# Patient Record
Sex: Female | Born: 1940
Health system: Southern US, Community
[De-identification: ages and names within clinical notes are randomized; demographics above are authoritative.]

## PROBLEM LIST (undated history)

## (undated) DIAGNOSIS — R0600 Dyspnea, unspecified: Secondary | ICD-10-CM

## (undated) DIAGNOSIS — E7800 Pure hypercholesterolemia, unspecified: Secondary | ICD-10-CM

## (undated) DIAGNOSIS — I509 Heart failure, unspecified: Secondary | ICD-10-CM

## (undated) DIAGNOSIS — G4730 Sleep apnea, unspecified: Secondary | ICD-10-CM

## (undated) DIAGNOSIS — I2510 Atherosclerotic heart disease of native coronary artery without angina pectoris: Secondary | ICD-10-CM

## (undated) DIAGNOSIS — D649 Anemia, unspecified: Secondary | ICD-10-CM

## (undated) DIAGNOSIS — R569 Unspecified convulsions: Secondary | ICD-10-CM

## (undated) DIAGNOSIS — I10 Essential (primary) hypertension: Secondary | ICD-10-CM

## (undated) DIAGNOSIS — E039 Hypothyroidism, unspecified: Secondary | ICD-10-CM

## (undated) DIAGNOSIS — N289 Disorder of kidney and ureter, unspecified: Secondary | ICD-10-CM

## (undated) DIAGNOSIS — K219 Gastro-esophageal reflux disease without esophagitis: Secondary | ICD-10-CM

## (undated) DIAGNOSIS — M1990 Unspecified osteoarthritis, unspecified site: Secondary | ICD-10-CM

## (undated) HISTORY — PX: BRAIN SURGERY: SHX531

## (undated) HISTORY — PX: ABDOMINAL HYSTERECTOMY: SHX81

---
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
## 2019-09-21 ENCOUNTER — Other Ambulatory Visit: Payer: Self-pay

## 2019-09-21 ENCOUNTER — Inpatient Hospital Stay (HOSPITAL_COMMUNITY)
Admission: EM | Admit: 2019-09-21 | Discharge: 2019-10-07 | DRG: 673 | Disposition: A | Discharge status: Home Health | Payer: Medicare Other | Attending: Family Medicine | Admitting: Family Medicine

## 2019-09-21 ENCOUNTER — Encounter (HOSPITAL_COMMUNITY): Payer: Self-pay | Admitting: Emergency Medicine

## 2019-09-21 DIAGNOSIS — E875 Hyperkalemia: Secondary | ICD-10-CM | POA: Diagnosis present

## 2019-09-21 DIAGNOSIS — R601 Generalized edema: Secondary | ICD-10-CM | POA: Diagnosis not present

## 2019-09-21 DIAGNOSIS — C9000 Multiple myeloma not having achieved remission: Secondary | ICD-10-CM

## 2019-09-21 DIAGNOSIS — R945 Abnormal results of liver function studies: Secondary | ICD-10-CM

## 2019-09-21 DIAGNOSIS — E039 Hypothyroidism, unspecified: Secondary | ICD-10-CM | POA: Diagnosis present

## 2019-09-21 DIAGNOSIS — R768 Other specified abnormal immunological findings in serum: Secondary | ICD-10-CM

## 2019-09-21 DIAGNOSIS — D8989 Other specified disorders involving the immune mechanism, not elsewhere classified: Secondary | ICD-10-CM

## 2019-09-21 DIAGNOSIS — N179 Acute kidney failure, unspecified: Principal | ICD-10-CM | POA: Diagnosis present

## 2019-09-21 DIAGNOSIS — E7800 Pure hypercholesterolemia, unspecified: Secondary | ICD-10-CM | POA: Diagnosis present

## 2019-09-21 DIAGNOSIS — I1310 Hypertensive heart and chronic kidney disease without heart failure, with stage 1 through stage 4 chronic kidney disease, or unspecified chronic kidney disease: Secondary | ICD-10-CM

## 2019-09-21 DIAGNOSIS — Z95828 Presence of other vascular implants and grafts: Secondary | ICD-10-CM

## 2019-09-21 DIAGNOSIS — Z419 Encounter for procedure for purposes other than remedying health state, unspecified: Secondary | ICD-10-CM

## 2019-09-21 DIAGNOSIS — Z452 Encounter for adjustment and management of vascular access device: Secondary | ICD-10-CM

## 2019-09-21 DIAGNOSIS — Z6839 Body mass index (BMI) 39.0-39.9, adult: Secondary | ICD-10-CM

## 2019-09-21 DIAGNOSIS — D649 Anemia, unspecified: Secondary | ICD-10-CM | POA: Diagnosis present

## 2019-09-21 DIAGNOSIS — Z20828 Contact with and (suspected) exposure to other viral communicable diseases: Secondary | ICD-10-CM | POA: Diagnosis present

## 2019-09-21 DIAGNOSIS — D539 Nutritional anemia, unspecified: Secondary | ICD-10-CM | POA: Diagnosis present

## 2019-09-21 DIAGNOSIS — G40909 Epilepsy, unspecified, not intractable, without status epilepticus: Secondary | ICD-10-CM | POA: Diagnosis present

## 2019-09-21 DIAGNOSIS — D631 Anemia in chronic kidney disease: Secondary | ICD-10-CM | POA: Diagnosis present

## 2019-09-21 DIAGNOSIS — J90 Pleural effusion, not elsewhere classified: Secondary | ICD-10-CM

## 2019-09-21 DIAGNOSIS — K219 Gastro-esophageal reflux disease without esophagitis: Secondary | ICD-10-CM | POA: Diagnosis present

## 2019-09-21 DIAGNOSIS — Z9889 Other specified postprocedural states: Secondary | ICD-10-CM

## 2019-09-21 DIAGNOSIS — G4733 Obstructive sleep apnea (adult) (pediatric): Secondary | ICD-10-CM | POA: Diagnosis present

## 2019-09-21 DIAGNOSIS — N184 Chronic kidney disease, stage 4 (severe): Secondary | ICD-10-CM | POA: Diagnosis present

## 2019-09-21 DIAGNOSIS — Z79899 Other long term (current) drug therapy: Secondary | ICD-10-CM

## 2019-09-21 DIAGNOSIS — I959 Hypotension, unspecified: Secondary | ICD-10-CM | POA: Diagnosis present

## 2019-09-21 DIAGNOSIS — Z90710 Acquired absence of both cervix and uterus: Secondary | ICD-10-CM

## 2019-09-21 DIAGNOSIS — Y832 Surgical operation with anastomosis, bypass or graft as the cause of abnormal reaction of the patient, or of later complication, without mention of misadventure at the time of the procedure: Secondary | ICD-10-CM | POA: Diagnosis not present

## 2019-09-21 DIAGNOSIS — E785 Hyperlipidemia, unspecified: Secondary | ICD-10-CM | POA: Diagnosis present

## 2019-09-21 DIAGNOSIS — D72829 Elevated white blood cell count, unspecified: Secondary | ICD-10-CM

## 2019-09-21 DIAGNOSIS — K761 Chronic passive congestion of liver: Secondary | ICD-10-CM | POA: Diagnosis present

## 2019-09-21 DIAGNOSIS — I130 Hypertensive heart and chronic kidney disease with heart failure and stage 1 through stage 4 chronic kidney disease, or unspecified chronic kidney disease: Secondary | ICD-10-CM | POA: Diagnosis present

## 2019-09-21 DIAGNOSIS — I509 Heart failure, unspecified: Secondary | ICD-10-CM

## 2019-09-21 DIAGNOSIS — E43 Unspecified severe protein-calorie malnutrition: Secondary | ICD-10-CM | POA: Diagnosis present

## 2019-09-21 DIAGNOSIS — T82868A Thrombosis of vascular prosthetic devices, implants and grafts, initial encounter: Secondary | ICD-10-CM | POA: Diagnosis not present

## 2019-09-21 DIAGNOSIS — J9601 Acute respiratory failure with hypoxia: Secondary | ICD-10-CM | POA: Diagnosis not present

## 2019-09-21 DIAGNOSIS — L03113 Cellulitis of right upper limb: Secondary | ICD-10-CM | POA: Diagnosis not present

## 2019-09-21 DIAGNOSIS — I2510 Atherosclerotic heart disease of native coronary artery without angina pectoris: Secondary | ICD-10-CM | POA: Diagnosis present

## 2019-09-21 DIAGNOSIS — I5033 Acute on chronic diastolic (congestive) heart failure: Secondary | ICD-10-CM

## 2019-09-21 DIAGNOSIS — N189 Chronic kidney disease, unspecified: Secondary | ICD-10-CM

## 2019-09-21 HISTORY — DX: Sleep apnea, unspecified: G47.30

## 2019-09-21 HISTORY — DX: Unspecified convulsions: R56.9

## 2019-09-21 HISTORY — DX: Hypothyroidism, unspecified: E03.9

## 2019-09-21 HISTORY — DX: Heart failure, unspecified: I50.9

## 2019-09-21 HISTORY — DX: Atherosclerotic heart disease of native coronary artery without angina pectoris: I25.10

## 2019-09-21 HISTORY — DX: Essential (primary) hypertension: I10

## 2019-09-21 HISTORY — DX: Disorder of kidney and ureter, unspecified: N28.9

## 2019-09-21 HISTORY — DX: Pure hypercholesterolemia, unspecified: E78.00

## 2019-09-21 NOTE — ED Triage Notes (Signed)
Per daughter pt sent from PCP for 30lb weight gain and "kidney failure." PCP stopped torsemide and losartan due to pt's kidney failure per daughter.

## 2019-09-22 ENCOUNTER — Inpatient Hospital Stay (HOSPITAL_COMMUNITY): Payer: Medicare Other

## 2019-09-22 ENCOUNTER — Encounter (HOSPITAL_COMMUNITY): Payer: Self-pay | Admitting: Internal Medicine

## 2019-09-22 ENCOUNTER — Emergency Department (HOSPITAL_COMMUNITY): Payer: Medicare Other

## 2019-09-22 DIAGNOSIS — E875 Hyperkalemia: Secondary | ICD-10-CM | POA: Diagnosis present

## 2019-09-22 DIAGNOSIS — R945 Abnormal results of liver function studies: Secondary | ICD-10-CM | POA: Diagnosis present

## 2019-09-22 DIAGNOSIS — I2510 Atherosclerotic heart disease of native coronary artery without angina pectoris: Secondary | ICD-10-CM | POA: Diagnosis present

## 2019-09-22 DIAGNOSIS — K761 Chronic passive congestion of liver: Secondary | ICD-10-CM | POA: Diagnosis present

## 2019-09-22 DIAGNOSIS — I509 Heart failure, unspecified: Secondary | ICD-10-CM | POA: Diagnosis not present

## 2019-09-22 DIAGNOSIS — I952 Hypotension due to drugs: Secondary | ICD-10-CM | POA: Diagnosis not present

## 2019-09-22 DIAGNOSIS — Z6839 Body mass index (BMI) 39.0-39.9, adult: Secondary | ICD-10-CM | POA: Diagnosis not present

## 2019-09-22 DIAGNOSIS — D649 Anemia, unspecified: Secondary | ICD-10-CM | POA: Diagnosis present

## 2019-09-22 DIAGNOSIS — T82868A Thrombosis of vascular prosthetic devices, implants and grafts, initial encounter: Secondary | ICD-10-CM | POA: Diagnosis not present

## 2019-09-22 DIAGNOSIS — E785 Hyperlipidemia, unspecified: Secondary | ICD-10-CM | POA: Diagnosis present

## 2019-09-22 DIAGNOSIS — I1310 Hypertensive heart and chronic kidney disease without heart failure, with stage 1 through stage 4 chronic kidney disease, or unspecified chronic kidney disease: Secondary | ICD-10-CM | POA: Diagnosis not present

## 2019-09-22 DIAGNOSIS — Z79899 Other long term (current) drug therapy: Secondary | ICD-10-CM | POA: Diagnosis not present

## 2019-09-22 DIAGNOSIS — C9000 Multiple myeloma not having achieved remission: Secondary | ICD-10-CM | POA: Diagnosis present

## 2019-09-22 DIAGNOSIS — E7800 Pure hypercholesterolemia, unspecified: Secondary | ICD-10-CM | POA: Diagnosis present

## 2019-09-22 DIAGNOSIS — I5033 Acute on chronic diastolic (congestive) heart failure: Secondary | ICD-10-CM | POA: Diagnosis present

## 2019-09-22 DIAGNOSIS — J90 Pleural effusion, not elsewhere classified: Secondary | ICD-10-CM | POA: Diagnosis not present

## 2019-09-22 DIAGNOSIS — G40909 Epilepsy, unspecified, not intractable, without status epilepticus: Secondary | ICD-10-CM | POA: Diagnosis present

## 2019-09-22 DIAGNOSIS — I340 Nonrheumatic mitral (valve) insufficiency: Secondary | ICD-10-CM | POA: Diagnosis not present

## 2019-09-22 DIAGNOSIS — E43 Unspecified severe protein-calorie malnutrition: Secondary | ICD-10-CM | POA: Diagnosis present

## 2019-09-22 DIAGNOSIS — G4733 Obstructive sleep apnea (adult) (pediatric): Secondary | ICD-10-CM | POA: Diagnosis present

## 2019-09-22 DIAGNOSIS — N185 Chronic kidney disease, stage 5: Secondary | ICD-10-CM | POA: Diagnosis not present

## 2019-09-22 DIAGNOSIS — L03113 Cellulitis of right upper limb: Secondary | ICD-10-CM | POA: Diagnosis not present

## 2019-09-22 DIAGNOSIS — E039 Hypothyroidism, unspecified: Secondary | ICD-10-CM | POA: Diagnosis present

## 2019-09-22 DIAGNOSIS — J9601 Acute respiratory failure with hypoxia: Secondary | ICD-10-CM | POA: Diagnosis not present

## 2019-09-22 DIAGNOSIS — R601 Generalized edema: Secondary | ICD-10-CM | POA: Diagnosis present

## 2019-09-22 DIAGNOSIS — Z20828 Contact with and (suspected) exposure to other viral communicable diseases: Secondary | ICD-10-CM | POA: Diagnosis present

## 2019-09-22 DIAGNOSIS — R609 Edema, unspecified: Secondary | ICD-10-CM | POA: Diagnosis not present

## 2019-09-22 DIAGNOSIS — I130 Hypertensive heart and chronic kidney disease with heart failure and stage 1 through stage 4 chronic kidney disease, or unspecified chronic kidney disease: Secondary | ICD-10-CM | POA: Diagnosis present

## 2019-09-22 DIAGNOSIS — D631 Anemia in chronic kidney disease: Secondary | ICD-10-CM | POA: Diagnosis present

## 2019-09-22 DIAGNOSIS — I50811 Acute right heart failure: Secondary | ICD-10-CM | POA: Diagnosis not present

## 2019-09-22 DIAGNOSIS — N189 Chronic kidney disease, unspecified: Secondary | ICD-10-CM | POA: Diagnosis not present

## 2019-09-22 DIAGNOSIS — D539 Nutritional anemia, unspecified: Secondary | ICD-10-CM | POA: Diagnosis present

## 2019-09-22 DIAGNOSIS — Y832 Surgical operation with anastomosis, bypass or graft as the cause of abnormal reaction of the patient, or of later complication, without mention of misadventure at the time of the procedure: Secondary | ICD-10-CM | POA: Diagnosis not present

## 2019-09-22 DIAGNOSIS — N179 Acute kidney failure, unspecified: Secondary | ICD-10-CM

## 2019-09-22 DIAGNOSIS — N184 Chronic kidney disease, stage 4 (severe): Secondary | ICD-10-CM | POA: Diagnosis not present

## 2019-09-22 DIAGNOSIS — K219 Gastro-esophageal reflux disease without esophagitis: Secondary | ICD-10-CM | POA: Diagnosis present

## 2019-09-22 DIAGNOSIS — I959 Hypotension, unspecified: Secondary | ICD-10-CM | POA: Diagnosis present

## 2019-09-22 DIAGNOSIS — N186 End stage renal disease: Secondary | ICD-10-CM | POA: Diagnosis not present

## 2019-09-22 LAB — COMPREHENSIVE METABOLIC PANEL
ALT: 47 U/L — ABNORMAL HIGH (ref 0–44)
ALT: 45 U/L — ABNORMAL HIGH (ref 0–44)
AST: 76 U/L — ABNORMAL HIGH (ref 15–41)
AST: 73 U/L — ABNORMAL HIGH (ref 15–41)
Albumin: 2.4 g/dL — ABNORMAL LOW (ref 3.5–5.0)
Albumin: 2.2 g/dL — ABNORMAL LOW (ref 3.5–5.0)
Alkaline Phosphatase: 145 U/L — ABNORMAL HIGH (ref 38–126)
Alkaline Phosphatase: 140 U/L — ABNORMAL HIGH (ref 38–126)
Anion gap: 13 (ref 5–15)
Anion gap: 14 (ref 5–15)
BUN: 74 mg/dL — ABNORMAL HIGH (ref 8–23)
BUN: 78 mg/dL — ABNORMAL HIGH (ref 8–23)
CO2: 19 mmol/L — ABNORMAL LOW (ref 22–32)
CO2: 19 mmol/L — ABNORMAL LOW (ref 22–32)
Calcium: 7.6 mg/dL — ABNORMAL LOW (ref 8.9–10.3)
Calcium: 7.6 mg/dL — ABNORMAL LOW (ref 8.9–10.3)
Chloride: 104 mmol/L (ref 98–111)
Chloride: 103 mmol/L (ref 98–111)
Creatinine, Ser: 4.85 mg/dL — ABNORMAL HIGH (ref 0.44–1.00)
Creatinine, Ser: 4.88 mg/dL — ABNORMAL HIGH (ref 0.44–1.00)
GFR calc Af Amer: 9 mL/min — ABNORMAL LOW (ref >60)
GFR calc Af Amer: 9 mL/min — ABNORMAL LOW (ref >60)
GFR calc non Af Amer: 8 mL/min — ABNORMAL LOW (ref >60)
GFR calc non Af Amer: 8 mL/min — ABNORMAL LOW (ref >60)
Glucose, Bld: 110 mg/dL — ABNORMAL HIGH (ref 70–99)
Glucose, Bld: 106 mg/dL — ABNORMAL HIGH (ref 70–99)
Potassium: 4.8 mmol/L (ref 3.5–5.1)
Potassium: 4.9 mmol/L (ref 3.5–5.1)
Sodium: 136 mmol/L (ref 135–145)
Sodium: 136 mmol/L (ref 135–145)
Total Bilirubin: 0.4 mg/dL (ref 0.3–1.2)
Total Bilirubin: 0.3 mg/dL (ref 0.3–1.2)
Total Protein: 5.3 g/dL — ABNORMAL LOW (ref 6.5–8.1)
Total Protein: 5 g/dL — ABNORMAL LOW (ref 6.5–8.1)

## 2019-09-22 LAB — CBC
HCT: 29.6 % — ABNORMAL LOW (ref 36.0–46.0)
HCT: 30.8 % — ABNORMAL LOW (ref 36.0–46.0)
Hemoglobin: 9.8 g/dL — ABNORMAL LOW (ref 12.0–15.0)
Hemoglobin: 9.7 g/dL — ABNORMAL LOW (ref 12.0–15.0)
MCH: 33 pg (ref 26.0–34.0)
MCH: 31.9 pg (ref 26.0–34.0)
MCHC: 33.1 g/dL (ref 30.0–36.0)
MCHC: 31.5 g/dL (ref 30.0–36.0)
MCV: 99.7 fL (ref 80.0–100.0)
MCV: 101.3 fL — ABNORMAL HIGH (ref 80.0–100.0)
Platelets: 193 10*3/uL (ref 150–400)
Platelets: 178 10*3/uL (ref 150–400)
RBC: 2.97 MIL/uL — ABNORMAL LOW (ref 3.87–5.11)
RBC: 3.04 MIL/uL — ABNORMAL LOW (ref 3.87–5.11)
RDW: 15.4 % (ref 11.5–15.5)
RDW: 15.3 % (ref 11.5–15.5)
WBC: 9.9 10*3/uL (ref 4.0–10.5)
WBC: 8.8 10*3/uL (ref 4.0–10.5)
nRBC: 0 % (ref 0.0–0.2)
nRBC: 0 % (ref 0.0–0.2)

## 2019-09-22 LAB — URINALYSIS, COMPLETE (UACMP) WITH MICROSCOPIC
Bilirubin Urine: NEGATIVE
Glucose, UA: NEGATIVE mg/dL
Ketones, ur: NEGATIVE mg/dL
Leukocytes,Ua: NEGATIVE
Nitrite: NEGATIVE
Protein, ur: >=300 mg/dL — AB
Specific Gravity, Urine: 1.023 (ref 1.005–1.030)
pH: 5 (ref 5.0–8.0)

## 2019-09-22 LAB — IRON AND TIBC
Iron: 57 ug/dL (ref 28–170)
Saturation Ratios: 27 % (ref 10.4–31.8)
TIBC: 208 ug/dL — ABNORMAL LOW (ref 250–450)
UIBC: 151 ug/dL

## 2019-09-22 LAB — PROTEIN / CREATININE RATIO, URINE
Creatinine, Urine: 205.93 mg/dL
Protein Creatinine Ratio: 10.1 mg/mg{Cre} — ABNORMAL HIGH (ref 0.00–0.15)
Total Protein, Urine: 2079 mg/dL

## 2019-09-22 LAB — CREATININE, URINE, RANDOM: Creatinine, Urine: 203.98 mg/dL

## 2019-09-22 LAB — CORTISOL: Cortisol, Plasma: 18.9 ug/dL

## 2019-09-22 LAB — ECHOCARDIOGRAM COMPLETE
Height: 68 in
Weight: 4096 oz

## 2019-09-22 LAB — SODIUM, URINE, RANDOM: Sodium, Ur: 14 mmol/L

## 2019-09-22 LAB — TSH
TSH: 6.776 u[IU]/mL — ABNORMAL HIGH (ref 0.350–4.500)
TSH: 6.835 u[IU]/mL — ABNORMAL HIGH (ref 0.350–4.500)

## 2019-09-22 LAB — SARS CORONAVIRUS 2 (TAT 6-24 HRS): SARS Coronavirus 2: NEGATIVE

## 2019-09-22 LAB — BRAIN NATRIURETIC PEPTIDE: B Natriuretic Peptide: 1148 pg/mL — ABNORMAL HIGH (ref 0.0–100.0)

## 2019-09-22 LAB — VITAMIN B12: Vitamin B-12: 696 pg/mL (ref 180–914)

## 2019-09-22 LAB — FERRITIN: Ferritin: 262 ng/mL (ref 11–307)

## 2019-09-22 MED ORDER — SODIUM CHLORIDE 0.9% FLUSH
3.0000 mL | Freq: Two times a day (BID) | INTRAVENOUS | Status: DC
  Administered 2019-09-22 – 2019-09-23 (×3): 3 mL via INTRAVENOUS
  Administered 2019-09-23: 10 mL via INTRAVENOUS
  Administered 2019-09-24 – 2019-10-06 (×25): 3 mL via INTRAVENOUS

## 2019-09-22 MED ORDER — SODIUM CHLORIDE 0.9% FLUSH: 3.0000 mL | INTRAVENOUS | Status: DC | PRN

## 2019-09-22 MED ORDER — HEPARIN SODIUM (PORCINE) 5000 UNIT/ML IJ SOLN
5000.0000 [IU] | Freq: Three times a day (TID) | INTRAMUSCULAR | Status: DC
  Administered 2019-09-22 – 2019-10-07 (×43): 5000 [IU] via SUBCUTANEOUS
  Filled 2019-09-22 (×44): qty 1

## 2019-09-22 MED ORDER — FUROSEMIDE 10 MG/ML IJ SOLN
100.0000 mg | Freq: Once | INTRAMUSCULAR | Status: AC
  Administered 2019-09-22: 100 mg via INTRAVENOUS
  Filled 2019-09-22: qty 12

## 2019-09-22 MED ORDER — NITROGLYCERIN 2 % TD OINT
0.5000 [in_us] | TOPICAL_OINTMENT | Freq: Once | TRANSDERMAL | Status: AC
  Administered 2019-09-22: 0.5 [in_us] via TOPICAL
  Filled 2019-09-22: qty 1

## 2019-09-22 MED ORDER — PHENYTOIN SODIUM EXTENDED 100 MG PO CAPS
300.0000 mg | ORAL_CAPSULE | Freq: Every day | ORAL | Status: DC
  Administered 2019-09-22 – 2019-10-06 (×15): 300 mg via ORAL
  Filled 2019-09-22 (×16): qty 3

## 2019-09-22 MED ORDER — PRO-STAT SUGAR FREE PO LIQD
30.0000 mL | Freq: Two times a day (BID) | ORAL | Status: DC
  Administered 2019-09-22 – 2019-10-06 (×24): 30 mL via ORAL
  Filled 2019-09-22 (×25): qty 30

## 2019-09-22 MED ORDER — SODIUM CHLORIDE 0.9 % IV SOLN
250.0000 mL | INTRAVENOUS | Status: DC | PRN
  Administered 2019-09-30 – 2019-10-06 (×8): 250 mL via INTRAVENOUS
  Administered 2019-10-07: 11:00:00 via INTRAVENOUS

## 2019-09-22 NOTE — H&P (Signed)
TRH H&P    Patient Demographics:    Sophia Mcmahon, is a 78 y.o. female  MRN: [id number]  DOB - 1941-11-26  Admit Date - 09/21/2019  Referring MD/NP/PA:  Rolland Porter  Outpatient Primary MD for the patient is Frances Maywood, FNP  Patient coming from:  home  Chief complaint-  ARF   HPI:    Sophia Mcmahon  is a 78 y.o. female, w hypertension, hyperlipidemia, CKD stage4, Hypothyroidism, Seizure do, presents with c/o weight gain and swelling for the past 4 weeks. Pt notes dietary indiscretion. Pt has been exercising less.  Pt was told to STOP Torsemide and Losartan by PCP and to go to ER due to acute renal failure.    In ED T 97.5, P 59 R 22, Bp 111/47  Pox 94% on RA Wt 116.1kg  CXR IMPRESSION: CHF. Cardiomegaly with moderate pleural effusions and pulmonary edema.  Wbc 9.9, Hgb 9.8, Plt 193 Na 136, K 4.8, Bun 74, Creatinie 4.85 Alb 2.4 Ast 76, Alt 47, Alk phos 145, T. Bili 0.4 BNP 1,148  Pt will be admitted for ARF, Anasarca, and severe protein calorie malnutrition and ? CHF     Review of systems:    In addition to the HPI above,  No Fever-chills, No Headache, No changes with Vision or hearing, No problems swallowing food or Liquids, No Chest pain, Cough or Shortness of Breath, No Abdominal pain, No Nausea or Vomiting, bowel movements are regular, No Blood in stool or Urine, No dysuria, No new skin rashes or bruises, No new joints pains-aches,  No new weakness, tingling, numbness in any extremity, No recent weight gain or loss, No polyuria, polydypsia or polyphagia, No significant Mental Stressors.  All other systems reviewed and are negative.    Past History of the following :    Past Medical History:  Diagnosis Date   High cholesterol    Hypertension    Renal disorder    Seizures ([place])       History reviewed. No pertinent surgical history.    Social History:      Social History   Tobacco Use   Smoking status: Never Smoker   Smokeless tobacco: Never Used  Substance Use Topics   Alcohol use: Never    Frequency: Never       Family History :    No family history on file. Pt is unable to provide   Home Medications:   Prior to Admission medications   Not on File  Unknown other than Losartan and Torsemide   Allergies:     Allergies  Allergen Reactions   Sulfa Antibiotics Hives     Physical Exam:   Vitals  Blood pressure (!) 97/44, pulse (!) 56, temperature (!) 97.5 F (36.4 C), temperature source Oral, resp. rate (!) 22, weight 116.1 kg, SpO2 90 %.  1.  General: Axox2 (person, place)  2. Psychiatric: euthymic  3. Neurologic: Nonfocal, cn2-12 intact, reflexes 2+ symmetric, diffuse with no clonus, motor 5/5 in all 4 ext  4. HEENMT:  Anicteric, pupils  1.58m symmetric, direct, consensual, near intact Neck: no jvd  5. Respiratory : CTAB  6. Cardiovascular : rrr s1, s2,   7. Gastrointestinal:  Abd: soft, nt, nd, +bs  8. Skin:  Ext: no c/c/  1+ edema  9.Musculoskeletal:  Good ROM    Data Review:    CBC Recent Labs  Lab 09/22/19 0121 09/22/19 0422  WBC 9.9 8.8  HGB 9.8* 9.7*  HCT 29.6* 30.8*  PLT 193 178  MCV 99.7 101.3*  MCH 33.0 31.9  MCHC 33.1 31.5  RDW 15.4 15.3   ------------------------------------------------------------------------------------------------------------------  Results for orders placed or performed during the hospital encounter of 09/21/19 (from the past 48 hour(s))  CBC     Status: Abnormal   Collection Time: 09/22/19  1:21 AM  Result Value Ref Range   WBC 9.9 4.0 - 10.5 K/uL   RBC 2.97 (L) 3.87 - 5.11 MIL/uL   Hemoglobin 9.8 (L) 12.0 - 15.0 g/dL   HCT 29.6 (L) 36.0 - 46.0 %   MCV 99.7 80.0 - 100.0 fL   MCH 33.0 26.0 - 34.0 pg   MCHC 33.1 30.0 - 36.0 g/dL   RDW 15.4 11.5 - 15.5 %   Platelets 193 150 - 400 K/uL   nRBC 0.0 0.0 - 0.2 %    Comment: [facility] [address], [place] [address] Comprehensive metabolic panel     Status: Abnormal   Collection Time: 09/22/19  1:21 AM  Result Value Ref Range   Sodium 136 135 - 145 mmol/L   Potassium 4.8 3.5 - 5.1 mmol/L   Chloride 104 98 - 111 mmol/L   CO2 19 (L) 22 - 32 mmol/L   Glucose, Bld 110 (H) 70 - 99 mg/dL   BUN 74 (H) 8 - 23 mg/dL   Creatinine, Ser 4.85 (H) 0.44 - 1.00 mg/dL   Calcium 7.6 (L) 8.9 - 10.3 mg/dL   Total Protein 5.3 (L) 6.5 - 8.1 g/dL   Albumin 2.4 (L) 3.5 - 5.0 g/dL   AST 76 (H) 15 - 41 U/L   ALT 47 (H) 0 - 44 U/L   Alkaline Phosphatase 145 (H) 38 - 126 U/L   Total Bilirubin 0.4 0.3 - 1.2 mg/dL   GFR calc non Af Amer 8 (L) >60 mL/min   GFR calc Af Amer 9 (L) >60 mL/min   Anion gap 13 5 - 15    Comment: [facility] [address], [place] [address] Brain natriuretic peptide     Status: Abnormal   Collection Time: 09/22/19  1:21 AM  Result Value Ref Range   B Natriuretic Peptide 1,148.0 (H) 0.0 - 100.0 pg/mL    Comment: [facility] at [facility] [address] [place] [address] CBC     Status: Abnormal   Collection Time: 09/22/19  4:22 AM  Result Value Ref Range   WBC 8.8 4.0 - 10.5 K/uL   RBC 3.04 (L) 3.87 - 5.11 MIL/uL   Hemoglobin 9.7 (L) 12.0 - 15.0 g/dL   HCT 30.8 (L) 36.0 - 46.0 %   MCV 101.3 (H) 80.0 - 100.0 fL   MCH 31.9 26.0 - 34.0 pg   MCHC 31.5 30.0 - 36.0 g/dL   RDW 15.3 11.5 - 15.5 %   Platelets 178 150 - 400 K/uL   nRBC 0.0 0.0 - 0.2 %    Comment: [facility] at [facility] [address], [place] [address]  Chemistries  Recent Labs  Lab 09/22/19 0121  NA 136  K 4.8  CL 104  CO2 19*  GLUCOSE 110*  BUN 74*  CREATININE 4.85*  CALCIUM 7.6*  AST 76*  ALT 47*  ALKPHOS 145*  BILITOT 0.4    ------------------------------------------------------------------------------------------------------------------  ------------------------------------------------------------------------------------------------------------------ GFR: CrCl cannot be calculated (Unknown ideal weight.). Liver Function Tests: Recent Labs  Lab 09/22/19 0121  AST 76*  ALT 47*  ALKPHOS 145*  BILITOT 0.4  PROT 5.3*  ALBUMIN 2.4*   No results for input(s): LIPASE, AMYLASE in the last 168 hours. No results for input(s): AMMONIA in the last 168 hours. Coagulation Profile: No results for input(s): INR, PROTIME in the last 168 hours. Cardiac Enzymes: No results for input(s): CKTOTAL, CKMB, CKMBINDEX, TROPONINI in the last 168 hours. BNP (last 3 results) No results for input(s): PROBNP in the last 8760 hours. HbA1C: No results for input(s): HGBA1C in the last 72 hours. CBG: No results for input(s): GLUCAP in the last 168 hours. Lipid Profile: No results for input(s): CHOL, HDL, LDLCALC, TRIG, CHOLHDL, LDLDIRECT in the last 72 hours. Thyroid Function Tests: No results for input(s): TSH, T4TOTAL, FREET4, T3FREE, THYROIDAB in the last 72 hours. Anemia Panel: No results for input(s): VITAMINB12, FOLATE, FERRITIN, TIBC, IRON, RETICCTPCT in the last 72 hours.  --------------------------------------------------------------------------------------------------------------- Urine analysis: No results found for: COLORURINE, APPEARANCEUR, LABSPEC, PHURINE, GLUCOSEU, HGBUR, BILIRUBINUR, KETONESUR, PROTEINUR, UROBILINOGEN, NITRITE, LEUKOCYTESUR    Imaging Results:    Dg Chest Port 1 View  Result Date: 09/22/2019 CLINICAL DATA:  Shortness of breath. History of CHF, stopped diuretics. 30 pound weight gain. EXAM: PORTABLE CHEST 1 VIEW COMPARISON:  None. FINDINGS: Cardiomegaly. There is moderate pulmonary edema. Hazy lung base opacities likely combination of pleural effusions and compressive atelectasis. No  pneumothorax or confluent airspace disease in the aerated lungs. Advanced degenerative change of both shoulders. IMPRESSION: CHF. Cardiomegaly with moderate pleural effusions and pulmonary edema. Electronically Signed   By: Keith Rake M.D.   On: 09/22/2019 00:40   nsr at 24, LAD, Q in v1-3, biphasic t in v5,6    Assessment & Plan:    Principal Problem:   ARF (acute renal failure) (HCC) Active Problems:   Protein-calorie malnutrition, severe (HCC)   Abnormal liver function   Anemia  ARF Check urine sodium, urine prot/creatinine , urine eosinophils Check spep, immunofixation Hydrate very gently  Check cmp in am  Abnormal liver function Check acute hepatitis panel Check abdominal ultrasound Check cmp in am  Hypertension, CKD stage4 Please find out her medications in AM Hydralazine 16m iv q6h prn sbp >160 STOP Torsemide STOP Losartan  Severe protein calorie malnutrition prostat 344mpo bid  Hypothyroidism Start levothyroxine 25 micrograms po qday  Anemia Check ferritin, iron, tibc, b12, folate Check cbc in am    DVT Prophylaxis-   heparin- SCDs   AM Labs Ordered, also please review Full Orders  Family Communication: Admission, patients condition and plan of care including tests being ordered have been discussed with the patient  who indicate understanding and agree with the plan and Code Status.  Code Status:  FULL CODE per patient and daughter  Admission status:   Inpatient: Based on patients clinical presentation and evaluation of above clinical data, I have made determination that patient meets Inpatient criteria at this time.  Pt will require evaluation of ARF, creatinine 4.88, as well as abnormal liver function.  Pt will require > 2 nites stay.    Time spent in minutes : 70  Jani Gravel M.D on 09/22/2019 at 4:42 AM

## 2019-09-22 NOTE — Progress Notes (Signed)
*  PRELIMINARY RESULTS* Echocardiogram 2D Echocardiogram has been performed.  Sophia Mcmahon 09/22/2019, 2:31 PM

## 2019-09-22 NOTE — Progress Notes (Signed)
Has not voided all day.  Foley inserted at 1500 and returned 100 mls  To start 24 hour urine.

## 2019-09-22 NOTE — Progress Notes (Addendum)
Questioned patient's level of care d/t concerns regarding her Creatinine of 5.88, BNP 1100, BPs that have been slightly hypotensive with MAPs below 65, the administration of 100mg  of IV Lasixs with no urine output reported per patient or per her chart, and tachypnea.  This was addressed with the Baldo Ash, ED RN, who felt that the patient was stable enough for transfer to tele bed.  Dr. Dyann Kief texted regarded concerns.

## 2019-09-22 NOTE — ED Provider Notes (Signed)
[facility] EMERGENCY DEPARTMENT Provider Note   CSN: [id number] Arrival date & time: 09/21/19  2214   Time seen 12:38 AM  History   Chief Complaint Chief Complaint  Patient presents with   Shortness of Breath    HPI Sophia Mcmahon is a 78 y.o. female.   Level 5 caveat for confusion per daughter  HPI daughter states patient started gaining fluid over the past month and she estimates she has gained about 30 pounds.  She noticed her starting to get shortness of breath on September 25 and she was seen by her primary care doctor on the 26.  He sent blood work on her and she found out today that her BUN and creatinine were elevated and her BMP was around 1200.  Patient states she just started feeling bad today but her daughter states that she is confused.  She has not felt well in a while.  She states about a year ago her nephrologist in [place] started her on torsemide 20 mg and she dropped weight from 260 pounds down to 226 pounds and had done well until recently.  She states her mother has sort of decompensated due to the COVID and social distancing.  Patient complains of some low back pain and states she did have some diffuse stomach pains today.  She denies any nausea or vomiting although she did have diarrhea last night and only one episode today.  She has dyspnea with minimal exertion but patient denies PND.  She does wear CPAP at night.  PCP Frances Maywood, FNP Nephrology Dr Marlowe Sax in [place], [place]  Past Medical History:  Diagnosis Date   High cholesterol    Hypertension    Renal disorder    Seizures [facility])     Patient Active Problem List   Diagnosis Date Noted   ARF (acute renal failure) ([place]) 09/22/2019   Protein-calorie malnutrition, severe ([place]) 09/22/2019   Abnormal liver function 09/22/2019   Anemia 09/22/2019    History reviewed. No pertinent surgical history.   OB History   No obstetric history on file.      Home Medications    Prior to  Admission medications   Not on File    Family History No family history on file.  Social History Social History   Tobacco Use   Smoking status: Never Smoker   Smokeless tobacco: Never Used  Substance Use Topics   Alcohol use: Never    Frequency: Never   Drug use: Never  lives at home GD moved in with her 4 months ago   Allergies   Sulfa antibiotics   Review of Systems Review of Systems  All other systems reviewed and are negative.    Physical Exam Updated Vital Signs BP (!) 97/44    Pulse (!) 56    Temp (!) 97.5 F (36.4 C) (Oral)    Resp (!) 22    Wt 116.1 kg    SpO2 90%   Physical Exam Vitals signs and nursing note reviewed.  Constitutional:      General: She is not in acute distress.    Appearance: Normal appearance. She is well-developed. She is not ill-appearing or toxic-appearing.  HENT:     Head: Normocephalic and atraumatic.     Right Ear: External ear normal.     Left Ear: External ear normal.     Nose: Nose normal. No mucosal edema or rhinorrhea.     Mouth/Throat:     Mouth: Mucous membranes are dry.  Dentition: No dental abscesses.     Pharynx: No uvula swelling.  Eyes:     Extraocular Movements: Extraocular movements intact.     Conjunctiva/sclera: Conjunctivae normal.     Pupils: Pupils are equal, round, and reactive to light.  Neck:     Musculoskeletal: Full passive range of motion without pain, normal range of motion and neck supple.  Cardiovascular:     Rate and Rhythm: Normal rate and regular rhythm.     Heart sounds: Normal heart sounds. No murmur. No friction rub. No gallop.   Pulmonary:     Effort: Pulmonary effort is normal. No respiratory distress.     Breath sounds: Decreased breath sounds present. No wheezing, rhonchi or rales.  Chest:     Chest wall: No tenderness or crepitus.  Abdominal:     General: Bowel sounds are normal. There is distension.     Palpations: Abdomen is soft.     Tenderness: There is no abdominal  tenderness. There is no guarding or rebound.  Musculoskeletal: Normal range of motion.        General: No tenderness.     Right lower leg: Edema present.     Left lower leg: Edema present.     Comments: Moves all extremities well.  Patient is noted to have pitting edema of her hands, upper extremities, lower extremities at least to the knees.  She does not have any pitting of her abdominal wall.  Skin:    General: Skin is warm and dry.     Coloration: Skin is not pale.     Findings: No erythema or rash.  Neurological:     General: No focal deficit present.     Mental Status: She is alert and oriented to person, place, and time.     Cranial Nerves: No cranial nerve deficit.  Psychiatric:        Mood and Affect: Mood normal. Mood is not anxious.        Speech: Speech normal.        Behavior: Behavior normal.        Thought Content: Thought content normal.      ED Treatments / Results  Labs (all labs ordered are listed, but only abnormal results are displayed) Results for orders placed or performed during the hospital encounter of 09/21/19  CBC  Result Value Ref Range   WBC 9.9 4.0 - 10.5 K/uL   RBC 2.97 (L) 3.87 - 5.11 MIL/uL   Hemoglobin 9.8 (L) 12.0 - 15.0 g/dL   HCT 29.6 (L) 36.0 - 46.0 %   MCV 99.7 80.0 - 100.0 fL   MCH 33.0 26.0 - 34.0 pg   MCHC 33.1 30.0 - 36.0 g/dL   RDW 15.4 11.5 - 15.5 %   Platelets 193 150 - 400 K/uL   nRBC 0.0 0.0 - 0.2 %  Comprehensive metabolic panel  Result Value Ref Range   Sodium 136 135 - 145 mmol/L   Potassium 4.8 3.5 - 5.1 mmol/L   Chloride 104 98 - 111 mmol/L   CO2 19 (L) 22 - 32 mmol/L   Glucose, Bld 110 (H) 70 - 99 mg/dL   BUN 74 (H) 8 - 23 mg/dL   Creatinine, Ser 4.85 (H) 0.44 - 1.00 mg/dL   Calcium 7.6 (L) 8.9 - 10.3 mg/dL   Total Protein 5.3 (L) 6.5 - 8.1 g/dL   Albumin 2.4 (L) 3.5 - 5.0 g/dL   AST 76 (H) 15 - 41 U/L   ALT  47 (H) 0 - 44 U/L   Alkaline Phosphatase 145 (H) 38 - 126 U/L   Total Bilirubin 0.4 0.3 - 1.2 mg/dL    GFR calc non Af Amer 8 (L) >60 mL/min   GFR calc Af Amer 9 (L) >60 mL/min   Anion gap 13 5 - 15  Brain natriuretic peptide  Result Value Ref Range   B Natriuretic Peptide 1,148.0 (H) 0.0 - 100.0 pg/mL    Laboratory interpretation all normal except anemia, marked renal insufficiency, elevation of LFTs consistent with passive congestion, elevation of BMP   EKG EKG Interpretation  Date/Time:  Thursday September 22 2019 00:10:41 EDT Ventricular Rate:  56 PR Interval:    QRS Duration: 158 QT Interval:  558 QTC Calculation: 539 R Axis:   55 Text Interpretation:  Sinus rhythm Prolonged PR interval IVCD, consider atypical LBBB No old tracing to compare Confirmed by Rolland Porter [id number]) on 09/22/2019 12:15:04 AM   Radiology Dg Chest Port 1 View  Result Date: 09/22/2019 CLINICAL DATA:  Shortness of breath. History of CHF, stopped diuretics. 30 pound weight gain. EXAM: PORTABLE CHEST 1 VIEW COMPARISON:  None. FINDINGS: Cardiomegaly. There is moderate pulmonary edema. Hazy lung base opacities likely combination of pleural effusions and compressive atelectasis. No pneumothorax or confluent airspace disease in the aerated lungs. Advanced degenerative change of both shoulders. IMPRESSION: CHF. Cardiomegaly with moderate pleural effusions and pulmonary edema. Electronically Signed   By: Keith Rake M.D.   On: 09/22/2019 00:40    Procedures .Critical Care Performed by: Rolland Porter, MD Authorized by: Rolland Porter, MD   Critical care provider statement:    Critical care time (minutes):  31   Critical care was necessary to treat or prevent imminent or life-threatening deterioration of the following conditions:  Cardiac failure and renal failure   Critical care was time spent personally by me on the following activities:  Discussions with consultants, examination of patient, obtaining history from patient or surrogate, ordering and review of laboratory studies, ordering and review of radiographic  studies, re-evaluation of patient's condition and review of old charts   (including critical care time)          Medications Ordered in ED Medications  sodium chloride flush (NS) 0.9 % injection 3 mL (has no administration in time range)  0.9 %  sodium chloride infusion (has no administration in time range)  furosemide (LASIX) injection 100 mg (100 mg Intravenous Given 09/22/19 0304)  nitroGLYCERIN (NITROGLYN) 2 % ointment 0.5 inch (0.5 inches Topical Given 09/22/19 0304)     Initial Impression / Assessment and Plan / ED Course  I have reviewed the triage vital signs and the nursing notes.  Pertinent labs & imaging results that were available during my care of the patient were reviewed by me and considered in my medical decision making (see chart for details).       Patient presents with anasarca, after reviewing her labs she was given Lasix 100 mg IV, IV Bumex is on back order.  Her blood pressure was not extremely elevated so she was only given 1/2 inch of nitroglycerin paste.  Daughter does not know what patient's baseline creatinine was several months ago but it is worse tonight compared to this weekend of course it is a different lab that did the blood work.  Patient needs admission for diuresis and they are agreeable.  03:24 AM Dr Maudie Mercury, hospitalist, will admit for Anasarca  Final Clinical Impressions(s) / ED Diagnoses   Final diagnoses:  Anasarca  Acute renal failure superimposed on chronic kidney disease, unspecified CKD stage, unspecified acute renal failure type ([place])  Acute on chronic congestive heart failure, unspecified heart failure type [facility])    Plan admission  Rolland Porter, MD, Barbette Or, MD 09/22/19 [phone]

## 2019-09-22 NOTE — ED Notes (Signed)
ekg given to dr Tomi Bamberger

## 2019-09-22 NOTE — Plan of Care (Signed)
  Problem: Clinical Measurements: Goal: Diagnostic test results will improve Outcome: Not Progressing   Problem: Clinical Measurements: Goal: Cardiovascular complication will be avoided Outcome: Not Progressing   Problem: Activity: Goal: Risk for activity intolerance will decrease Outcome: Not Progressing   Problem: Nutrition: Goal: Adequate nutrition will be maintained Outcome: Not Progressing

## 2019-09-22 NOTE — Progress Notes (Signed)
BP  92/42 pulse 59 and right arm 89/47 pulse 58..  Talked to Dr. Edmonia James and Dr. Dyann Kief

## 2019-09-22 NOTE — Progress Notes (Signed)
Patient seen and examined.  Admitted after midnight secondary to increased weight gain and worsening renal function.  Patient blood pressure has remained soft after acute intervention of IV Lasix and nitroglycerin paste.  She denies any chest pain and is afebrile.  No nausea, no vomiting.  Has been reported since admission.  Bladder scan with 230 mL.  Please refer to H&P written by Dr. Jani Gravel further info/details on admission.  Plan: -Continue to follow renal function trend Cardiology service and nephrology service has been consulted for further assistance in patient's management; high concern for cardiorenal syndrome in the setting of acute on chronic heart failure. -If patient blood pressure remains low will require to be transferred to stepdown for initiation of milrinone and/or dopamine drip. -follow renal US -if > 260 Ml documented on repeat bladder scan will place foley.  Barton Dubois MD [phone]

## 2019-09-22 NOTE — Consult Note (Signed)
Reason for Consult: AKI Referring Physician: Dyann Kief, MD  Sophia Mcmahon is an 78 y.o. female.  HPI: [name] has a PMH significant for CAD, CHF, HLD, HTN, OSA, obesity, and CKD (unknown stage as she receives care in [place], [place]) who was noted to have a 30lb weight gain over the past 6 weeks and was seen by her PCP on 09/17/19.  Labs drawn at that time were significant for Cr of 3.8.  Her torsemide was increased and recheck on 09/21/19 revealed a Cr of 4.63.  She was told to stop her losartan and torsemide and go to the ED due to ARF but [facility] was on diversion and she was brought to [facility] ED last night.  She denies any N/V/D, or use of NSAIDs.  She does not know what her baseline Cr is but has been followed by Nephrology for the past year.  She reports her breathing is improved overnight.  She did receive IV lasix 100 mg and NTG paste in the ED which did drop her BP.  Trend in Creatinine: Creatinine, Ser  Date/Time Value Ref Range Status  09/22/2019 04:22 AM 4.88 (H) 0.44 - 1.00 mg/dL Final  09/22/2019 01:21 AM 4.85 (H) 0.44 - 1.00 mg/dL Final    PMH:   Past Medical History:  Diagnosis Date  . CHF (congestive heart failure) ([place])   . Coronary artery disease   . Dyspnea   . High cholesterol   . Hypertension   . Hypothyroidism   . Renal disorder   . Seizures ([place])   . Sleep apnea     PSH:  History reviewed. No pertinent surgical history.  Allergies:  Allergies  Allergen Reactions  . Sulfa Antibiotics Hives    Medications:   Prior to Admission medications   Not on File    Inpatient medications: . feeding supplement (PRO-STAT SUGAR FREE 64)  30 mL Oral BID  . heparin  5,000 Units Subcutaneous Q8H  . sodium chloride flush  3 mL Intravenous Q12H    Discontinued Meds:  There are no discontinued medications.  Social History:  reports that she has never smoked. She has never used smokeless tobacco. She reports that she does not drink alcohol or use drugs.  Family  History:  History reviewed. No pertinent family history.  Pertinent items are noted in HPI. Weight change:  No intake or output data in the 24 hours ending 09/22/19 1000 BP (!) 107/41 (BP Location: Left Arm)   Pulse (!) 56   Temp (!) 97.4 F (36.3 C) (Oral)   Resp (!) 24   Ht 5\' 8"  (1.727 m)   Wt 116.1 kg   SpO2 95%   BMI 38.92 kg/m  Vitals:   09/22/19 0555 09/22/19 0603 09/22/19 0613 09/22/19 0715  BP:  (!) 107/41    Pulse: 64 (!) 56    Resp: (!) 24     Temp: (!) 97.4 F (36.3 C)     TempSrc: Oral     SpO2: 95%   95%  Weight:      Height:   5\' 8"  (1.727 m)      General appearance: alert, cooperative and no distress Head: Normocephalic, without obvious abnormality, atraumatic Resp: diminished breath sounds bibasilar Cardio: regular rate and rhythm, S1, S2 normal, no murmur, click, rub or gallop GI: soft, non-tender; bowel sounds normal; no masses,  no organomegaly Extremities: edema 1+  Labs: Basic Metabolic Panel: Recent Labs  Lab 09/22/19 0121 09/22/19 0422  NA 136 136  K 4.8 4.9  CL 104 103  CO2 19* 19*  GLUCOSE 110* 106*  BUN 74* 78*  CREATININE 4.85* 4.88*  ALBUMIN 2.4* 2.2*  CALCIUM 7.6* 7.6*   Liver Function Tests: Recent Labs  Lab 09/22/19 0121 09/22/19 0422  AST 76* 73*  ALT 47* 45*  ALKPHOS 145* 140*  BILITOT 0.4 0.3  PROT 5.3* 5.0*  ALBUMIN 2.4* 2.2*   No results for input(s): LIPASE, AMYLASE in the last 168 hours. No results for input(s): AMMONIA in the last 168 hours. CBC: Recent Labs  Lab 09/22/19 0121 09/22/19 0422  WBC 9.9 8.8  HGB 9.8* 9.7*  HCT 29.6* 30.8*  MCV 99.7 101.3*  PLT 193 178   PT/INR: @LABRCNTIP (inr:5) Cardiac Enzymes: )No results for input(s): CKTOTAL, CKMB, CKMBINDEX, TROPONINI in the last 168 hours. CBG: No results for input(s): GLUCAP in the last 168 hours.  Iron Studies:  Recent Labs  Lab 09/22/19 0121  IRON 57  TIBC 208*  FERRITIN 262    Xrays/Other Studies: Dg Chest Port 1 View  Result  Date: 09/22/2019 CLINICAL DATA:  Shortness of breath. History of CHF, stopped diuretics. 30 pound weight gain. EXAM: PORTABLE CHEST 1 VIEW COMPARISON:  None. FINDINGS: Cardiomegaly. There is moderate pulmonary edema. Hazy lung base opacities likely combination of pleural effusions and compressive atelectasis. No pneumothorax or confluent airspace disease in the aerated lungs. Advanced degenerative change of both shoulders. IMPRESSION: CHF. Cardiomegaly with moderate pleural effusions and pulmonary edema. Electronically Signed   By: Keith Rake M.D.   On: 09/22/2019 00:40     Assessment/Plan: 1.  AKI/CKD- unclear baseline Scr but now with AKI in setting of decompensated CHF with escalation of diuresis with concomitant ARB therapy.  ARB has been held and likely due to cardiorenal vs ischemic ATN.   Will try to get more records from family and physicians, although her PCP is out of the office until Monday.  No indication for dialysis at this time and will continue to follow. 1. Check renal US to r/o obstruction 2. Obtain records from her primary nephrologist 3. Start acute GN workup and r/o nephrotic syndrome. 2. Acute on chronic CHF- currently breathing more comfortably.  Awaiting ECHO results and recommend cardiology consult to r/o cardiogenic shock. 3. Hypotension- hold meds for now and follow.  Check cortisol and would panculture 4. Severe protein malnutrition-  Low albumin.  Will order 24 hour urine for protein to r/o nephrotic syndrome. 5. Anemia - possibly due to CKD.  Will check iron stores and transfuse prn. 6. OSA on CPAP- cont with each night 7. Abnormal LFT's- possibly due to hypotension 8. Seizure disorder- h/o "brain surgery" and takes dilantin.   Broadus John A Vern Guerette 09/22/2019, 10:00 AM

## 2019-09-23 DIAGNOSIS — I130 Hypertensive heart and chronic kidney disease with heart failure and stage 1 through stage 4 chronic kidney disease, or unspecified chronic kidney disease: Secondary | ICD-10-CM

## 2019-09-23 DIAGNOSIS — I1310 Hypertensive heart and chronic kidney disease without heart failure, with stage 1 through stage 4 chronic kidney disease, or unspecified chronic kidney disease: Secondary | ICD-10-CM

## 2019-09-23 DIAGNOSIS — I50811 Acute right heart failure: Secondary | ICD-10-CM

## 2019-09-23 DIAGNOSIS — R601 Generalized edema: Secondary | ICD-10-CM

## 2019-09-23 DIAGNOSIS — I5033 Acute on chronic diastolic (congestive) heart failure: Secondary | ICD-10-CM

## 2019-09-23 LAB — COMPREHENSIVE METABOLIC PANEL
ALT: 45 U/L — ABNORMAL HIGH (ref 0–44)
AST: 73 U/L — ABNORMAL HIGH (ref 15–41)
Albumin: 2.1 g/dL — ABNORMAL LOW (ref 3.5–5.0)
Alkaline Phosphatase: 134 U/L — ABNORMAL HIGH (ref 38–126)
Anion gap: 10 (ref 5–15)
BUN: 81 mg/dL — ABNORMAL HIGH (ref 8–23)
CO2: 22 mmol/L (ref 22–32)
Calcium: 7.3 mg/dL — ABNORMAL LOW (ref 8.9–10.3)
Chloride: 103 mmol/L (ref 98–111)
Creatinine, Ser: 5.49 mg/dL — ABNORMAL HIGH (ref 0.44–1.00)
GFR calc Af Amer: 8 mL/min — ABNORMAL LOW (ref >60)
GFR calc non Af Amer: 7 mL/min — ABNORMAL LOW (ref >60)
Glucose, Bld: 100 mg/dL — ABNORMAL HIGH (ref 70–99)
Potassium: 5.3 mmol/L — ABNORMAL HIGH (ref 3.5–5.1)
Sodium: 135 mmol/L (ref 135–145)
Total Bilirubin: 0.3 mg/dL (ref 0.3–1.2)
Total Protein: 4.6 g/dL — ABNORMAL LOW (ref 6.5–8.1)

## 2019-09-23 LAB — GLUCOSE, CAPILLARY: Glucose-Capillary: 109 mg/dL — ABNORMAL HIGH (ref 70–99)

## 2019-09-23 LAB — CBC
HCT: 28.6 % — ABNORMAL LOW (ref 36.0–46.0)
Hemoglobin: 9.1 g/dL — ABNORMAL LOW (ref 12.0–15.0)
MCH: 32.7 pg (ref 26.0–34.0)
MCHC: 31.8 g/dL (ref 30.0–36.0)
MCV: 102.9 fL — ABNORMAL HIGH (ref 80.0–100.0)
Platelets: 175 10*3/uL (ref 150–400)
RBC: 2.78 MIL/uL — ABNORMAL LOW (ref 3.87–5.11)
RDW: 15.5 % (ref 11.5–15.5)
WBC: 9.6 10*3/uL (ref 4.0–10.5)
nRBC: 0 % (ref 0.0–0.2)

## 2019-09-23 LAB — KAPPA/LAMBDA LIGHT CHAINS
Kappa free light chain: 74.6 mg/L — ABNORMAL HIGH (ref 3.3–19.4)
Kappa, lambda light chain ratio: 0.07 — ABNORMAL LOW (ref 0.26–1.65)
Lambda free light chains: 1062 mg/L — ABNORMAL HIGH (ref 5.7–26.3)

## 2019-09-23 LAB — GLOMERULAR BASEMENT MEMBRANE ANTIBODIES: GBM Ab: 3 units (ref 0–20)

## 2019-09-23 LAB — HEPATITIS PANEL, ACUTE
HCV Ab: NONREACTIVE
Hep A IgM: NONREACTIVE
Hep B C IgM: NONREACTIVE
Hepatitis B Surface Ag: NONREACTIVE

## 2019-09-23 LAB — ANTI-DNA ANTIBODY, DOUBLE-STRANDED: ds DNA Ab: <1 IU/mL (ref 0–9)

## 2019-09-23 LAB — PROTEIN, URINE, 24 HOUR
Collection Interval-UPROT: 24 hours
Protein, 24H Urine: 5602 mg/d — ABNORMAL HIGH (ref 50–100)
Protein, Urine: 2037 mg/dL
Urine Total Volume-UPROT: 275 mL

## 2019-09-23 LAB — C4 COMPLEMENT: Complement C4, Body Fluid: 19 mg/dL (ref 14–44)

## 2019-09-23 LAB — MRSA PCR SCREENING: MRSA by PCR: NEGATIVE

## 2019-09-23 LAB — ANTISTREPTOLYSIN O TITER: ASO: <20 IU/mL (ref 0.0–200.0)

## 2019-09-23 LAB — FOLATE RBC
Folate, Hemolysate: 230 ng/mL
Folate, RBC: 774 ng/mL (ref >498)
Hematocrit: 29.7 % — ABNORMAL LOW (ref 34.0–46.6)

## 2019-09-23 LAB — C3 COMPLEMENT: C3 Complement: 104 mg/dL (ref 82–167)

## 2019-09-23 MED ORDER — ONDANSETRON HCL 4 MG/2ML IJ SOLN: 4.0000 mg | Freq: Four times a day (QID) | INTRAMUSCULAR | Status: DC | PRN

## 2019-09-23 MED ORDER — CHLORHEXIDINE GLUCONATE CLOTH 2 % EX PADS
6.0000 | MEDICATED_PAD | Freq: Every day | CUTANEOUS | Status: DC
  Administered 2019-09-23 – 2019-10-02 (×9): 6 via TOPICAL

## 2019-09-23 MED ORDER — LEVOTHYROXINE SODIUM 25 MCG PO TABS
25.0000 ug | ORAL_TABLET | Freq: Every day | ORAL | Status: DC
  Administered 2019-09-23 – 2019-10-07 (×15): 25 ug via ORAL
  Filled 2019-09-23 (×15): qty 1

## 2019-09-23 MED ORDER — PANTOPRAZOLE SODIUM 40 MG PO TBEC
40.0000 mg | DELAYED_RELEASE_TABLET | Freq: Every day | ORAL | Status: DC
  Administered 2019-09-24 – 2019-10-07 (×14): 40 mg via ORAL
  Filled 2019-09-23: qty 1
  Filled 2019-09-23: qty 2
  Filled 2019-09-23 (×12): qty 1

## 2019-09-23 MED ORDER — DOPAMINE-DEXTROSE 3.2-5 MG/ML-% IV SOLN
2.5000 ug/kg/min | INTRAVENOUS | Status: DC
  Administered 2019-09-23: 2.5 ug/kg/min via INTRAVENOUS
  Filled 2019-09-23 (×2): qty 250

## 2019-09-23 NOTE — Progress Notes (Signed)
Dr. Maudie Mercury notified of pt remaining hypotensive and my concerns of hypoperfusion of her kidneys d/t consistently low MAPs.  Orders given to start dopamine.  No stepdown/ICU bed available at this time.  Patient on tele.  Tele notified to watch rate closely and call with any changes.  Patient AOx4 and was educated on the purpose of the drip and any potential side effects she needs to tell me about (shortness of breath, palpitations, anxiety, chest pain/pressure,  Headaches, nausea). Daughter called and updated on plans to move patient downstairs when bed becomes available.  2 new PIVs initiated with good blood return.  #20 L H and #20 to L FA.

## 2019-09-23 NOTE — Progress Notes (Signed)
Patient ID: Sophia Mcmahon, female   DOB: 01/16/1941, 78 y.o.   MRN: [id number] S: Events of last 24 hours noted, now on pressors and transferring to ICU.  No significant UOP and reported N/V this am O:BP (!) 104/49 (BP Location: Right Arm)   Pulse 63   Temp 98.2 F (36.8 C)   Resp 16   Ht 5\' 8"  (1.727 m)   Wt 117.8 kg   SpO2 99%   BMI 39.49 kg/m   Intake/Output Summary (Last 24 hours) at 09/23/2019 0913 Last data filed at 09/23/2019 8119 Gross per 24 hour  Intake 240 ml  Output -  Net 240 ml   Intake/Output: I/O last 3 completed shifts: In: 240 [P.O.:240] Out: -   Intake/Output this shift:  No intake/output data recorded. Weight change: 1.679 kg Gen: fatigued appearing lying in bed in NAD CVS: no rub Resp: decreased BS at bases Abd: +BS, soft, NT/ND Ext: 2+ edema/anasarca  Recent Labs  Lab 09/22/19 0121 09/22/19 0422 09/23/19 0425  NA 136 136 135  K 4.8 4.9 5.3*  CL 104 103 103  CO2 19* 19* 22  GLUCOSE 110* 106* 100*  BUN 74* 78* 81*  CREATININE 4.85* 4.88* 5.49*  ALBUMIN 2.4* 2.2* 2.1*  CALCIUM 7.6* 7.6* 7.3*  AST 76* 73* 73*  ALT 47* 45* 45*   Liver Function Tests: Recent Labs  Lab 09/22/19 0121 09/22/19 0422 09/23/19 0425  AST 76* 73* 73*  ALT 47* 45* 45*  ALKPHOS 145* 140* 134*  BILITOT 0.4 0.3 0.3  PROT 5.3* 5.0* 4.6*  ALBUMIN 2.4* 2.2* 2.1*   No results for input(s): LIPASE, AMYLASE in the last 168 hours. No results for input(s): AMMONIA in the last 168 hours. CBC: Recent Labs  Lab 09/22/19 0121 09/22/19 0422 09/23/19 0425  WBC 9.9 8.8 9.6  HGB 9.8* 9.7* 9.1*  HCT 29.6* 30.8* 28.6*  MCV 99.7 101.3* 102.9*  PLT 193 178 175   Cardiac Enzymes: No results for input(s): CKTOTAL, CKMB, CKMBINDEX, TROPONINI in the last 168 hours. CBG: No results for input(s): GLUCAP in the last 168 hours.  Iron Studies:  Recent Labs    09/22/19 0121  IRON 57  TIBC 208*  FERRITIN 262   Studies/Results: US Abdomen Complete  Result Date:  09/22/2019 CLINICAL DATA:  Acute renal failure EXAM: ABDOMEN ULTRASOUND COMPLETE COMPARISON:  None. FINDINGS: Gallbladder: No gallstones or wall thickening visualized. No sonographic Murphy sign noted by sonographer. Common bile duct: Diameter: Not visualized Liver: No focal lesion identified. Within normal limits in parenchymal echogenicity. Portal vein is patent on color Doppler imaging with normal direction of blood flow towards the liver. IVC: No abnormality visualized. Pancreas: Visualized portion unremarkable. Spleen: Not visualized Right Kidney: Length: 10.5 cm, difficult to visualize. Increased echotexture diffusely. No hydronephrosis seen. Left Kidney: Length: Not visualized. Abdominal aorta: No aneurysm visualized. Other findings: Moderate right pleural effusion noted. Small amount of ascites seen. IMPRESSION: Limited study due to bowel gas and body habitus. Right kidney visualized and appears echogenic suggesting chronic medical renal disease. Left kidney not visualized. Small amount of ascites.  Right pleural effusion. Electronically Signed   By: Rolm Baptise M.D.   On: 09/22/2019 11:17   Dg Chest Port 1 View  Result Date: 09/22/2019 CLINICAL DATA:  Shortness of breath. History of CHF, stopped diuretics. 30 pound weight gain. EXAM: PORTABLE CHEST 1 VIEW COMPARISON:  None. FINDINGS: Cardiomegaly. There is moderate pulmonary edema. Hazy lung base opacities likely combination of pleural effusions and compressive atelectasis.  No pneumothorax or confluent airspace disease in the aerated lungs. Advanced degenerative change of both shoulders. IMPRESSION: CHF. Cardiomegaly with moderate pleural effusions and pulmonary edema. Electronically Signed   By: Keith Rake M.D.   On: 09/22/2019 00:40   . Chlorhexidine Gluconate Cloth  6 each Topical Daily  . feeding supplement (PRO-STAT SUGAR FREE 64)  30 mL Oral BID  . heparin  5,000 Units Subcutaneous Q8H  . levothyroxine  25 mcg Oral Q0600  . phenytoin   300 mg Oral QHS  . sodium chloride flush  3 mL Intravenous Q12H    BMET    Component Value Date/Time   NA 135 09/23/2019 0425   K 5.3 (H) 09/23/2019 0425   CL 103 09/23/2019 0425   CO2 22 09/23/2019 0425   GLUCOSE 100 (H) 09/23/2019 0425   BUN 81 (H) 09/23/2019 0425   CREATININE 5.49 (H) 09/23/2019 0425   CALCIUM 7.3 (L) 09/23/2019 0425   GFRNONAA 7 (L) 09/23/2019 0425   GFRAA 8 (L) 09/23/2019 0425   CBC    Component Value Date/Time   WBC 9.6 09/23/2019 0425   RBC 2.78 (L) 09/23/2019 0425   HGB 9.1 (L) 09/23/2019 0425   HCT 28.6 (L) 09/23/2019 0425   PLT 175 09/23/2019 0425   MCV 102.9 (H) 09/23/2019 0425   MCH 32.7 09/23/2019 0425   MCHC 31.8 09/23/2019 0425   RDW 15.5 09/23/2019 0425   Assessment/Plan: 1.  AKI/CKD- unclear baseline Scr but now with AKI in setting of decompensated CHF with escalation of diuresis with concomitant ARB therapy.  ARB has been held and likely due to cardiorenal vs ischemic ATN.   Will try to get more records from family and physicians, although her PCP is out of the office until Monday.  No indication for dialysis at this time and will continue to follow. 1. Renal US without obstruction but unable to visualize left kidney, recommend CT scan of abd/pelvis.  No history of nephrectomy 2. Obtain records from her primary nephrologist 3. Started acute GN workup and r/o nephrotic syndrome. 4. Low FeNa consistent with cardiorenal syndrome or acute GN 5. Pt with hypotension, oliguria/anuria, and worsening renal function as well as hyperkalemia.  Will need to transfer to [facility] ICU to initiate CRRT.  Discussed with Dr. Dyann Kief and Sophia Mcmahon, both of whom are in agreement. 2. Acute on chronic CHF- currently breathing more comfortably.  ECHO results noted without etiology for hypotension.  Recommend cardiology consult for further evaluation.  3. Hypotension- hold bp meds for now and currently on dopamine. 1. Cortisol level was normal and recommend  panculture 4. Severe protein malnutrition-  Low albumin.  Will order 24 hour urine for protein to r/o nephrotic syndrome. 5. Anemia - possibly due to CKD.  Will check iron stores and transfuse prn. 6. OSA on CPAP- cont with each night 7. Abnormal LFT's- possibly due to hypotension.  Will check hepatitis panel 8. Seizure disorder- h/o "brain surgery" and takes dilantin per primary svc  Donetta Potts, MD [facility] [phone]

## 2019-09-23 NOTE — H&P (Signed)
NAME:  Sophia Mcmahon, MRN:  [id number], DOB:  [birth year], LOS: 1 ADMISSION DATE:  09/21/2019, CONSULTATION DATE:  NA REFERRING MD:  Dyann Kief, CHIEF COMPLAINT:   Acute diastolic heart failure , And acute on chronic renal failure  Brief History   78 year old female admitted multiple medical comorbidities as listed below.  Being transferred from [facility] where she was admitted on 9/30 to [facility] on 10/2 in the setting of progressive cardiorenal syndrome, hypotension, and worsening acute on chronic renal failure.  History of present illness   78 year old female who was admitted initially to [facility] on 9/30 after being sent To the emergency room by her primary care provider for rising creatinine and 30 pound weight gain over a 6-week period.  She had been apparently followed in the outside setting initially seen by primary care provider on 9/26 with a serum creatinine of 3.8 at which time her torsemide was increased a recheck serum creatinine on 9/30 demonstrated a creatinine of 4.63.  She was admitted to the hospital, initial therapeutic interventions included: Gentle hydration, holding losartan and torsemide, and obtaining nephrology consultation.  She is seen by nephrology on 10/1 who felt her acute on chronic renal failure was the result of decompensated congestive heart failure, escalated diuresis and ARB therapy.  Renal ultrasound was ordered, laboratory work-up for glomerular nephrosis and nephrotic syndrome initiated and IV therapy continued.  The following day on 10/2 weight was up 1.67 kg serum creatinine had risen from 4.88-5.49 with a potassium now registered at 5.3.  Renal ultrasound suggested chronic medical renal disease, however they were unable to evaluate the left kidney, urine studies to date with low FENa consistent with cardiorenal syndrome or acute GN.  She was placed on dopamine infusion, antihypertensives held, transferring to [facility] for CRRT.  Upon arrival  to [facility], patient hemodynamics have improved. She is now off dopamine and off of supplemental O2.   Past Medical History  Coronary artery disease, obesity, obstructive sleep apnea, CKD (stage unknown), hypertension, hyperlipidemia.  Significant Hospital Events    9/30 demonstrated a creatinine of 4.63.  Admitted to the hospital, initial therapeutic interventions included: Gentle hydration, holding losartan and torsemide, and obtaining nephrology consultation.  10/1 who felt her acute on chronic renal failure was the result of decompensated congestive heart failure, escalated diuresis and ARB therapy.  IV therapy continued.   10/2 weight was up 1.67 kg serum creatinine had risen from 4.88-5.49; K 5. 3,  low FENa consistent with cardiorenal syndrome or acute GN.  She was placed on dopamine infusion, antihypertensives held, transfer process to [facility] initiated for CRRT need 10/3 arrives to [facility]. Weaned off dopamine, supplemental O2. iHd cath to be placed   Consults:  Renal 10/1  Cardiology 10/2  Procedures:   10/3> Trialysis catheter  Significant Diagnostic Tests:   Renal ultrasound 10/1: suggested chronic medical renal disease, however they were unable to evaluate the left kidney,  Echo 62/1:  LV systolic function is normal and actually hyperdynamic, RV systolic function is normal. She has grade II diastolic dysfunction with elevated LA pressure and severe biatrial enlargement, no significant valvuler abnormalities. Her low bp's are not cardiac related. With normal RV and LV function   GN eval 10/1:  Urine immunofixation>>> Anti-DNA antibody>>>  antistreptolysin O titer>>> Glomerular basement membrane antibodies>>> Total complement>>> C4> 19 C3>104 MPO/PR-3>> ANA,IFA (w/ reflex) UPEP/UIFE/Light chains/TP 24hr urine >> 24 hr protein>>> Protein electrophoresis >>> Kappa/lambda light chain ratio> 0.07 Kappa free  light chain> 74.6 (H) Lambda free light chain > 1062 (H)     Micro Data:  COVID 10/1: neg   Antimicrobials:    Interim history/subjective:  Weaned off dopamine and now on room air. States she is feeling a little tired as it has been a long day.   Objective   Blood pressure (Abnormal) 112/53, pulse 71, temperature 98.3 F (36.8 C), temperature source Oral, resp. rate 15, height 5\' 8"  (1.727 m), weight 117.8 kg, SpO2 96 %.        Intake/Output Summary (Last 24 hours) at 09/23/2019 1230 Last data filed at 09/23/2019 1100 Gross per 24 hour  Intake 282.65 ml  Output no documentation  Net 282.65 ml   Filed Weights   09/21/19 2350 09/23/19 0430  Weight: 116.1 kg 117.8 kg    Examination: General: Obese adult F, reclined in bed NAD.  HENT: NCAT. Pink mmm. Anicteric sclera. Trachea midline  Lungs: CTA bilaterally however lung sounds are distant. Symmetrical chest expansion. No accessory muscle use on RA Cardiovascular: RRR, distant heart sounds however no rgm are audible. Cap refill < 3 sec BUE BLE Abdomen: Obese, soft, round, ndnt. Normoactive x4 Extremities: Symmetrical bulk and tone. BLE with 2+ pitting edema. SCDs in place. No obvious joint deformity. No cyanosis or clubbing  Neuro: AAOx4 following commands.  GU: Defer Skin: clean, dry, warm, without rash   Resolved Hospital Problem list     Assessment & Plan:   Acute on chronic renal failure.  Acute GN vs cardiorenal syndrome -renal US showing medical renal on right kidney; left not visible  -now w/ worsening cr and climbing K, heavy proteinuria (24 protein 5,602) -FeNa Pre-renal (0.2%) Plan Admit to ICU Place iHD cath, CXR to follow  Will need CRRT-- nephrology made aware of arrival to [facility] cone and will place orders Strict I&O Serial chemistries Once hemodynamically and metabolically stabilized need to get CT abd/pelvis to assess left kidney  F/u initiated GN workup  ARB stopped  Acute on chronic diastolic HF w/ bilateral pleural effusions and pulmonary edema Plan  Volume removal w/ CRRT Map goal as below Holding further antihypertensives Daily weights  Hypotension.  -EF and LV fxn suggesting not cardiogenic in nature. Suspect somewhat chronic in setting of progressive renal failure -serum cort borderline. 18.9 Plan Dopamine weaned off If MAPs < 60 or < 65 with AMS during CRRT, would favor initiation of NE for BP support Cosyntropin stim test in am  Add midodrine  Volume removal as able (see above) Holding atenolol, plendil, hydralazine and losartan (as above)  Acute respiratory failure 2/2 Pulmonary edema and bilateral pleural effusions  Hx OSA on home CPAP Plan Volume removal as above  O2 titration PRN for sats > 92% Nocturnal BiPAP IS and Flutter valve  Prn CXR   Hyperkalemia in setting of worsening renal failure Plan Initiate CRRT per nephrology Serial chemistries  Mild Macrocytic anemia  -no evidence of bleeding  Plan Trend cbc Transfuse for hgb < 7   Elevated LFTs. ? Passive congestion?  Plan Trend LFTs  F/u pending hepatitis panel  Hypothyroidism Plan Continue Synthroid  H/o seizure  Plan Cont home dilantin Check free dilantin  H/o GERD Plan Pepcid   Mild Transaminitis  Likely in setting of acute hypotension vs heart failure vs hepatic involvement in acute renal failure Acute hepatitis panel non-reactive Plan Trend PRN  Best practice:  Diet: NPO overnight due to qHS BiPAP. Renal diet in AM Pain/Anxiety/Delirium protocol (if indicated): NA VAP protocol (  if indicated): NA DVT prophylaxis: SCD GI prophylaxis: pepcid  Glucose control: NA Mobility: bedrest on admit  Code Status: full code  Family Communication: pending Disposition: critically ill needing ICU admission for titration of vasoactive gtts and CRRT  Labs   CBC: Recent Labs  Lab 09/22/19 0121 09/22/19 0422 09/23/19 0425  WBC 9.9 8.8 9.6  HGB 9.8* 9.7* 9.1*  HCT 29.6* 30.8* 28.6*  MCV 99.7 101.3* 102.9*  PLT 193 178 175    Basic  Metabolic Panel: Recent Labs  Lab 09/22/19 0121 09/22/19 0422 09/23/19 0425  NA 136 136 135  K 4.8 4.9 5.3*  CL 104 103 103  CO2 19* 19* 22  GLUCOSE 110* 106* 100*  BUN 74* 78* 81*  CREATININE 4.85* 4.88* 5.49*  CALCIUM 7.6* 7.6* 7.3*   GFR: Estimated Creatinine Clearance: 11.8 mL/min (A) (by C-G formula based on SCr of 5.49 mg/dL (H)). Recent Labs  Lab 09/22/19 0121 09/22/19 0422 09/23/19 0425  WBC 9.9 8.8 9.6    Liver Function Tests: Recent Labs  Lab 09/22/19 0121 09/22/19 0422 09/23/19 0425  AST 76* 73* 73*  ALT 47* 45* 45*  ALKPHOS 145* 140* 134*  BILITOT 0.4 0.3 0.3  PROT 5.3* 5.0* 4.6*  ALBUMIN 2.4* 2.2* 2.1*   No results for input(s): LIPASE, AMYLASE in the last 168 hours. No results for input(s): AMMONIA in the last 168 hours.  ABG No results found for: PHART, PCO2ART, PO2ART, HCO3, TCO2, ACIDBASEDEF, O2SAT   Coagulation Profile: No results for input(s): INR, PROTIME in the last 168 hours.  Cardiac Enzymes: No results for input(s): CKTOTAL, CKMB, CKMBINDEX, TROPONINI in the last 168 hours.  HbA1C: No results found for: HGBA1C  CBG: No results for input(s): GLUCAP in the last 168 hours.  Review of Systems:   Pertinent positives include SOB, BLE swelling, constipation, fatigue  Past Medical History  She,  has a past medical history of CHF (congestive heart failure) ([place]), Coronary artery disease, Dyspnea, High cholesterol, Hypertension, Hypothyroidism, Renal disorder, Seizures ([place]), and Sleep apnea.   Surgical History   History reviewed. No pertinent surgical history.   Social History   reports that she has never smoked. She has never used smokeless tobacco. She reports that she does not drink alcohol or use drugs.   Family History   Her family history is not on file.   Allergies Allergies  Allergen Reactions  . Sulfa Antibiotics Hives     Home Medications  Prior to Admission medications   Medication Sig Start Date End Date Taking?  Authorizing Provider  atenolol (TENORMIN) 50 MG tablet Take 50 mg by mouth daily. 07/26/19  Yes [provider]  atorvastatin (LIPITOR) 80 MG tablet Take 80 mg by mouth daily. 09/11/19  Yes [provider]  felodipine (PLENDIL) 10 MG 24 hr tablet Take 10 mg by mouth daily. 08/15/19  Yes [provider]  gemfibrozil (LOPID) 600 MG tablet Take 1 tablet by mouth 2 (two) times daily. 06/14/19  Yes [provider]  hydrALAZINE (APRESOLINE) 25 MG tablet Take 25 mg by mouth 2 (two) times daily.   Yes [provider]  mometasone (NASONEX) 50 MCG/ACT nasal spray Place 1 spray into the nose 2 (two) times daily. 1 spray into each nostril twice daily 08/11/19  Yes [provider]  omeprazole (PRILOSEC) 40 MG capsule Take 40 mg by mouth daily.   Yes [provider]  phenytoin (DILANTIN) 100 MG ER capsule Take 3 capsules by mouth at bedtime. 07/24/19  Yes [provider]  potassium chloride (KLOR-CON) 10 MEQ tablet Take 10 mEq by mouth daily. with food 08/16/19  Yes [provider]  losartan (COZAAR) 25 MG tablet Take 2 tablets by mouth daily. 09/05/19   [provider]     Critical care time: 40 minutes    CRITICAL CARE Performed by: Cristal Generous  Critical care time was exclusive of separately billable procedures and treating other patients.  Critical care was necessary to treat or prevent imminent or life-threatening deterioration.  Critical care was time spent personally by me on the following activities: development of treatment plan with patient and/or surrogate as well as nursing, discussions with consultants, evaluation of patient's response to treatment, examination of patient, obtaining history from patient or surrogate, ordering and performing treatments and interventions, ordering and review of laboratory studies, ordering and review of radiographic studies, pulse oximetry and re-evaluation of patient's condition.   Eliseo Gum MSN, AGACNP-BC [place] [phone] If no answer, [phone] 09/24/2019, 12:55 AM

## 2019-09-23 NOTE — Care Management Important Message (Signed)
Important Message  Patient Details  Name: Sophia Mcmahon MRN: [id number] Date of Birth: 1941/10/16   Medicare Important Message Given:  Yes     Tommy Medal 09/23/2019, 12:55 PM

## 2019-09-23 NOTE — Progress Notes (Signed)
Consult resceived from primary team and chart reviewed. Patient with worsening renal failure, fluid overload, and diastolic Hf. Issues with low bp's. Echo yesterday which I read but also went over again today. LV systolic function is normal and actually hyperdynamic, RV systolic function is normal. She has grade II diastolic dysfunction with elevated LA pressure and severe biatrial enlargement, no significant valvuler abnormalities. Her low bp's are not cardiac related. With normal RV and LV function there is no role for inotropic therapy. Management is really just fluid management which renal is handling, I don't see a significant role for cardiology at this time and will discuss with primary team. In the absence of a cardiac etiology of hypotension would defer any vasopressor therapy to primary team.     Carlyle Dolly MD

## 2019-09-23 NOTE — Progress Notes (Signed)
Pt off the floor transfer to [facility] via carelink. Called report to [employer]. Daughter, Danton Clap is aware of the transfer. Expresses gratitude for all the care their mother has been given.

## 2019-09-23 NOTE — Progress Notes (Signed)
eLink Physician-Brief Progress Note Patient Name: Artis Buechele DOB: [birth year] MRN: [id number]   Date of Service  09/23/2019  HPI/Events of Note  63 F morbidly obese (BMI 61), OSA, hypertension, dyslipidemia, HFpEF, CKD. Initially presented at [facility] on 9/30 with rising creatinine and 30 lb weight gain in 6 weeks. Clinical and radiographic evidence of volume overload. Transferred to [facility] today for need for dialysis.  eICU Interventions  Dialysis catheter to be placed with plans of doing CRRT     Intervention Category Major Interventions: Acute renal failure - evaluation and management Intermediate Interventions: Hypervolemia - evaluation and management Evaluation Type: New Patient Evaluation  Judd Lien 09/23/2019, 10:50 PM

## 2019-09-23 NOTE — Progress Notes (Signed)
PROGRESS NOTE    Sophia Mcmahon  KHT:[id number] DOB: Nov 09, 1941 DOA: 09/21/2019 PCP: Frances Maywood, FNP     Brief Narrative:  78 y.o. female, w hypertension, hyperlipidemia, CKD stage4, Hypothyroidism, Seizure do, presents with c/o weight gain and swelling for the past 4 weeks. Pt notes dietary indiscretion. Pt has been exercising less.  Pt was told to STOP Torsemide and Losartan by PCP and to go to ER due to acute renal failure.  In ED T 97.5, P 59 R 22, Bp 111/47  Pox 94% on RA Wt 116.1kg  CXR IMPRESSION: CHF. Cardiomegaly with moderate pleural effusions and pulmonary edema.  Wbc 9.9, Hgb 9.8, Plt 193 Na 136, K 4.8, Bun 74, Creatinie 4.85 Alb 2.4 Ast 76, Alt 47, Alk phos 145, T. Bili 0.4 BNP 1,148  Pt will be admitted for ARF, Anasarca, and severe protein calorie malnutrition and ? CHF   Assessment & Plan: 1-anasarca: Patient with acute renal failure superimposed on chronic kidney disease ([place]): Stage III-IV (unknown baseline creatinine). -Patient will be transferred to [facility] for initiation of CVVH -Continue dopamine drip -Foley catheter in place demonstrating oliguria -Creatinine up to 5 and mild hypokalemia. -Nephrology service on board and will follow further recommendations. -Currently with concern for cardiorenal syndrome.  2-acute on chronic diastolic heart failure -Continue daily weights, low-sodium diet and strict I's and O's -Ejection fraction is preserved -Blood pressure too low for any medications currently -Follow volume management by nephrology service.  3-morbid obesity -Body mass index is 39.49 kg/m. -Low calorie diet, portion control and increase physical activity discussed with patient.  4-GERD -Continue PPI.  5-abnormal liver function -Most likely secondary to hepatic congestion -Will follow LFTs trend -Volume management with initiation of CVVH -Follow final results for hepatitis panel  6-anemia of chronic kidney disease  -Follow nephrology recommendations for IV iron and ESA.  7-obstructive sleep apnea -Continue CPAP nightly  8-seizure disorder -Status post brain surgery and requiring Dilantin -No seizure activity appreciated.   DVT prophylaxis: Heparin Code Status: Full code Family Communication: No family at bedside currently; daughter updated on plan on 09/22/2019 Disposition Plan: Patient will be transferred to ICU at [facility] for initiation of CVVH.  Patient renal function has worsened and she is having mild hyperkalemia.  Nephrology service will continue actively following patient after transfer is completed.  Consultants:   Nephrology service  Cardiology service  Critical care  Procedures:   2D echo: Demonstrating preserved ejection fraction, grade 2 diastolic dysfunction, hyperdynamic left ventricular systolic function with normal right ventricle systolic function; no significant valvular disorders.  Antimicrobials:  Anti-infectives (From admission, onward)   None       Subjective: Afebrile, denies chest pain, reports feeling nauseated and sick to her stomach; no headaches, no focal weakness, no dysuria.  Patient reports no much of urine output and is demonstrating significant swelling on physical examination.  Objective: Vitals:   09/23/19 1015 09/23/19 1030 09/23/19 1045 09/23/19 1100  BP: (!) 105/56 (!) 118/55 (!) 116/55 (!) 112/53  Pulse: 70 66 74 71  Resp: 11 18 19 15   Temp:      TempSrc:      SpO2: 97% 97% 96% 96%  Weight:      Height:        Intake/Output Summary (Last 24 hours) at 09/23/2019 1127 Last data filed at 09/23/2019 1100 Gross per 24 hour  Intake 282.65 ml  Output -  Net 282.65 ml   Filed Weights   09/21/19 2350  09/23/19 0430  Weight: 116.1 kg 117.8 kg    Examination: General exam: Alert, awake, oriented x 3; reports abdominal discomfort and feeling nauseated.  No chest pain and a stable breathing at this time.  Patient requiring 2 L  nasal cannula supplementation. Respiratory system: Decreased breath sounds at the bases, no using accessory muscles, no wheezing. Cardiovascular system:RRR. Positive systolic murmur, no rubs or gallops. Gastrointestinal system: Abdomen is nondistended, soft and nontender. No organomegaly or masses felt. Normal bowel sounds heard. Central nervous system: Alert and oriented. No focal neurological deficits. Extremities: No cyanosis; 2-3+ lower extremity edema bilaterally 1+ edema on her upper limbs.  No open wounds. Skin: No rashes, no petechiae Psychiatry: Judgement and insight appear normal. Mood & affect appropriate.    Data Reviewed: I have personally reviewed following labs and imaging studies  CBC: Recent Labs  Lab 09/22/19 0121 09/22/19 0422 09/23/19 0425  WBC 9.9 8.8 9.6  HGB 9.8* 9.7* 9.1*  HCT 29.6* 30.8* 28.6*  MCV 99.7 101.3* 102.9*  PLT 193 178 778   Basic Metabolic Panel: Recent Labs  Lab 09/22/19 0121 09/22/19 0422 09/23/19 0425  NA 136 136 135  K 4.8 4.9 5.3*  CL 104 103 103  CO2 19* 19* 22  GLUCOSE 110* 106* 100*  BUN 74* 78* 81*  CREATININE 4.85* 4.88* 5.49*  CALCIUM 7.6* 7.6* 7.3*   GFR: Estimated Creatinine Clearance: 11.8 mL/min (A) (by C-G formula based on SCr of 5.49 mg/dL (H)).   Liver Function Tests: Recent Labs  Lab 09/22/19 0121 09/22/19 0422 09/23/19 0425  AST 76* 73* 73*  ALT 47* 45* 45*  ALKPHOS 145* 140* 134*  BILITOT 0.4 0.3 0.3  PROT 5.3* 5.0* 4.6*  ALBUMIN 2.4* 2.2* 2.1*   Thyroid Function Tests: Recent Labs    09/22/19 1121  TSH 6.835*   Anemia Panel: Recent Labs    09/22/19 0121  VITAMINB12 696  FERRITIN 262  TIBC 208*  IRON 57   Urine analysis:    Component Value Date/Time   COLORURINE YELLOW 09/22/2019 1029   APPEARANCEUR CLOUDY (A) 09/22/2019 1029   LABSPEC 1.023 09/22/2019 1029   PHURINE 5.0 09/22/2019 1029   GLUCOSEU NEGATIVE 09/22/2019 1029   HGBUR SMALL (A) 09/22/2019 1029   BILIRUBINUR NEGATIVE  09/22/2019 1029   KETONESUR NEGATIVE 09/22/2019 1029   PROTEINUR >=300 (A) 09/22/2019 1029   NITRITE NEGATIVE 09/22/2019 1029   LEUKOCYTESUR NEGATIVE 09/22/2019 1029    Recent Results (from the past 240 hour(s))  SARS CORONAVIRUS 2 (TAT 6-24 HRS) Nasopharyngeal Nasopharyngeal Swab     Status: None   Collection Time: 09/22/19  3:31 AM   Specimen: Nasopharyngeal Swab  Result Value Ref Range Status   SARS Coronavirus 2 NEGATIVE NEGATIVE Final    Comment: (NOTE) SARS-CoV-2 target nucleic acids are NOT DETECTED. The SARS-CoV-2 RNA is generally detectable in upper and lower respiratory specimens during the acute phase of infection. Negative results do not preclude SARS-CoV-2 infection, do not rule out co-infections with other pathogens, and should not be used as the sole basis for treatment or other patient management decisions. Negative results must be combined with clinical observations, patient history, and epidemiological information. The expected result is Negative. Fact Sheet for Patients: SugarRoll.be Fact Sheet for Healthcare Providers: https://www.woods-mathews.com/ This test is not yet approved or cleared by the [place] FDA and  has been authorized for detection and/or diagnosis of SARS-CoV-2 by FDA under an Emergency Use Authorization (EUA). This EUA will remain  in effect (meaning this  test can be used) for the duration of the COVID-19 declaration under Section 56 4(b)(1) of the Act, 21 U.S.C. section 360bbb-3(b)(1), unless the authorization is terminated or revoked sooner. Performed at [facility], [address], [place], [place] [address]     Radiology Studies: US Abdomen Complete  Result Date: 09/22/2019 CLINICAL DATA:  Acute renal failure EXAM: ABDOMEN ULTRASOUND COMPLETE COMPARISON:  None. FINDINGS: Gallbladder: No gallstones or wall thickening visualized. No sonographic Murphy sign noted by sonographer. Common  bile duct: Diameter: Not visualized Liver: No focal lesion identified. Within normal limits in parenchymal echogenicity. Portal vein is patent on color Doppler imaging with normal direction of blood flow towards the liver. IVC: No abnormality visualized. Pancreas: Visualized portion unremarkable. Spleen: Not visualized Right Kidney: Length: 10.5 cm, difficult to visualize. Increased echotexture diffusely. No hydronephrosis seen. Left Kidney: Length: Not visualized. Abdominal aorta: No aneurysm visualized. Other findings: Moderate right pleural effusion noted. Small amount of ascites seen. IMPRESSION: Limited study due to bowel gas and body habitus. Right kidney visualized and appears echogenic suggesting chronic medical renal disease. Left kidney not visualized. Small amount of ascites.  Right pleural effusion. Electronically Signed   By: Rolm Baptise M.D.   On: 09/22/2019 11:17   Dg Chest Port 1 View  Result Date: 09/22/2019 CLINICAL DATA:  Shortness of breath. History of CHF, stopped diuretics. 30 pound weight gain. EXAM: PORTABLE CHEST 1 VIEW COMPARISON:  None. FINDINGS: Cardiomegaly. There is moderate pulmonary edema. Hazy lung base opacities likely combination of pleural effusions and compressive atelectasis. No pneumothorax or confluent airspace disease in the aerated lungs. Advanced degenerative change of both shoulders. IMPRESSION: CHF. Cardiomegaly with moderate pleural effusions and pulmonary edema. Electronically Signed   By: Keith Rake M.D.   On: 09/22/2019 00:40    Scheduled Meds: . Chlorhexidine Gluconate Cloth  6 each Topical Daily  . feeding supplement (PRO-STAT SUGAR FREE 64)  30 mL Oral BID  . heparin  5,000 Units Subcutaneous Q8H  . levothyroxine  25 mcg Oral Q0600  . phenytoin  300 mg Oral QHS  . sodium chloride flush  3 mL Intravenous Q12H   Continuous Infusions: . sodium chloride    . DOPamine 5 mcg/kg/min (09/23/19 1100)     LOS: 1 day    Time spent: 35 minutes.  Greater than 50% of this time was spent in direct contact with the patient, coordinating care and discussing relevant ongoing clinical issues, including worsening renal function and decreased urine output; we have also discussed findings on 2D echo demonstrating diastolic heart failure with preserved ejection fraction and the results of her renal ultrasound demonstrating no obstructive uropathy.  Patient has been informed of the need for CVVH at [facility] following recommendations by nephrology service to further assist in the treatment of her acute on chronic renal disease with a presentation of cardiorenal syndrome and fluid overload.  Patient currently on dopamine drip.     Barton Dubois, MD [facility] Pager [phone]  If 7PM-7AM, please contact night-coverage [email or web address] Password [email or web address] 09/23/2019, 11:27 AM

## 2019-09-24 ENCOUNTER — Inpatient Hospital Stay (HOSPITAL_COMMUNITY): Payer: Medicare Other

## 2019-09-24 DIAGNOSIS — I952 Hypotension due to drugs: Secondary | ICD-10-CM

## 2019-09-24 DIAGNOSIS — N179 Acute kidney failure, unspecified: Principal | ICD-10-CM

## 2019-09-24 DIAGNOSIS — R945 Abnormal results of liver function studies: Secondary | ICD-10-CM

## 2019-09-24 DIAGNOSIS — R601 Generalized edema: Secondary | ICD-10-CM

## 2019-09-24 DIAGNOSIS — I5033 Acute on chronic diastolic (congestive) heart failure: Secondary | ICD-10-CM

## 2019-09-24 DIAGNOSIS — N189 Chronic kidney disease, unspecified: Secondary | ICD-10-CM

## 2019-09-24 LAB — CBC
HCT: 27.4 % — ABNORMAL LOW (ref 36.0–46.0)
Hemoglobin: 9.2 g/dL — ABNORMAL LOW (ref 12.0–15.0)
MCH: 33.7 pg (ref 26.0–34.0)
MCHC: 33.6 g/dL (ref 30.0–36.0)
MCV: 100.4 fL — ABNORMAL HIGH (ref 80.0–100.0)
Platelets: 155 10*3/uL (ref 150–400)
RBC: 2.73 MIL/uL — ABNORMAL LOW (ref 3.87–5.11)
RDW: 15.4 % (ref 11.5–15.5)
WBC: 9.6 10*3/uL (ref 4.0–10.5)
nRBC: 0 % (ref 0.0–0.2)

## 2019-09-24 LAB — POCT ACTIVATED CLOTTING TIME
Activated Clotting Time: 98 seconds
Activated Clotting Time: 109 seconds
Activated Clotting Time: 114 seconds
Activated Clotting Time: 125 seconds
Activated Clotting Time: 136 seconds
Activated Clotting Time: 142 seconds
Activated Clotting Time: 153 seconds

## 2019-09-24 LAB — RENAL FUNCTION PANEL
Albumin: 1.8 g/dL — ABNORMAL LOW (ref 3.5–5.0)
Albumin: 1.8 g/dL — ABNORMAL LOW (ref 3.5–5.0)
Anion gap: 10 (ref 5–15)
Anion gap: 11 (ref 5–15)
BUN: 74 mg/dL — ABNORMAL HIGH (ref 8–23)
BUN: 60 mg/dL — ABNORMAL HIGH (ref 8–23)
CO2: 22 mmol/L (ref 22–32)
CO2: 21 mmol/L — ABNORMAL LOW (ref 22–32)
Calcium: 7.1 mg/dL — ABNORMAL LOW (ref 8.9–10.3)
Calcium: 7 mg/dL — ABNORMAL LOW (ref 8.9–10.3)
Chloride: 103 mmol/L (ref 98–111)
Chloride: 102 mmol/L (ref 98–111)
Creatinine, Ser: 5.1 mg/dL — ABNORMAL HIGH (ref 0.44–1.00)
Creatinine, Ser: 4.19 mg/dL — ABNORMAL HIGH (ref 0.44–1.00)
GFR calc Af Amer: 9 mL/min — ABNORMAL LOW (ref >60)
GFR calc Af Amer: 11 mL/min — ABNORMAL LOW (ref >60)
GFR calc non Af Amer: 8 mL/min — ABNORMAL LOW (ref >60)
GFR calc non Af Amer: 10 mL/min — ABNORMAL LOW (ref >60)
Glucose, Bld: 112 mg/dL — ABNORMAL HIGH (ref 70–99)
Glucose, Bld: 127 mg/dL — ABNORMAL HIGH (ref 70–99)
Phosphorus: 6.2 mg/dL — ABNORMAL HIGH (ref 2.5–4.6)
Phosphorus: 5.2 mg/dL — ABNORMAL HIGH (ref 2.5–4.6)
Potassium: 4.8 mmol/L (ref 3.5–5.1)
Potassium: 4.6 mmol/L (ref 3.5–5.1)
Sodium: 135 mmol/L (ref 135–145)
Sodium: 134 mmol/L — ABNORMAL LOW (ref 135–145)

## 2019-09-24 LAB — COMPLEMENT, TOTAL: Compl, Total (CH50): 45 U/mL (ref >41)

## 2019-09-24 LAB — PHENYTOIN LEVEL, TOTAL: Phenytoin Lvl: 6.8 ug/mL — ABNORMAL LOW (ref 10.0–20.0)

## 2019-09-24 LAB — MAGNESIUM: Magnesium: 2.1 mg/dL (ref 1.7–2.4)

## 2019-09-24 LAB — APTT: aPTT: 88 seconds — ABNORMAL HIGH (ref 24–36)

## 2019-09-24 LAB — T4, FREE: Free T4: 0.65 ng/dL (ref 0.61–1.12)

## 2019-09-24 LAB — GLUCOSE, CAPILLARY: Glucose-Capillary: 107 mg/dL — ABNORMAL HIGH (ref 70–99)

## 2019-09-24 MED ORDER — HEPARIN SODIUM (PORCINE) 1000 UNIT/ML DIALYSIS
1000.0000 [IU] | INTRAMUSCULAR | Status: DC | PRN
  Administered 2019-09-24: 2400 [IU] via INTRAVENOUS_CENTRAL
  Filled 2019-09-24 (×3): qty 6

## 2019-09-24 MED ORDER — PRISMASOL BGK 4/2.5 32-4-2.5 MEQ/L REPLACEMENT SOLN
Status: DC
  Administered 2019-09-24 – 2019-09-26 (×5): via INTRAVENOUS_CENTRAL
  Filled 2019-09-24 (×8): qty 5000

## 2019-09-24 MED ORDER — PRISMASOL BGK 4/2.5 32-4-2.5 MEQ/L IV SOLN
INTRAVENOUS | Status: DC
  Administered 2019-09-24 – 2019-09-27 (×22): via INTRAVENOUS_CENTRAL
  Filled 2019-09-24 (×30): qty 5000

## 2019-09-24 MED ORDER — MIDODRINE HCL 5 MG PO TABS
5.0000 mg | ORAL_TABLET | Freq: Three times a day (TID) | ORAL | Status: DC
  Administered 2019-09-24 – 2019-09-27 (×12): 5 mg via ORAL
  Filled 2019-09-24 (×12): qty 1

## 2019-09-24 MED ORDER — HEPARIN (PORCINE) 2000 UNITS/L FOR CRRT
INTRAVENOUS_CENTRAL | Status: DC | PRN
  Administered 2019-09-24: 02:00:00 via INTRAVENOUS_CENTRAL
  Filled 2019-09-24 (×2): qty 1000

## 2019-09-24 MED ORDER — ONDANSETRON HCL 4 MG/2ML IJ SOLN
4.0000 mg | Freq: Four times a day (QID) | INTRAMUSCULAR | Status: DC | PRN
  Administered 2019-10-05 – 2019-10-06 (×2): 4 mg via INTRAVENOUS
  Filled 2019-09-24 (×2): qty 2

## 2019-09-24 MED ORDER — ACETAMINOPHEN 325 MG PO TABS
650.0000 mg | ORAL_TABLET | ORAL | Status: DC | PRN
  Administered 2019-09-24 – 2019-09-27 (×3): 650 mg via ORAL
  Filled 2019-09-24 (×3): qty 2

## 2019-09-24 MED ORDER — SODIUM CHLORIDE 0.9 % IV SOLN
250.0000 [IU]/h | INTRAVENOUS | Status: DC
  Administered 2019-09-24 (×2): 1350 [IU]/h via INTRAVENOUS_CENTRAL
  Administered 2019-09-24: 250 [IU]/h via INTRAVENOUS_CENTRAL
  Administered 2019-09-24: 1350 [IU]/h via INTRAVENOUS_CENTRAL
  Administered 2019-09-25: 1500 [IU]/h via INTRAVENOUS_CENTRAL
  Administered 2019-09-25 (×2): 1450 [IU]/h via INTRAVENOUS_CENTRAL
  Administered 2019-09-26: 01:00:00 1500 [IU]/h via INTRAVENOUS_CENTRAL
  Filled 2019-09-24 (×9): qty 2

## 2019-09-24 MED ORDER — FAMOTIDINE IN NACL 20-0.9 MG/50ML-% IV SOLN: 20.0000 mg | INTRAVENOUS | Status: DC

## 2019-09-24 MED ORDER — HEPARIN BOLUS VIA INFUSION (CRRT)
1000.0000 [IU] | INTRAVENOUS | Status: DC | PRN
  Administered 2019-09-24 (×5): 1000 [IU] via INTRAVENOUS_CENTRAL
  Filled 2019-09-24: qty 1000

## 2019-09-24 MED ORDER — PRISMASOL BGK 4/2.5 32-4-2.5 MEQ/L REPLACEMENT SOLN
Status: DC
  Administered 2019-09-24 – 2019-09-26 (×6): via INTRAVENOUS_CENTRAL
  Filled 2019-09-24 (×8): qty 5000

## 2019-09-24 NOTE — Progress Notes (Signed)
Patient ID: Sophia Mcmahon, female   DOB: 11-15-1941, 78 y.o.   MRN: [id number]  [facility] Progress Note   Assessment/ Plan:   1. Acute kidney Injury on chronic kidney disease (baseline creatinine/renal function unclear): Acute kidney injury likely from acute cardiorenal syndrome versus ischemic ATN.  Transferred to [facility] after developing anuria/oliguria with worsening azotemia and hyperkalemia with low-dose pressor requirement.  She has been weaned off of pressors and is now on CRRT for volume unloading and is tolerating UF of 100-150 cc/h (net -0.5 L overnight).  She was oliguric yesterday with 350 cc urine output.  It is anticipated that this will be short-term dialysis requirement for volume unloading and I will continue to monitor closely for potential renal recovery.  I appreciate the CCM services.  Will need evaluation for nephrotic syndrome when renal function recovers. 2.  Acute exacerbation of congestive heart failure: Started on CRRT yesterday for volume unloading after admitted with volume overload/refractoriness to outpatient diuretic therapy.  Not on pressors at this time. 3.  Hyperkalemia: Mild and corrected with initiation of CRRT. 4.  Anemia: Likely from chronic illness including chronic kidney disease and dilution and volume excess.  Iron saturation 27%-we will give intravenous iron and start ESA.  Subjective:   Transferred to [facility] yesterday and started on CRRT after placement of dialysis catheter by Dr. Randell Patient of critical care.  She reports to be feeling a little better this morning.   Objective:   BP (!) 106/57   Pulse (!) 53   Temp (!) 96.3 F (35.7 C) (Axillary)   Resp 17   Ht 5\' 8"  (1.727 m)   Wt 117.2 kg   SpO2 95%   BMI 39.29 kg/m   Intake/Output Summary (Last 24 hours) at 09/24/2019 6295 Last data filed at 09/24/2019 2841 Gross per 24 hour  Intake 611.63 ml  Output 1164 ml  Net -552.37 ml   Weight change: -1.6  kg  Physical Exam: Gen: Resting comfortably in bed, on CRRT via right IJ dialysis catheter CVS: Pulse regular bradycardia, S1 and S2 normal Resp: Anteriorly clear to auscultation, no distinct rales or rhonchi Abd: Soft, obese, nontender Ext: 3+ anasarca  Imaging: US Abdomen Complete  Result Date: 09/22/2019 CLINICAL DATA:  Acute renal failure EXAM: ABDOMEN ULTRASOUND COMPLETE COMPARISON:  None. FINDINGS: Gallbladder: No gallstones or wall thickening visualized. No sonographic Murphy sign noted by sonographer. Common bile duct: Diameter: Not visualized Liver: No focal lesion identified. Within normal limits in parenchymal echogenicity. Portal vein is patent on color Doppler imaging with normal direction of blood flow towards the liver. IVC: No abnormality visualized. Pancreas: Visualized portion unremarkable. Spleen: Not visualized Right Kidney: Length: 10.5 cm, difficult to visualize. Increased echotexture diffusely. No hydronephrosis seen. Left Kidney: Length: Not visualized. Abdominal aorta: No aneurysm visualized. Other findings: Moderate right pleural effusion noted. Small amount of ascites seen. IMPRESSION: Limited study due to bowel gas and body habitus. Right kidney visualized and appears echogenic suggesting chronic medical renal disease. Left kidney not visualized. Small amount of ascites.  Right pleural effusion. Electronically Signed   By: Rolm Baptise M.D.   On: 09/22/2019 11:17   Dg Chest Port 1 View  Result Date: 09/24/2019 CLINICAL DATA:  Central line placement EXAM: PORTABLE CHEST 1 VIEW COMPARISON:  09/22/2019 FINDINGS: Interval placement of right-sided central venous catheter, with tip overlying the SVC. No right pneumothorax. Enlarged cardiomediastinal silhouette with vascular congestion. Asymmetric opacity in the right thorax is likely layering effusion.  Left pleural effusion remains. Continued dense consolidation at both bases. Possible calcified loose body at the right shoulder.  IMPRESSION: 1. Right-sided central venous catheter tip over the SVC. No pneumothorax 2. Cardiomegaly with vascular congestion 3. Bilateral pleural effusions, likely layering on the right side. Persistent dense airspace disease at both lung bases. Electronically Signed   By: Donavan Foil M.D.   On: 09/24/2019 02:00    Labs: BMET Recent Labs  Lab 09/22/19 0121 09/22/19 0422 09/23/19 0425 09/24/19 0619  NA 136 136 135 135  K 4.8 4.9 5.3* 4.8  CL 104 103 103 103  CO2 19* 19* 22 22  GLUCOSE 110* 106* 100* 112*  BUN 74* 78* 81* 74*  CREATININE 4.85* 4.88* 5.49* 5.10*  CALCIUM 7.6* 7.6* 7.3* 7.1*  PHOS  --   --   --  6.2*   CBC Recent Labs  Lab 09/22/19 0121 09/22/19 0422 09/23/19 0425 09/24/19 0619  WBC 9.9 8.8 9.6 9.6  HGB 9.8* 9.7* 9.1* 9.2*  HCT 29.6* 30.8*  29.7* 28.6* 27.4*  MCV 99.7 101.3* 102.9* 100.4*  PLT 193 178 175 155    Medications:    . Chlorhexidine Gluconate Cloth  6 each Topical Daily  . feeding supplement (PRO-STAT SUGAR FREE 64)  30 mL Oral BID  . heparin  5,000 Units Subcutaneous Q8H  . levothyroxine  25 mcg Oral Q0600  . midodrine  5 mg Oral TID WC  . pantoprazole  40 mg Oral Daily  . phenytoin  300 mg Oral QHS  . sodium chloride flush  3 mL Intravenous Q12H   Elmarie Shiley, MD 09/24/2019, 8:39 AM

## 2019-09-24 NOTE — Progress Notes (Signed)
Pt wore CPAP <78min and states she can not tolerate mask. Pt has Nasal pillows at home. I explained we do not have those here but she is free to have someone bring home mask if that is possible. RT will continue to monitor

## 2019-09-24 NOTE — Procedures (Signed)
Hemodialysis Catheter Insertion Procedure Note Sophia Mcmahon [id number] April 13, 1941  Procedure: Insertion of Hemodialysis Catheter Indications: Dialysis Access   Procedure Details Consent: Risks of procedure as well as the alternatives and risks of each were explained to the (patient/caregiver).  Consent for procedure obtained. Time Out: Verified patient identification, verified procedure, site/side was marked, verified correct patient position, special equipment/implants available, medications/allergies/relevent history reviewed, required imaging and test results available.  Performed  Maximum sterile technique was used including antiseptics, cap, gloves, gown, hand hygiene, mask and sheet. Skin prep: Chlorhexidine; local anesthetic administered Triple lumen hemodialysis catheter was inserted into right internal jugular vein using the Seldinger technique under ultrasound guidance  Evaluation Blood flow good Complications: No apparent complications Patient did tolerate procedure well. Chest X-ray ordered to verify placement.  CXR: normal.   Jacques Earthly, MD 09/24/2019

## 2019-09-24 NOTE — Progress Notes (Signed)
Asked patient what time she wanted to go on Bipap tonight; dreamstation was at bedside.  She stated that she does not wish to use our machine, her daughter is bringing home machine tomorrow.  No distress noted, patient sats at 96% at this time, will continue to monitor.

## 2019-09-24 NOTE — Progress Notes (Signed)
Pt linens changed and pt turned around 0430 this morning. ST elevation noticed in V lead on EKG monitor after position change. Pt c/o no chest pain/SOB/dizziness, HR-56(SB with 1 degrees HB), BP-111/51, skin warm and dry. EKG done-SB with 1 degree HB with premature supraventricular complexes, L axis deviation, nonspecific intraventricular block, cannot rule out septal infarct, age undetermined, cannot rulE out inferior infarct, age undetermined. Dr. Genevive Bi notified. Will monitor pt.

## 2019-09-24 NOTE — Progress Notes (Signed)
NAME:  Sophia Mcmahon, MRN:  [id number], DOB:  [birth year], LOS: 2 ADMISSION DATE:  09/21/2019, CONSULTATION DATE:  NA REFERRING MD:  Dyann Kief, CHIEF COMPLAINT:   Acute diastolic heart failure , And acute on chronic renal failure  Brief History   78 year old female admitted multiple medical comorbidities as listed below.  Being transferred from [facility] where she was admitted on 9/30 to [facility] on 10/2 in the setting of progressive cardiorenal syndrome, hypotension, and worsening acute on chronic renal failure.  History of present illness   78 year old female who was admitted initially to [facility] on 9/30 after being sent To the emergency room by her primary care provider for rising creatinine and 30 pound weight gain over a 6-week period.  She had been apparently followed in the outside setting initially seen by primary care provider on 9/26 with a serum creatinine of 3.8 at which time her torsemide was increased a recheck serum creatinine on 9/30 demonstrated a creatinine of 4.63.  She was admitted to the hospital, initial therapeutic interventions included: Gentle hydration, holding losartan and torsemide, and obtaining nephrology consultation.  She is seen by nephrology on 10/1 who felt her acute on chronic renal failure was the result of decompensated congestive heart failure, escalated diuresis and ARB therapy.  Renal ultrasound was ordered, laboratory work-up for glomerular nephrosis and nephrotic syndrome initiated and IV therapy continued.  The following day on 10/2 weight was up 1.67 kg serum creatinine had risen from 4.88-5.49 with a potassium now registered at 5.3.  Renal ultrasound suggested chronic medical renal disease, however they were unable to evaluate the left kidney, urine studies to date with low FENa consistent with cardiorenal syndrome or acute GN.  She was placed on dopamine infusion, antihypertensives held, transferring to [facility] for CRRT.  Upon arrival  to [facility], patient hemodynamics have improved. She is now off dopamine and off of supplemental O2.   Past Medical History  Coronary artery disease, obesity, obstructive sleep apnea, CKD (stage unknown), hypertension, hyperlipidemia.  Significant Hospital Events    9/30 demonstrated a creatinine of 4.63.  Admitted to the hospital, initial therapeutic interventions included: Gentle hydration, holding losartan and torsemide, and obtaining nephrology consultation.  10/1 who felt her acute on chronic renal failure was the result of decompensated congestive heart failure, escalated diuresis and ARB therapy.  IV therapy continued.   10/2 weight was up 1.67 kg serum creatinine had risen from 4.88-5.49; K 5. 3,  low FENa consistent with cardiorenal syndrome or acute GN.  She was placed on dopamine infusion, antihypertensives held, transfer process to [facility] initiated for CRRT need 10/3 arrives to [facility]. Weaned off dopamine, supplemental O2. iHd cath to be placed   Consults:  Renal 10/1  Cardiology 10/2  Procedures:   10/3> Trialysis catheter  Significant Diagnostic Tests:   Renal ultrasound 10/1: suggested chronic medical renal disease, however they were unable to evaluate the left kidney,  Echo 10/2:  LV systolic function is normal and actually hyperdynamic, RV systolic function is normal. She has grade II diastolic dysfunction with elevated LA pressure and severe biatrial enlargement, no significant valvuler abnormalities. Her low bp's are not cardiac related. With normal RV and LV function   GN eval 10/1:  Urine immunofixation>>> Anti-DNA antibody>>>  antistreptolysin O titer>>> Glomerular basement membrane antibodies>>> Total complement>>> C4> 19 C3>104 MPO/PR-3>> ANA,IFA (w/ reflex) UPEP/UIFE/Light chains/TP 24hr urine >> 24 hr protein>>> Protein electrophoresis >>> Kappa/lambda light chain ratio> 0.07 Kappa free  light chain> 74.6 (H) Lambda free light chain > 1062 (H)     Micro Data:  COVID 10/1: neg   Antimicrobials:  N/a   Interim history/subjective:  Weaned off dopamine overnight, doing well on room air.  Did not like using hospital BiPAP due to mask.  Objective   Blood pressure (!) 103/49, pulse (!) 54, temperature (!) 96.3 F (35.7 C), temperature source Axillary, resp. rate (!) 21, height 5\' 8"  (1.727 m), weight 117.2 kg, SpO2 94 %.        Intake/Output Summary (Last 24 hours) at 09/24/2019 1031 Last data filed at 09/24/2019 1000 Gross per 24 hour  Intake 641.63 ml  Output 1734 ml  Net -1092.37 ml   Filed Weights   09/23/19 0430 09/23/19 2230 09/24/19 0430  Weight: 117.8 kg 116.2 kg 117.2 kg    Examination: General: Lying in bed comfortably in no acute distress  HENT: [place]/AT, anicteric sclera Lungs: Breathing comfortably on room air, no tachypnea or conversational dyspnea, clear to auscultation bilaterally  cardiovascular: Regular rate and rhythm, no murmurs Abdomen: BS, soft, nontender Extremities: Pitting edema bilaterally, no deformities, no cyanosis or clubbing Neuro: Awake and alert, answering questions appropriately, moving all extremity spontaneously, normal speech Skin: Dry, no rashes  Resolved Hospital Problem list     Assessment & Plan:   Acute on chronic renal failure, rapidly progressive and of unknown etiology Acute GN vs cardiorenal syndrome -renal US showing medical renal on right kidney; left not visible  -now w/ worsening cr and climbing K, heavy proteinuria (24 protein 5,602) -FeNa Pre-renal (0.2%) Plan -Continue ICU admission for CRRT - His renal CT ordered to better evaluate her left kidney -Appreciate nephrology's assistance -Renal diet -Continue to hold PTA ARB  Acute on chronic diastolic HF w/ bilateral pleural effusions and pulmonary edema Plan Volume removal w/ CRRT Map goal as below Holding further antihypertensives Daily weights  Hypotension, but history of hypertension on multiple  antihypertensives at home.  Suspect this is due to toxicity from accumulation due to renal failure. -EF and LV fxn suggesting not cardiogenic in nature Plan -Continue to monitor, no longer requiring inotrope support -Holding PTA antihypertensives -Cosyntropin stim test ordered, but feel this can likely be held unless she has refractory hypotension. -If requiring inotrope or vasopressor support, would prefer to use norepinephrine. -Volume management per nephrology  Acute respiratory failure 2/2 Pulmonary edema and bilateral pleural effusions  Hx OSA on home CPAP Plan - Volume removal dialysis -Supplemental oxygen as required to maintain SPO2 greater than 90%, wean down as tolerated -Nocturnal BiPAP, and the patient is planning to have hers brought from home  Hyperkalemia in setting of worsening renal failure Plan -CRRT -continue to monitor  Mild Macrocytic anemia, likely chronic due to progressive CKD -no evidence of bleeding  Plan -Serial CBCs -No plans for transfusion unless hemoglobin less than 7 or signs of active bleeding with hemodynamic instability  Elderly elevated LFTs. Passive congestion is the most likely cause Plan -Trend liver function studies  Hypothyroidism, TSH minimally elevated at 6.8 Plan -Continue PTA Synthroid -Can consider increasing to 37.5 mcg daily if she remains hypotensive. H/o seizure  Plan -Continue PTA phenytoin  H/o GERD Plan -Continue PPI, which is a home medication    Best practice:  Diet: Renal diet  Pain/Anxiety/Delirium protocol (if indicated): NA VAP protocol (if indicated): NA DVT prophylaxis: SCD GI prophylaxis: pepcid  Glucose control: NA Mobility: bedrest on admit  Code Status: full code  Family Communication: pending Disposition: critically  ill needing ICU admission for titration of vasoactive gtts and CRRT  Labs   CBC: Recent Labs  Lab 09/22/19 0121 09/22/19 0422 09/23/19 0425 09/24/19 0619  WBC 9.9 8.8 9.6 9.6   HGB 9.8* 9.7* 9.1* 9.2*  HCT 29.6* 30.8*  29.7* 28.6* 27.4*  MCV 99.7 101.3* 102.9* 100.4*  PLT 193 178 175 979    Basic Metabolic Panel: Recent Labs  Lab 09/22/19 0121 09/22/19 0422 09/23/19 0425 09/24/19 0619  NA 136 136 135 135  K 4.8 4.9 5.3* 4.8  CL 104 103 103 103  CO2 19* 19* 22 22  GLUCOSE 110* 106* 100* 112*  BUN 74* 78* 81* 74*  CREATININE 4.85* 4.88* 5.49* 5.10*  CALCIUM 7.6* 7.6* 7.3* 7.1*  MG  --   --   --  2.1  PHOS  --   --   --  6.2*   GFR: Estimated Creatinine Clearance: 12.6 mL/min (A) (by C-G formula based on SCr of 5.1 mg/dL (H)). Recent Labs  Lab 09/22/19 0121 09/22/19 0422 09/23/19 0425 09/24/19 0619  WBC 9.9 8.8 9.6 9.6    Liver Function Tests: Recent Labs  Lab 09/22/19 0121 09/22/19 0422 09/23/19 0425 09/24/19 0619  AST 76* 73* 73*  --   ALT 47* 45* 45*  --   ALKPHOS 145* 140* 134*  --   BILITOT 0.4 0.3 0.3  --   PROT 5.3* 5.0* 4.6*  --   ALBUMIN 2.4* 2.2* 2.1* 1.8*   No results for input(s): LIPASE, AMYLASE in the last 168 hours. No results for input(s): AMMONIA in the last 168 hours.  ABG No results found for: PHART, PCO2ART, PO2ART, HCO3, TCO2, ACIDBASEDEF, O2SAT   Coagulation Profile: No results for input(s): INR, PROTIME in the last 168 hours.  Cardiac Enzymes: No results for input(s): CKTOTAL, CKMB, CKMBINDEX, TROPONINI in the last 168 hours.  HbA1C: No results found for: HGBA1C  CBG: Recent Labs  Lab 09/23/19 2230  GLUCAP 109*   This patient is critically ill with multiple organ system failure which requires frequent high complexity decision making, assessment, support, evaluation, and titration of therapies. This was completed through the application of advanced monitoring technologies and extensive interpretation of multiple databases. During this encounter critical care time was devoted to patient care services described in this note for 35 minutes.  Julian Hy, DO 09/24/19 10:41 AM [facility]

## 2019-09-25 DIAGNOSIS — D649 Anemia, unspecified: Secondary | ICD-10-CM

## 2019-09-25 LAB — POCT I-STAT EG7
Bicarbonate: 25.5 mmol/L (ref 20.0–28.0)
Calcium, Ion: 1.03 mmol/L — ABNORMAL LOW (ref 1.15–1.40)
HCT: 28 % — ABNORMAL LOW (ref 36.0–46.0)
Hemoglobin: 9.5 g/dL — ABNORMAL LOW (ref 12.0–15.0)
O2 Saturation: 59 %
Patient temperature: 97.2
Potassium: 4.5 mmol/L (ref 3.5–5.1)
Sodium: 135 mmol/L (ref 135–145)
TCO2: 27 mmol/L (ref 22–32)
pCO2, Ven: 44.4 mmHg (ref 44.0–60.0)
pH, Ven: 7.364 (ref 7.250–7.430)
pO2, Ven: 31 mmHg — CL (ref 32.0–45.0)

## 2019-09-25 LAB — RENAL FUNCTION PANEL
Albumin: 1.7 g/dL — ABNORMAL LOW (ref 3.5–5.0)
Albumin: 1.6 g/dL — ABNORMAL LOW (ref 3.5–5.0)
Anion gap: 8 (ref 5–15)
Anion gap: 7 (ref 5–15)
BUN: 46 mg/dL — ABNORMAL HIGH (ref 8–23)
BUN: 35 mg/dL — ABNORMAL HIGH (ref 8–23)
CO2: 24 mmol/L (ref 22–32)
CO2: 25 mmol/L (ref 22–32)
Calcium: 6.8 mg/dL — ABNORMAL LOW (ref 8.9–10.3)
Calcium: 6.9 mg/dL — ABNORMAL LOW (ref 8.9–10.3)
Chloride: 103 mmol/L (ref 98–111)
Chloride: 103 mmol/L (ref 98–111)
Creatinine, Ser: 3.31 mg/dL — ABNORMAL HIGH (ref 0.44–1.00)
Creatinine, Ser: 2.6 mg/dL — ABNORMAL HIGH (ref 0.44–1.00)
GFR calc Af Amer: 15 mL/min — ABNORMAL LOW (ref >60)
GFR calc Af Amer: 20 mL/min — ABNORMAL LOW (ref >60)
GFR calc non Af Amer: 13 mL/min — ABNORMAL LOW (ref >60)
GFR calc non Af Amer: 17 mL/min — ABNORMAL LOW (ref >60)
Glucose, Bld: 100 mg/dL — ABNORMAL HIGH (ref 70–99)
Glucose, Bld: 108 mg/dL — ABNORMAL HIGH (ref 70–99)
Phosphorus: 4 mg/dL (ref 2.5–4.6)
Phosphorus: 3.1 mg/dL (ref 2.5–4.6)
Potassium: 4.4 mmol/L (ref 3.5–5.1)
Potassium: 4.2 mmol/L (ref 3.5–5.1)
Sodium: 135 mmol/L (ref 135–145)
Sodium: 135 mmol/L (ref 135–145)

## 2019-09-25 LAB — CBC
HCT: 30.9 % — ABNORMAL LOW (ref 36.0–46.0)
Hemoglobin: 10.4 g/dL — ABNORMAL LOW (ref 12.0–15.0)
MCH: 33.7 pg (ref 26.0–34.0)
MCHC: 33.7 g/dL (ref 30.0–36.0)
MCV: 100 fL (ref 80.0–100.0)
Platelets: 133 10*3/uL — ABNORMAL LOW (ref 150–400)
RBC: 3.09 MIL/uL — ABNORMAL LOW (ref 3.87–5.11)
RDW: 15.4 % (ref 11.5–15.5)
WBC: 7.6 10*3/uL (ref 4.0–10.5)
nRBC: 0.3 % — ABNORMAL HIGH (ref 0.0–0.2)

## 2019-09-25 LAB — PROTEIN ELECTROPHORESIS, SERUM
A/G Ratio: 1 (ref 0.7–1.7)
A/G Ratio: 1 (ref 0.7–1.7)
Albumin ELP: 2.3 g/dL — ABNORMAL LOW (ref 2.9–4.4)
Albumin ELP: 2.2 g/dL — ABNORMAL LOW (ref 2.9–4.4)
Alpha-1-Globulin: 0.2 g/dL (ref 0.0–0.4)
Alpha-1-Globulin: 0.2 g/dL (ref 0.0–0.4)
Alpha-2-Globulin: 1 g/dL (ref 0.4–1.0)
Alpha-2-Globulin: 1 g/dL (ref 0.4–1.0)
Beta Globulin: 0.7 g/dL (ref 0.7–1.3)
Beta Globulin: 0.7 g/dL (ref 0.7–1.3)
Gamma Globulin: 0.5 g/dL (ref 0.4–1.8)
Gamma Globulin: 0.4 g/dL (ref 0.4–1.8)
Globulin, Total: 2.4 g/dL (ref 2.2–3.9)
Globulin, Total: 2.3 g/dL (ref 2.2–3.9)
Total Protein ELP: 4.7 g/dL — ABNORMAL LOW (ref 6.0–8.5)
Total Protein ELP: 4.5 g/dL — ABNORMAL LOW (ref 6.0–8.5)

## 2019-09-25 LAB — MPO/PR-3 (ANCA) ANTIBODIES
ANCA Proteinase 3: <3.5 U/mL (ref 0.0–3.5)
Myeloperoxidase Abs: <9 U/mL (ref 0.0–9.0)

## 2019-09-25 LAB — APTT: aPTT: 151 seconds — ABNORMAL HIGH (ref 24–36)

## 2019-09-25 LAB — POCT ACTIVATED CLOTTING TIME
Activated Clotting Time: 175 seconds
Activated Clotting Time: 180 seconds

## 2019-09-25 LAB — MAGNESIUM: Magnesium: 2.1 mg/dL (ref 1.7–2.4)

## 2019-09-25 MED ORDER — DARBEPOETIN ALFA 60 MCG/0.3ML IJ SOSY
60.0000 ug | PREFILLED_SYRINGE | INTRAMUSCULAR | Status: DC
  Administered 2019-09-25 – 2019-10-02 (×2): 60 ug via SUBCUTANEOUS
  Filled 2019-09-25 (×2): qty 0.3

## 2019-09-25 MED ORDER — SODIUM CHLORIDE 0.9 % IV SOLN
125.0000 mg | Freq: Every day | INTRAVENOUS | Status: AC
  Administered 2019-09-25 – 2019-09-28 (×4): 125 mg via INTRAVENOUS
  Filled 2019-09-25 (×4): qty 10

## 2019-09-25 NOTE — Progress Notes (Signed)
Venous blood gas accidentally obtained through I-Stat using the wrong cartridge when an ACT cartridge  should have been used. POC edit sheet filled out and sent to the lab.

## 2019-09-25 NOTE — Progress Notes (Addendum)
Patient ID: Sophia Mcmahon, female   DOB: 1941-06-18, 78 y.o.   MRN: [id number]  [facility] Progress Note   Assessment/ Plan:   1. Acute kidney Injury on chronic kidney disease (baseline creatinine/renal function unclear): Acute kidney injury likely from acute cardiorenal syndrome versus ischemic ATN.  Transferred to [facility] after developing anuria/oliguria with worsening azotemia and hyperkalemia with low-dose pressor requirement; now off pressors with oral midodrine supplementation.  Continue CRRT with current efforts at volume unloading-net -3 L overnight.  Oliguric with 380 cc urine output overnight.  Anticipate that this will likely be short-term dialysis requirements. 2.  Acute exacerbation of congestive heart failure: Started on CRRT yesterday for volume unloading after admitted with volume overload/refractoriness to outpatient diuretic therapy.  Not on pressors at this time. 3.  Hyperkalemia: Corrected with CRRT. 4.  Anemia: Likely from chronic illness including chronic kidney disease and dilution and volume excess.  Status post IV iron and continue ESA.  Subjective:   CRRT machine changed yesterday after error message regarding effluent bag.  Started on [facility] with consequent mild hypotension.   Objective:   BP (!) 114/58 (BP Location: Right Arm)   Pulse (!) 55   Temp (!) 97.2 F (36.2 C) (Axillary)   Resp 18   Ht 5\' 8"  (1.727 m)   Wt 116.1 kg   SpO2 96%   BMI 38.92 kg/m   Intake/Output Summary (Last 24 hours) at 09/25/2019 8101 Last data filed at 09/25/2019 0800 Gross per 24 hour  Intake 540 ml  Output 3460 ml  Net -2920 ml   Weight change: -0.1 kg  Physical Exam: Gen: Resting comfortably in bed, on CRRT via right IJ dialysis catheter CVS: Pulse regular bradycardia, S1 and S2 normal Resp: Anteriorly clear to auscultation, no distinct rales or rhonchi Abd: Soft, obese, nontender Ext: 2-3+ anasarca  Imaging: Ct Abdomen Pelvis Wo Contrast  Result Date:  09/24/2019 CLINICAL DATA:  Acute renal failure. Unable to visualize left kidney on ultrasound. EXAM: CT ABDOMEN AND PELVIS WITHOUT CONTRAST TECHNIQUE: Multidetector CT imaging of the abdomen and pelvis was performed following the standard protocol without IV contrast. COMPARISON:  None. FINDINGS: Artifact over the posterior field of view over the lower thorax and upper abdomen possibly due to patient's arms. Lower chest: Moderate size right effusion and small left effusion with associated bibasilar atelectasis. Calcified plaque over the left anterior descending and right coronary arteries. Calcified plaque over the distal descending thoracic aorta. Generalized subcutaneous edema over the thorax. Hepatobiliary: Mild nodular contour to the liver. No focal liver mass. Gallbladder and biliary tree are normal. Pancreas: Normal. Spleen: Normal. Adrenals/Urinary Tract: Adrenal glands are normal. Kidneys are normal in size without hydronephrosis. No definite nephrolithiasis. Foley catheter present within a decompressed bladder. Stomach/Bowel: Stomach and small bowel are normal. Colon is within normal. Appendix is not well visualized. Vascular/Lymphatic: Mild-to-moderate calcified plaque over the abdominal aorta. No definite adenopathy. Reproductive: Suggestion previous hysterectomy. Other: No significant free fluid or focal inflammatory change. Diffuse subcutaneous edema. Musculoskeletal: Degenerative change of the spine and hips. IMPRESSION: No acute findings in the abdomen/pelvis. Moderate size right effusion and small left effusion with associated bibasilar atelectasis. Mild nodular contour to the liver which may be due to cirrhosis. Aortic Atherosclerosis (ICD10-I70.0). Atherosclerotic coronary artery disease. Electronically Signed   By: Marin Olp M.D.   On: 09/24/2019 12:27   Dg Chest Port 1 View  Result Date: 09/24/2019 CLINICAL DATA:  Central line placement EXAM: PORTABLE CHEST 1 VIEW COMPARISON:  09/22/2019  FINDINGS: Interval placement of right-sided central venous catheter, with tip overlying the SVC. No right pneumothorax. Enlarged cardiomediastinal silhouette with vascular congestion. Asymmetric opacity in the right thorax is likely layering effusion. Left pleural effusion remains. Continued dense consolidation at both bases. Possible calcified loose body at the right shoulder. IMPRESSION: 1. Right-sided central venous catheter tip over the SVC. No pneumothorax 2. Cardiomegaly with vascular congestion 3. Bilateral pleural effusions, likely layering on the right side. Persistent dense airspace disease at both lung bases. Electronically Signed   By: Donavan Foil M.D.   On: 09/24/2019 02:00    Labs: BMET Recent Labs  Lab 09/22/19 0121 09/22/19 0422 09/23/19 0425 09/24/19 0619 09/24/19 1612 09/25/19 0412 09/25/19 0436  NA 136 136 135 135 134* 135 135  K 4.8 4.9 5.3* 4.8 4.6 4.4 4.5  CL 104 103 103 103 102 103  --   CO2 19* 19* 22 22 21* 24  --   GLUCOSE 110* 106* 100* 112* 127* 100*  --   BUN 74* 78* 81* 74* 60* 46*  --   CREATININE 4.85* 4.88* 5.49* 5.10* 4.19* 3.31*  --   CALCIUM 7.6* 7.6* 7.3* 7.1* 7.0* 6.8*  --   PHOS  --   --   --  6.2* 5.2* 4.0  --    CBC Recent Labs  Lab 09/22/19 0422 09/23/19 0425 09/24/19 0619 09/25/19 0425 09/25/19 0436  WBC 8.8 9.6 9.6 7.6  --   HGB 9.7* 9.1* 9.2* 10.4* 9.5*  HCT 30.8*  29.7* 28.6* 27.4* 30.9* 28.0*  MCV 101.3* 102.9* 100.4* 100.0  --   PLT 178 175 155 133*  --     Medications:    . Chlorhexidine Gluconate Cloth  6 each Topical Daily  . feeding supplement (PRO-STAT SUGAR FREE 64)  30 mL Oral BID  . heparin  5,000 Units Subcutaneous Q8H  . levothyroxine  25 mcg Oral Q0600  . midodrine  5 mg Oral TID WC  . pantoprazole  40 mg Oral Daily  . phenytoin  300 mg Oral QHS  . sodium chloride flush  3 mL Intravenous Q12H   Elmarie Shiley, MD 09/25/2019, 8:12 AM

## 2019-09-25 NOTE — Progress Notes (Signed)
NAME:  Sophia Mcmahon, MRN:  [id number], DOB:  16-Oct-1941, LOS: 3 ADMISSION DATE:  09/21/2019, CONSULTATION DATE:  NA REFERRING MD:  Dyann Kief, CHIEF COMPLAINT:   Acute diastolic heart failure , And acute on chronic renal failure  Brief History   78 year old female admitted multiple medical comorbidities as listed below.  Being transferred from [facility] where she was admitted on 9/30 to [facility] on 10/2 in the setting of progressive cardiorenal syndrome, hypotension, and worsening acute on chronic renal failure.  History of present illness   78 year old female who was admitted initially to [facility] on 9/30 after being sent To the emergency room by her primary care provider for rising creatinine and 30 pound weight gain over a 6-week period.  She had been apparently followed in the outside setting initially seen by primary care provider on 9/26 with a serum creatinine of 3.8 at which time her torsemide was increased a recheck serum creatinine on 9/30 demonstrated a creatinine of 4.63.  She was admitted to the hospital, initial therapeutic interventions included: Gentle hydration, holding losartan and torsemide, and obtaining nephrology consultation.  She is seen by nephrology on 10/1 who felt her acute on chronic renal failure was the result of decompensated congestive heart failure, escalated diuresis and ARB therapy.  Renal ultrasound was ordered, laboratory work-up for glomerular nephrosis and nephrotic syndrome initiated and IV therapy continued.  The following day on 10/2 weight was up 1.67 kg serum creatinine had risen from 4.88-5.49 with a potassium now registered at 5.3.  Renal ultrasound suggested chronic medical renal disease, however they were unable to evaluate the left kidney, urine studies to date with low FENa consistent with cardiorenal syndrome or acute GN.  She was placed on dopamine infusion, antihypertensives held, transferring to [facility] for CRRT.  Upon arrival  to [facility], patient hemodynamics have improved. She is now off dopamine and off of supplemental O2.   Past Medical History  Coronary artery disease, obesity, obstructive sleep apnea, CKD (stage unknown), hypertension, hyperlipidemia.  Significant Hospital Events    9/30 demonstrated a creatinine of 4.63.  Admitted to the hospital, initial therapeutic interventions included: Gentle hydration, holding losartan and torsemide, and obtaining nephrology consultation.  10/1 who felt her acute on chronic renal failure was the result of decompensated congestive heart failure, escalated diuresis and ARB therapy.  IV therapy continued.   10/2 weight was up 1.67 kg serum creatinine had risen from 4.88-5.49; K 5. 3,  low FENa consistent with cardiorenal syndrome or acute GN.  She was placed on dopamine infusion, antihypertensives held, transfer process to [facility] initiated for CRRT need 10/3 arrives to [facility]. Weaned off dopamine, supplemental O2. iHd cath to be placed   Consults:  Renal 10/1  Cardiology 10/2  Procedures:   10/3> Trialysis catheter  Significant Diagnostic Tests:   Renal ultrasound 10/1: suggested chronic medical renal disease, however they were unable to evaluate the left kidney,  Echo 33/0:  LV systolic function is normal and actually hyperdynamic, RV systolic function is normal. She has grade II diastolic dysfunction with elevated LA pressure and severe biatrial enlargement, no significant valvuler abnormalities. Her low bp's are not cardiac related. With normal RV and LV function   GN eval 10/1:  Urine immunofixation>>> Anti-DNA antibody>>>  antistreptolysin O titer>>> Glomerular basement membrane antibodies>>> Total complement>>> C4> 19 C3>104 MPO/PR-3>> ANA,IFA (w/ reflex) UPEP/UIFE/Light chains/TP 24hr urine >> 24 hr protein>>> Protein electrophoresis >>> Kappa/lambda light chain ratio> 0.07 Kappa free  light chain> 74.6 (H) Lambda free light chain > 1062 (H)     Micro Data:  COVID 10/1: neg   Antimicrobials:  N/a   Interim history/subjective:  Overnight no acute events.  She denies complaints this morning.  Tolerating CRRT well without inotropic support.  Objective   Blood pressure (!) 110/48, pulse (!) 56, temperature (!) 97.2 F (36.2 C), temperature source Axillary, resp. rate 18, height 5\' 8"  (1.727 m), weight 116.1 kg, SpO2 95 %.        Intake/Output Summary (Last 24 hours) at 09/25/2019 0718 Last data filed at 09/25/2019 0700 Gross per 24 hour  Intake 560 ml  Output 3467 ml  Net -2907 ml   Filed Weights   09/23/19 2230 09/24/19 0430 09/25/19 0500  Weight: 116.2 kg 117.2 kg 116.1 kg    Examination: General: Sleeping in bed comfortably, awakens easily. HENT: Normocephalic, atraumatic, eyes anicteric Lungs: Breathing comfortably on room air, no tachypnea or conversational dyspnea.  Decreased basilar breath sounds, but clear to auscultation anteriorly.   cardiovascular: Bradycardic, regular rhythm, no murmurs Abdomen: Soft, nontender Extremities: Significant pitting edema throughout both lower extremities, no deformities Neuro: Awake and alert, moving all extremities spontaneously, answering questions appropriately Skin: No rashes or wounds, no erythema or drainage around right IJ trialysis  Resolved Hospital Problem list     Assessment & Plan:   Acute on chronic renal failure, rapidly progressive and of unknown etiology.  Requiring emergent CRRT for hypervolemia. Acute GN vs cardiorenal syndrome -renal US showing medical renal on right kidney; left not visible  -now w/ worsening cr and climbing K, heavy proteinuria (24 protein 5,602) -FeNa Pre-renal (0.2%) Plan -Continue ICU admission for CRRT nephrology's recommendations -Renal diet -Continue to hold PTA ARB, avoid nephrotoxic medications - Measure I/O; continue Foley to measure urine output  Acute on chronic diastolic HF w/ bilateral pleural effusions and  pulmonary edema Plan - Following removal via CRRT -Holding PTA antihypertensives -Goal map 65 or greater; would use Levophed if inotropic or vasopressor support is required.  Hypotension, but history of hypertension on multiple antihypertensives at home.  Suspect this is due to toxicity from accumulation due to renal failure. -EF and LV fxn suggesting not cardiogenic in nature Plan -Continue to monitor -Due to hold PTA antihypertensives -We will defer cosyntropin stim test if hypotension does not improve -If requiring inotrope or vasopressor support, would prefer to use norepinephrine. - Wean Midodrin as able  Acute respiratory failure 2/2 Pulmonary edema and bilateral pleural effusions  Hx OSA on home CPAP Plan -Ongoing volume removal with CRRT -Supplemental oxygen as needed to maintain saturations greater than 90% -Nocturnal BiPAP-when her family brings her machine from home.  Hyperkalemia in setting of worsening renal failure-improved Plan -Continue to monitor  Mild Macrocytic anemia, likely chronic due to progressive CKD -no evidence of bleeding  Plan -Continue to monitor -Transfuse for hemoglobin less than 7  Elderly elevated LFTs. Passive congestion is the most likely cause Plan -Trend liver function studies-repeat tomorrow  Hypothyroidism, TSH minimally elevated at 6.8 Plan -Continue PTA Synthroid -Consider increasing to 37.5 mcg daily if she remains hypotensive after several days of CRRT  H/o seizure  Plan -Continue PTA phenytoin  H/o GERD Plan -Continue home PPI    Best practice:  Diet: Renal diet  Pain/Anxiety/Delirium protocol (if indicated): NA VAP protocol (if indicated): NA DVT prophylaxis: SCD GI prophylaxis: pepcid  Glucose control: NA Mobility: bedrest  Code Status: full code  Family Communication: Updated patient at bedside, discussed  with daughter at bedside yesterday Disposition: critically ill needing ICU admission for titration of  vasoactive gtts and CRRT  Labs   CBC: Recent Labs  Lab 09/22/19 0121 09/22/19 0422 09/23/19 0425 09/24/19 0619 09/25/19 0425 09/25/19 0436  WBC 9.9 8.8 9.6 9.6 7.6  --   HGB 9.8* 9.7* 9.1* 9.2* 10.4* 9.5*  HCT 29.6* 30.8*  29.7* 28.6* 27.4* 30.9* 28.0*  MCV 99.7 101.3* 102.9* 100.4* 100.0  --   PLT 193 178 175 155 133*  --     Basic Metabolic Panel: Recent Labs  Lab 09/22/19 0422 09/23/19 0425 09/24/19 0619 09/24/19 1612 09/25/19 0412 09/25/19 0436  NA 136 135 135 134* 135 135  K 4.9 5.3* 4.8 4.6 4.4 4.5  CL 103 103 103 102 103  --   CO2 19* 22 22 21* 24  --   GLUCOSE 106* 100* 112* 127* 100*  --   BUN 78* 81* 74* 60* 46*  --   CREATININE 4.88* 5.49* 5.10* 4.19* 3.31*  --   CALCIUM 7.6* 7.3* 7.1* 7.0* 6.8*  --   MG  --   --  2.1  --  2.1  --   PHOS  --   --  6.2* 5.2* 4.0  --    GFR: Estimated Creatinine Clearance: 19.4 mL/min (A) (by C-G formula based on SCr of 3.31 mg/dL (H)). Recent Labs  Lab 09/22/19 0422 09/23/19 0425 09/24/19 0619 09/25/19 0425  WBC 8.8 9.6 9.6 7.6    Liver Function Tests: Recent Labs  Lab 09/22/19 0121 09/22/19 0422 09/23/19 0425 09/24/19 0619 09/24/19 1612 09/25/19 0412  AST 76* 73* 73*  --   --   --   ALT 47* 45* 45*  --   --   --   ALKPHOS 145* 140* 134*  --   --   --   BILITOT 0.4 0.3 0.3  --   --   --   PROT 5.3* 5.0* 4.6*  --   --   --   ALBUMIN 2.4* 2.2* 2.1* 1.8* 1.8* 1.7*   No results for input(s): LIPASE, AMYLASE in the last 168 hours. No results for input(s): AMMONIA in the last 168 hours.  ABG    Component Value Date/Time   HCO3 25.5 09/25/2019 0436   TCO2 27 09/25/2019 0436   O2SAT 59.0 09/25/2019 0436     Coagulation Profile: No results for input(s): INR, PROTIME in the last 168 hours.  Cardiac Enzymes: No results for input(s): CKTOTAL, CKMB, CKMBINDEX, TROPONINI in the last 168 hours.  HbA1C: No results found for: HGBA1C  CBG: Recent Labs  Lab 09/23/19 2230 09/24/19 2008  GLUCAP 109*  107*   This patient is critically ill with multiple organ system failure which requires frequent high complexity decision making, assessment, support, evaluation, and titration of therapies. This was completed through the application of advanced monitoring technologies and extensive interpretation of multiple databases. During this encounter critical care time was devoted to patient care services described in this note for 35 minutes.  Julian Hy, DO 09/25/19 8:28 AM [facility]

## 2019-09-26 LAB — UPEP/UIFE/LIGHT CHAINS/TP, 24-HR UR
% BETA, Urine: 6.6 %
ALPHA 1 URINE: 0.4 %
Albumin, U: 81.6 %
Alpha 2, Urine: 6.3 %
Free Kappa Lt Chains,Ur: 310.72 mg/L — ABNORMAL HIGH (ref 0.63–113.79)
Free Kappa/Lambda Ratio: 0.17 — ABNORMAL LOW (ref 1.03–31.76)
Free Lambda Lt Chains,Ur: 1817.02 mg/L — ABNORMAL HIGH (ref 0.47–11.77)
GAMMA GLOBULIN URINE: 5.1 %
M-SPIKE %, Urine: 2.1 % — ABNORMAL HIGH
M-Spike, Mg/24 Hr: 116 mg/24 hr — ABNORMAL HIGH
Total Protein, Urine-Ur/day: 5502 mg/24 hr — ABNORMAL HIGH (ref 30–150)
Total Protein, Urine: 2000.8 mg/dL
Total Volume: 275

## 2019-09-26 LAB — HEPATIC FUNCTION PANEL
ALT: 33 U/L (ref 0–44)
AST: 52 U/L — ABNORMAL HIGH (ref 15–41)
Albumin: 1.7 g/dL — ABNORMAL LOW (ref 3.5–5.0)
Alkaline Phosphatase: 145 U/L — ABNORMAL HIGH (ref 38–126)
Bilirubin, Direct: <0.1 mg/dL (ref 0.0–0.2)
Total Bilirubin: 0.2 mg/dL — ABNORMAL LOW (ref 0.3–1.2)
Total Protein: 4.3 g/dL — ABNORMAL LOW (ref 6.5–8.1)

## 2019-09-26 LAB — CBC
HCT: 28.2 % — ABNORMAL LOW (ref 36.0–46.0)
Hemoglobin: 9.2 g/dL — ABNORMAL LOW (ref 12.0–15.0)
MCH: 32.7 pg (ref 26.0–34.0)
MCHC: 32.6 g/dL (ref 30.0–36.0)
MCV: 100.4 fL — ABNORMAL HIGH (ref 80.0–100.0)
Platelets: 174 10*3/uL (ref 150–400)
RBC: 2.81 MIL/uL — ABNORMAL LOW (ref 3.87–5.11)
RDW: 15.5 % (ref 11.5–15.5)
WBC: 11.1 10*3/uL — ABNORMAL HIGH (ref 4.0–10.5)
nRBC: 0.2 % (ref 0.0–0.2)

## 2019-09-26 LAB — RENAL FUNCTION PANEL
Albumin: 1.6 g/dL — ABNORMAL LOW (ref 3.5–5.0)
Albumin: 1.5 g/dL — ABNORMAL LOW (ref 3.5–5.0)
Anion gap: 12 (ref 5–15)
Anion gap: 7 (ref 5–15)
BUN: 26 mg/dL — ABNORMAL HIGH (ref 8–23)
BUN: 21 mg/dL (ref 8–23)
CO2: 25 mmol/L (ref 22–32)
CO2: 25 mmol/L (ref 22–32)
Calcium: 6.8 mg/dL — ABNORMAL LOW (ref 8.9–10.3)
Calcium: 6.9 mg/dL — ABNORMAL LOW (ref 8.9–10.3)
Chloride: 102 mmol/L (ref 98–111)
Chloride: 104 mmol/L (ref 98–111)
Creatinine, Ser: 2.24 mg/dL — ABNORMAL HIGH (ref 0.44–1.00)
Creatinine, Ser: 1.98 mg/dL — ABNORMAL HIGH (ref 0.44–1.00)
GFR calc Af Amer: 24 mL/min — ABNORMAL LOW (ref >60)
GFR calc Af Amer: 28 mL/min — ABNORMAL LOW (ref >60)
GFR calc non Af Amer: 21 mL/min — ABNORMAL LOW (ref >60)
GFR calc non Af Amer: 24 mL/min — ABNORMAL LOW (ref >60)
Glucose, Bld: 84 mg/dL (ref 70–99)
Glucose, Bld: 110 mg/dL — ABNORMAL HIGH (ref 70–99)
Phosphorus: 2.6 mg/dL (ref 2.5–4.6)
Phosphorus: 2.2 mg/dL — ABNORMAL LOW (ref 2.5–4.6)
Potassium: 4.4 mmol/L (ref 3.5–5.1)
Potassium: 4.3 mmol/L (ref 3.5–5.1)
Sodium: 139 mmol/L (ref 135–145)
Sodium: 136 mmol/L (ref 135–145)

## 2019-09-26 LAB — APTT: aPTT: >200 seconds (ref 24–36)

## 2019-09-26 LAB — MAGNESIUM: Magnesium: 2.3 mg/dL (ref 1.7–2.4)

## 2019-09-26 LAB — HEMOGLOBIN A1C
Hgb A1c MFr Bld: 4.5 % — ABNORMAL LOW (ref 4.8–5.6)
Mean Plasma Glucose: 82.45 mg/dL

## 2019-09-26 LAB — PHENYTOIN LEVEL, TOTAL: Phenytoin Lvl: 4.2 ug/mL — ABNORMAL LOW (ref 10.0–20.0)

## 2019-09-26 LAB — CYTOLOGY - NON PAP

## 2019-09-26 LAB — ANTINUCLEAR ANTIBODIES, IFA: ANA Ab, IFA: NEGATIVE

## 2019-09-26 LAB — POCT ACTIVATED CLOTTING TIME: Activated Clotting Time: 208 seconds

## 2019-09-26 MED ORDER — ENSURE ENLIVE PO LIQD
237.0000 mL | Freq: Two times a day (BID) | ORAL | Status: DC
  Administered 2019-09-26 – 2019-10-06 (×15): 237 mL via ORAL

## 2019-09-26 MED ORDER — DOCUSATE SODIUM 100 MG PO CAPS
100.0000 mg | ORAL_CAPSULE | Freq: Every day | ORAL | Status: DC
  Administered 2019-09-26 – 2019-10-07 (×9): 100 mg via ORAL
  Filled 2019-09-26 (×11): qty 1

## 2019-09-26 MED ORDER — SODIUM PHOSPHATES 45 MMOLE/15ML IV SOLN
20.0000 mmol | Freq: Once | INTRAVENOUS | Status: AC
  Administered 2019-09-26: 20 mmol via INTRAVENOUS
  Filled 2019-09-26: qty 6.67

## 2019-09-26 NOTE — Progress Notes (Addendum)
NAME:  Sophia Mcmahon, MRN:  [id number], DOB:  January 19, 1941, LOS: 4 ADMISSION DATE:  09/21/2019, CONSULTATION DATE:  NA REFERRING MD:  Dyann Kief, CHIEF COMPLAINT:   Acute diastolic heart failure , And acute on chronic renal failure  Brief History   78 year old female admitted 9/30 to [facility] with 30lb weight gain in 6 weeks & progressive renal failure, tx to [facility] on 10/2 in the setting of progressive cardiorenal syndrome, hypotension, and worsening acute on chronic renal failure.  Past Medical History  Coronary artery disease, obesity, obstructive sleep apnea, CKD (stage unknown), hypertension, hyperlipidemia.  Significant Hospital Events    9/30 Admit to [facility] with sr cr 4.63.  Initial therapeutic interventions included: Gentle hydration, holding losartan and torsemide, and obtaining nephrology consultation.  10/1 Acute on chronic renal failure was the result of decompensated congestive heart failure, escalated diuresis and ARB therapy.  IV therapy continued.   10/2 Weight was up 1.67 kg serum creatinine had risen from 4.88-5.49; K 5. 3,  low FENa consistent with cardiorenal syndrome or acute GN.  She was placed on dopamine infusion, antihypertensives held, transfer process to [facility] initiated for CRRT need 10/3 Tx to [facility]. Weaned off dopamine, supplemental O2. iHd cath to be placed  Consults:  Renal 10/1  Cardiology 10/2  Procedures:  R HD Cath 10/3 >>   Significant Diagnostic Tests:  Renal ultrasound 10/1 >> suggested chronic medical renal disease, however they were unable to evaluate the left kidney Echo 48/5 >>  LV systolic function is normal and actually hyperdynamic, RV systolic function is normal. She has grade II diastolic dysfunction with elevated LA pressure and severe biatrial enlargement, no significant valvuler abnormalities. Her low bp's are not cardiac related. With normal RV and LV function   GN eval 10/1:  U. Creatinine >> 203 Urine immunofixation  >> Anti-DNA antibody >> negative Antistreptolysin O titer >> negative  Glomerular basement membrane antibodies >> negative Total complement >> 45 C4 >> 19 C3 >> 104 MPO/PR-3 (ANCA) >> negative ANA,IFA (w/ reflex) >> UPEP/UIFE/Light chains/TP 24hr urine >>  24 hr protein >> 5,602 (H) Protein electrophoresis  >> Kappa/lambda light chain ratio >> 0.07 Kappa free light chain >> 74.6 (H) Lambda free light chain >> 1062 (H)   Micro Data:  COVID 10/1 >> neg   Antimicrobials:    Interim history/subjective:  Pt reports feeling better overall. No acute complaints.  UOP 310 in last 24 hours, net neg 4.8L in last 24 hours. Afebrile.   Objective   Blood pressure 105/61, pulse 63, temperature 97.7 F (36.5 C), temperature source Oral, resp. rate 18, height 5\' 8"  (1.727 m), weight 109.1 kg, SpO2 95 %.        Intake/Output Summary (Last 24 hours) at 09/26/2019 0759 Last data filed at 09/26/2019 0726 Gross per 24 hour  Intake 820.15 ml  Output 5659 ml  Net -4838.85 ml   Filed Weights   09/24/19 0430 09/25/19 0500 09/26/19 0500  Weight: 117.2 kg 116.1 kg 109.1 kg    Examination: General:  Pleasant, elderly female lying in bed in NAD on CVVHD HEENT: MM pink/moist, no JVD, R IJ HD cath  Neuro: AAOx4, MAE CV: s1s2 rrr, ? 2-3/6 murmur heard best MCL 5th ICS  PULM: even/non-labored on RA, lungs bilaterally clear GI: soft, bsx4 active  Extremities: warm/dry, pitting edema up to abdomen  Skin: no rashes or lesions  Resolved Hospital Problem list     Assessment & Plan:   Acute on  chronic renal failure -initially thought acute cardiorenal syndrome vs ischemic ATN. No clear precipitating events.  Work up pending.  -FeNa (0.2%) pre-renal, renal US with medical renal disease on R, L not visible  P: Appreciate Nephrology Volume removal, goal net neg 173ml/hr  Renal diet  Trend BMP / urinary output Replace electrolytes as indicated  Avoid nephrotoxic agents, ensure adequate renal  perfusion  Acute on Chronic diastolic HF  -w/ bilateral pleural effusions and pulmonary edema P: Follow I/O's, daily weights Follow CXR / effusions.  No role for thoracentesis at this time.   Hypotension Hx hypertension  -on multiple antihypertensives at home.  Suspect this is due to toxicity from accumulation d/t renal failure. -EF and LV fxn suggesting not cardiogenic in nature P: Hold home antihypertensives  MAP goal >65, if requiring vasopressors, utilize levophed  Monitor BP trend in ICU  Continue midodrine 10/5, consider weaning 10/6 if BP remains stable   Acute respiratory failure 2/2 Pulmonary edema and Bilateral Pleural Effusions  Hx OSA on home CPAP P: CVVHD for volume removal  Nocturnal BiPAP, family to bring patients machine   Hyperkalemia in setting of worsening renal failure-improved P: Monitor  Mild Macrocytic anemia, likely chronic due to progressive CKD -no evidence of bleeding  P: Trend CBC  Transfuse per ICU guidelines  Elevated LFTs.  -suspect passive congestion is the most likely cause P: Follow LFT's   Hypothyroidism, TSH minimally elevated at 6.8 P: Continue Synthroid   H/o seizure  P: Continue Phenytoin   H/o GERD P: PPI   Severe Malnutrition  P: Continue prostat Add Ensure   Best practice:  Diet: Renal diet  Pain/Anxiety/Delirium protocol (if indicated): NA VAP protocol (if indicated): NA DVT prophylaxis: SCD GI prophylaxis: pepcid  Glucose control: NA Mobility: bedrest  Code Status: full code  Family Communication: Updated patient at bedside 10/5 Disposition: ICU   Labs   CBC: Recent Labs  Lab 09/22/19 0422 09/23/19 0425 09/24/19 0619 09/25/19 0425 09/25/19 0436 09/26/19 0700  WBC 8.8 9.6 9.6 7.6  --  11.1*  HGB 9.7* 9.1* 9.2* 10.4* 9.5* 9.2*  HCT 30.8*  29.7* 28.6* 27.4* 30.9* 28.0* 28.2*  MCV 101.3* 102.9* 100.4* 100.0  --  100.4*  PLT 178 175 155 133*  --  161    Basic Metabolic Panel: Recent Labs   Lab 09/24/19 0619 09/24/19 1612 09/25/19 0412 09/25/19 0436 09/25/19 1605 09/26/19 0420  NA 135 134* 135 135 135 139  K 4.8 4.6 4.4 4.5 4.2 4.4  CL 103 102 103  --  103 102  CO2 22 21* 24  --  25 25  GLUCOSE 112* 127* 100*  --  108* 84  BUN 74* 60* 46*  --  35* 26*  CREATININE 5.10* 4.19* 3.31*  --  2.60* 2.24*  CALCIUM 7.1* 7.0* 6.8*  --  6.9* 6.8*  MG 2.1  --  2.1  --   --  2.3  PHOS 6.2* 5.2* 4.0  --  3.1 2.6   GFR: Estimated Creatinine Clearance: 27.7 mL/min (A) (by C-G formula based on SCr of 2.24 mg/dL (H)). Recent Labs  Lab 09/23/19 0425 09/24/19 0619 09/25/19 0425 09/26/19 0700  WBC 9.6 9.6 7.6 11.1*    Liver Function Tests: Recent Labs  Lab 09/22/19 0121 09/22/19 0422 09/23/19 0425 09/24/19 0619 09/24/19 1612 09/25/19 0412 09/25/19 1605 09/26/19 0420  AST 76* 73* 73*  --   --   --   --  52*  ALT 47* 45* 45*  --   --   --   --  33  ALKPHOS 145* 140* 134*  --   --   --   --  145*  BILITOT 0.4 0.3 0.3  --   --   --   --  0.2*  PROT 5.3* 5.0* 4.6*  --   --   --   --  4.3*  ALBUMIN 2.4* 2.2* 2.1* 1.8* 1.8* 1.7* 1.6* 1.7*  1.6*   No results for input(s): LIPASE, AMYLASE in the last 168 hours. No results for input(s): AMMONIA in the last 168 hours.  ABG    Component Value Date/Time   HCO3 25.5 09/25/2019 0436   TCO2 27 09/25/2019 0436   O2SAT 59.0 09/25/2019 0436     Coagulation Profile: No results for input(s): INR, PROTIME in the last 168 hours.  Cardiac Enzymes: No results for input(s): CKTOTAL, CKMB, CKMBINDEX, TROPONINI in the last 168 hours.  HbA1C: Hgb A1c MFr Bld  Date/Time Value Ref Range Status  09/26/2019 07:00 AM 4.5 (L) 4.8 - 5.6 % Final    Comment:    (NOTE) Pre diabetes:          5.7%-6.4% Diabetes:              >6.4% Glycemic control for   <7.0% adults with diabetes     CBG: Recent Labs  Lab 09/23/19 2230 09/24/19 2008  GLUCAP 109* 107*    CRITICAL CARE Performed by: Noe Gens   Total critical care time:  30 minutes  Critical care time was exclusive of separately billable procedures and treating other patients.  Critical care was necessary to treat or prevent imminent or life-threatening deterioration.  Critical care was time spent personally by me on the following activities: development of treatment plan with patient and/or surrogate as well as nursing, discussions with consultants, evaluation of patient's response to treatment, examination of patient, obtaining history from patient or surrogate, ordering and performing treatments and interventions, ordering and review of laboratory studies, ordering and review of radiographic studies, pulse oximetry and re-evaluation of patient's condition.  Noe Gens, NP-C [facility] Pgr: [phone] or if no answer [phone] 09/26/2019, 8:00 AM

## 2019-09-26 NOTE — Progress Notes (Signed)
CRITICAL VALUE ALERT  Critical Value: APTT   Date & Time Notied:  09/26/19 6:10   Provider Notified: Dr Royce Macadamia   Orders Received/Actions taken: Per protocol. Stop CRRT heparin.

## 2019-09-26 NOTE — TOC Initial Note (Signed)
Transition of Care [facility]) - Initial/Assessment Note    Patient Details  Name: Sophia Mcmahon MRN: [id number] Date of Birth: 17-Oct-1941  Transition of Care [facility]) CM/SW Contact:    Bartholomew Crews, RN Phone Number: [phone] 09/26/2019, 12:09 PM  Clinical Narrative:                 Spoke with patient and daughter at the bedside. PTA patient home alone, however, her granddaughter had been staying with her recently. Daughter states that patient will be staying with her at discharged and is not interested in rehab services. Daughter is a Armed forces operational officer and aware of options available. No DME. No HH. PCP verified as correct in Epic. No medication management concerns.   Following for transition of care needs.   Expected Discharge Plan: Home/Self Care Barriers to Discharge: Continued Medical Work up   Patient Goals and CMS Choice Patient states their goals for this hospitalization and ongoing recovery are:: return home with daughter      Expected Discharge Plan and Services Expected Discharge Plan: Home/Self Care   Discharge Planning Services: CM Consult     Expected Discharge Date: 09/25/19                                    Prior Living Arrangements/Services   Lives with:: Self Patient language and need for interpreter reviewed:: Yes Do you feel safe going back to the place where you live?: Yes      Need for Family Participation in Patient Care: Yes (Comment) Care giver support system in place?: Yes (comment)   Criminal Activity/Legal Involvement Pertinent to Current Situation/Hospitalization: No - Comment as needed  Activities of Daily Living Home Assistive Devices/Equipment: CPAP, Blood pressure cuff ADL Screening (condition at time of admission) Patient's cognitive ability adequate to safely complete daily activities?: Yes Is the patient deaf or have difficulty hearing?: No Does the patient have difficulty seeing, even when wearing glasses/contacts?: No Does  the patient have difficulty concentrating, remembering, or making decisions?: No Patient able to express need for assistance with ADLs?: Yes Does the patient have difficulty dressing or bathing?: No Independently performs ADLs?: Yes (appropriate for developmental age) Does the patient have difficulty walking or climbing stairs?: No Weakness of Legs: None Weakness of Arms/Hands: None  Permission Sought/Granted Permission sought to share information with : Family Supports Permission granted to share information with : Yes, Verbal Permission Granted        Permission granted to share info w Relationship: daughter     Emotional Assessment Appearance:: Appears stated age Attitude/Demeanor/Rapport: Engaged Affect (typically observed): Accepting Orientation: : Oriented to Self, Oriented to Place, Oriented to  Time, Oriented to Situation Alcohol / Substance Use: Not Applicable Psych Involvement: No (comment)  Admission diagnosis:  Anasarca [R60.1] ARF (acute renal failure) (HCC) [N17.9] Abnormal liver function [R94.5] Acute renal failure superimposed on chronic kidney disease, unspecified CKD stage, unspecified acute renal failure type (HCC) [N17.9, N18.9] Acute on chronic congestive heart failure, unspecified heart failure type [facility]) [I50.9] Patient Active Problem List   Diagnosis Date Noted  . Acute renal failure (ARF) ([place]) 09/24/2019  . Anasarca   . Acute on chronic diastolic CHF (congestive heart failure) ([place])   . Cardiorenal syndrome   . Acute renal failure superimposed on chronic kidney disease ([place]) 09/22/2019  . Protein-calorie malnutrition, severe ([place]) 09/22/2019  . Abnormal liver function 09/22/2019  . Anemia 09/22/2019  PCP:  Frances Maywood, FNP Pharmacy:  No Pharmacies Listed    Social Determinants of Health (SDOH) Interventions    Readmission Risk Interventions No flowsheet data found.

## 2019-09-26 NOTE — Progress Notes (Signed)
Patient ID: Sophia Mcmahon, female   DOB: 05-18-1941, 78 y.o.   MRN: [id number]   [facility] Progress Note   Assessment/ Plan:   1. Acute kidney Injury on chronic kidney disease (baseline creatinine/renal function unclear): Acute kidney injury likely from acute cardiorenal syndrome versus ischemic ATN.  Transferred to [facility] after developing anuria/oliguria with worsening azotemia and hyperkalemia with low-dose pressor requirement; now off pressors with oral midodrine supplementation.  Note anti-PR3 and anti-MPO neg. Complement normal. GBM neg.  Hep B and hep C neg. - Continue CRRT.  Net neg 150 ml/hr - Anticipate that this will likely be short-term dialysis requirement  2.  Acute exacerbation of congestive heart failure: Started on CRRT for volume unloading.  Admitted with volume overload/refractory to outpatient diuretic therapy.  Not on pressors at this time but on midodrine.  3.  Hyperkalemia: Corrected with CRRT.  4.  Anemia - acute : Likely from chronic illness including chronic kidney disease and dilution and volume excess.  Status post IV iron and continue ESA.  CBC now and daily (placed orders)  5. Proteinuria - no concurrent hematuria.  Note GN work-up as above  - note low free light chain ratio - add on hemoglobin A1c - ANA is in process  - SPEP no M spike and UPEP is ordered   5. Abnormal free light chain ratio - concern for plasma cell dyscrasia - note free light chain ratio low at 0.07  Subjective:   Had 310 mL UOP over 10/4 charted.  Had 5.2 liters UF over 10/4 charted thus far - has a goal of neg 200 cc/hr.  No clotting.   Review of systems:  Denies shortness of breath or chest pain  Denies nausea or vomiting  Urinating via foley  Has remained in the bed     Objective:   BP 121/62   Pulse 62   Temp 98.2 F (36.8 C) (Oral)   Resp 18   Ht 5\' 8"  (1.727 m)   Wt 109.1 kg   SpO2 95%   BMI 36.57 kg/m   Intake/Output Summary (Last 24 hours) at 09/26/2019  0616 Last data filed at 09/26/2019 0600 Gross per 24 hour  Intake 770.15 ml  Output 5424 ml  Net -4653.85 ml   Weight change: -7 kg  Physical Exam: Gen: Resting comfortably in bed, on CRRT  CVS: Pulse regular, S1 and S2 normal Resp: Anteriorly clear to auscultation, no distinct rales or rhonchi Abd: Soft, obese, nontender Ext: 1+ edema extremities bilaterally  Access: RIJ nontunneled dialysis catheter  Imaging: Ct Abdomen Pelvis Wo Contrast  Result Date: 09/24/2019 CLINICAL DATA:  Acute renal failure. Unable to visualize left kidney on ultrasound. EXAM: CT ABDOMEN AND PELVIS WITHOUT CONTRAST TECHNIQUE: Multidetector CT imaging of the abdomen and pelvis was performed following the standard protocol without IV contrast. COMPARISON:  None. FINDINGS: Artifact over the posterior field of view over the lower thorax and upper abdomen possibly due to patient's arms. Lower chest: Moderate size right effusion and small left effusion with associated bibasilar atelectasis. Calcified plaque over the left anterior descending and right coronary arteries. Calcified plaque over the distal descending thoracic aorta. Generalized subcutaneous edema over the thorax. Hepatobiliary: Mild nodular contour to the liver. No focal liver mass. Gallbladder and biliary tree are normal. Pancreas: Normal. Spleen: Normal. Adrenals/Urinary Tract: Adrenal glands are normal. Kidneys are normal in size without hydronephrosis. No definite nephrolithiasis. Foley catheter present within a decompressed bladder. Stomach/Bowel: Stomach and small bowel are normal. Colon is  within normal. Appendix is not well visualized. Vascular/Lymphatic: Mild-to-moderate calcified plaque over the abdominal aorta. No definite adenopathy. Reproductive: Suggestion previous hysterectomy. Other: No significant free fluid or focal inflammatory change. Diffuse subcutaneous edema. Musculoskeletal: Degenerative change of the spine and hips. IMPRESSION: No acute  findings in the abdomen/pelvis. Moderate size right effusion and small left effusion with associated bibasilar atelectasis. Mild nodular contour to the liver which may be due to cirrhosis. Aortic Atherosclerosis (ICD10-I70.0). Atherosclerotic coronary artery disease. Electronically Signed   By: Marin Olp M.D.   On: 09/24/2019 12:27    Labs: BMET Recent Labs  Lab 09/22/19 0422 09/23/19 0425 09/24/19 3838 09/24/19 1612 09/25/19 0412 09/25/19 0436 09/25/19 1605 09/26/19 0420  NA 136 135 135 134* 135 135 135 139  K 4.9 5.3* 4.8 4.6 4.4 4.5 4.2 4.4  CL 103 103 103 102 103  --  103 102  CO2 19* 22 22 21* 24  --  25 25  GLUCOSE 106* 100* 112* 127* 100*  --  108* 84  BUN 78* 81* 74* 60* 46*  --  35* 26*  CREATININE 4.88* 5.49* 5.10* 4.19* 3.31*  --  2.60* 2.24*  CALCIUM 7.6* 7.3* 7.1* 7.0* 6.8*  --  6.9* 6.8*  PHOS  --   --  6.2* 5.2* 4.0  --  3.1 2.6   CBC Recent Labs  Lab 09/22/19 0422 09/23/19 0425 09/24/19 0619 09/25/19 0425 09/25/19 0436  WBC 8.8 9.6 9.6 7.6  --   HGB 9.7* 9.1* 9.2* 10.4* 9.5*  HCT 30.8*  29.7* 28.6* 27.4* 30.9* 28.0*  MCV 101.3* 102.9* 100.4* 100.0  --   PLT 178 175 155 133*  --     Medications:    . Chlorhexidine Gluconate Cloth  6 each Topical Daily  . darbepoetin (ARANESP) injection - NON-DIALYSIS  60 mcg Subcutaneous Q Sun-1800  . feeding supplement (PRO-STAT SUGAR FREE 64)  30 mL Oral BID  . heparin  5,000 Units Subcutaneous Q8H  . levothyroxine  25 mcg Oral Q0600  . midodrine  5 mg Oral TID WC  . pantoprazole  40 mg Oral Daily  . phenytoin  300 mg Oral QHS  . sodium chloride flush  3 mL Intravenous Q12H   Claudia Desanctis 09/26/2019, 6:16 AM

## 2019-09-27 ENCOUNTER — Inpatient Hospital Stay (HOSPITAL_COMMUNITY): Payer: Medicare Other

## 2019-09-27 DIAGNOSIS — J90 Pleural effusion, not elsewhere classified: Secondary | ICD-10-CM

## 2019-09-27 LAB — CBC
HCT: 26.2 % — ABNORMAL LOW (ref 36.0–46.0)
Hemoglobin: 8.8 g/dL — ABNORMAL LOW (ref 12.0–15.0)
MCH: 33.7 pg (ref 26.0–34.0)
MCHC: 33.6 g/dL (ref 30.0–36.0)
MCV: 100.4 fL — ABNORMAL HIGH (ref 80.0–100.0)
Platelets: 144 10*3/uL — ABNORMAL LOW (ref 150–400)
RBC: 2.61 MIL/uL — ABNORMAL LOW (ref 3.87–5.11)
RDW: 15.5 % (ref 11.5–15.5)
WBC: 9.9 10*3/uL (ref 4.0–10.5)
nRBC: 0.2 % (ref 0.0–0.2)

## 2019-09-27 LAB — RENAL FUNCTION PANEL
Albumin: 1.4 g/dL — ABNORMAL LOW (ref 3.5–5.0)
Anion gap: 7 (ref 5–15)
BUN: 20 mg/dL (ref 8–23)
CO2: 24 mmol/L (ref 22–32)
Calcium: 7.1 mg/dL — ABNORMAL LOW (ref 8.9–10.3)
Chloride: 103 mmol/L (ref 98–111)
Creatinine, Ser: 2.28 mg/dL — ABNORMAL HIGH (ref 0.44–1.00)
GFR calc Af Amer: 23 mL/min — ABNORMAL LOW (ref >60)
GFR calc non Af Amer: 20 mL/min — ABNORMAL LOW (ref >60)
Glucose, Bld: 118 mg/dL — ABNORMAL HIGH (ref 70–99)
Phosphorus: 2.1 mg/dL — ABNORMAL LOW (ref 2.5–4.6)
Potassium: 4.3 mmol/L (ref 3.5–5.1)
Sodium: 134 mmol/L — ABNORMAL LOW (ref 135–145)

## 2019-09-27 LAB — COMPREHENSIVE METABOLIC PANEL
ALT: 36 U/L (ref 0–44)
AST: 61 U/L — ABNORMAL HIGH (ref 15–41)
Albumin: 1.4 g/dL — ABNORMAL LOW (ref 3.5–5.0)
Alkaline Phosphatase: 149 U/L — ABNORMAL HIGH (ref 38–126)
Anion gap: 6 (ref 5–15)
BUN: 17 mg/dL (ref 8–23)
CO2: 26 mmol/L (ref 22–32)
Calcium: 6.5 mg/dL — ABNORMAL LOW (ref 8.9–10.3)
Chloride: 103 mmol/L (ref 98–111)
Creatinine, Ser: 1.84 mg/dL — ABNORMAL HIGH (ref 0.44–1.00)
GFR calc Af Amer: 30 mL/min — ABNORMAL LOW (ref >60)
GFR calc non Af Amer: 26 mL/min — ABNORMAL LOW (ref >60)
Glucose, Bld: 90 mg/dL (ref 70–99)
Potassium: 4.1 mmol/L (ref 3.5–5.1)
Sodium: 135 mmol/L (ref 135–145)
Total Bilirubin: 0.3 mg/dL (ref 0.3–1.2)
Total Protein: 3.9 g/dL — ABNORMAL LOW (ref 6.5–8.1)

## 2019-09-27 LAB — PHOSPHORUS: Phosphorus: 2.8 mg/dL (ref 2.5–4.6)

## 2019-09-27 LAB — LACTATE DEHYDROGENASE, PLEURAL OR PERITONEAL FLUID: LD, Fluid: 40 U/L — ABNORMAL HIGH (ref 3–23)

## 2019-09-27 LAB — PROTEIN, PLEURAL OR PERITONEAL FLUID: Total protein, fluid: <3 g/dL

## 2019-09-27 LAB — IMMUNOFIXATION, URINE

## 2019-09-27 LAB — MAGNESIUM: Magnesium: 2 mg/dL (ref 1.7–2.4)

## 2019-09-27 LAB — APTT: aPTT: 38 seconds — ABNORMAL HIGH (ref 24–36)

## 2019-09-27 LAB — PROTEIN, TOTAL: Total Protein: 3.9 g/dL — ABNORMAL LOW (ref 6.5–8.1)

## 2019-09-27 LAB — LACTATE DEHYDROGENASE: LDH: 256 U/L — ABNORMAL HIGH (ref 98–192)

## 2019-09-27 MED ORDER — SODIUM PHOSPHATES 45 MMOLE/15ML IV SOLN
10.0000 mmol | Freq: Once | INTRAVENOUS | Status: AC
  Administered 2019-09-27: 10 mmol via INTRAVENOUS
  Filled 2019-09-27: qty 3.33

## 2019-09-27 MED ORDER — CHLORHEXIDINE GLUCONATE CLOTH 2 % EX PADS
6.0000 | MEDICATED_PAD | Freq: Every day | CUTANEOUS | Status: DC
  Administered 2019-09-28: 6 via TOPICAL

## 2019-09-27 MED ORDER — HEPARIN SODIUM (PORCINE) 1000 UNIT/ML DIALYSIS
1000.0000 [IU] | INTRAMUSCULAR | Status: DC | PRN
  Administered 2019-09-27: 4000 [IU] via INTRAVENOUS_CENTRAL
  Filled 2019-09-27 (×4): qty 6

## 2019-09-27 MED ORDER — LIDOCAINE HCL (PF) 1 % IJ SOLN
INTRAMUSCULAR | Status: AC
  Administered 2019-09-27: 14:00:00
  Filled 2019-09-27: qty 5

## 2019-09-27 MED ORDER — MIDODRINE HCL 5 MG PO TABS
10.0000 mg | ORAL_TABLET | Freq: Three times a day (TID) | ORAL | Status: DC
  Administered 2019-09-28 – 2019-10-07 (×29): 10 mg via ORAL
  Filled 2019-09-27 (×29): qty 2

## 2019-09-27 NOTE — Progress Notes (Signed)
NAME:  Sophia Mcmahon, MRN:  [id number], DOB:  08-31-1941, LOS: 5 ADMISSION DATE:  09/21/2019, CONSULTATION DATE:  NA REFERRING MD:  Dyann Kief, CHIEF COMPLAINT:   Acute diastolic heart failure , And acute on chronic renal failure  Brief History   78 year old female admitted 9/30 to [facility] with 30lb weight gain in 6 weeks & progressive renal failure, tx to [facility] on 10/2 in the setting of progressive cardiorenal syndrome, hypotension, and worsening acute on chronic renal failure.  Past Medical History  Coronary artery disease, obesity, obstructive sleep apnea, CKD (stage unknown), hypertension, hyperlipidemia.  Significant Hospital Events    9/30 Admit to [facility] with sr cr 4.63.  Initial therapeutic interventions included: Gentle hydration, holding losartan and torsemide, and obtaining nephrology consultation.  10/1 Acute on chronic renal failure was the result of decompensated congestive heart failure, escalated diuresis and ARB therapy.  IV therapy continued.   10/2 Weight was up 1.67 kg serum creatinine had risen from 4.88-5.49; K 5. 3,  low FENa consistent with cardiorenal syndrome or acute GN.  She was placed on dopamine infusion, antihypertensives held, transfer process to [facility] initiated for CRRT need 10/3 Tx to [facility]. Weaned off dopamine, supplemental O2. iHd cath placed and started on CRRT.  10/4 continued CRRT  10/5 No acute complaints.  UOP 310 in last 24 hours, net neg 4.8L in last 24 hours. Afebrile  Consults:  Renal 10/1  Cardiology 10/2  Procedures:  R HD Cath 10/3 >>   Significant Diagnostic Tests:  Renal ultrasound 10/1 >> suggested chronic medical renal disease, however they were unable to evaluate the left kidney Echo 41/2 >>  LV systolic function is normal and actually hyperdynamic, RV systolic function is normal. She has grade II diastolic dysfunction with elevated LA pressure and severe biatrial enlargement, no significant valvuler abnormalities.  Her low bp's are not cardiac related. With normal RV and LV function   GN eval 10/1:  U. Creatinine >> 203 Urine immunofixation >> Anti-DNA antibody >> negative Antistreptolysin O titer >> negative  Glomerular basement membrane antibodies >> negative Total complement >> 45 C4 >> 19 C3 >> 104 MPO/PR-3 (ANCA) >> negative ANA,IFA (w/ reflex) >> UPEP/UIFE/Light chains/TP 24hr urine >>  24 hr protein >> 5,602 (H) Protein electrophoresis  >> Kappa/lambda light chain ratio >> 0.07 Kappa free light chain >> 74.6 (H) Lambda free light chain >> 1062 (H)   10/3 CT abd/ pelvis >>  No acute findings in the abdomen/pelvis.  Moderate size right effusion and small left effusion with associated bibasilar atelectasis.  Mild nodular contour to the liver which may be due to cirrhosis.  Aortic Atherosclerosis. Atherosclerotic coronary artery disease.  Micro Data:  COVID 10/1 >> neg   Antimicrobials:   Interim history/subjective:   UOP 465 ml/ 24 hrs, -1.6 ml/ net -11 L CRRT clotted x 2 overnight after heparin stopped, unable to return blood No acute complaints, previous abd pain but had BM and pain resolved.  Patient states she feels much better Continues normotensive on midodrine  Objective   Blood pressure (!) 118/52, pulse 64, temperature 98.5 F (36.9 C), temperature source Oral, resp. rate 14, height 5\' 8"  (1.727 m), weight 108.8 kg, SpO2 96 %.        Intake/Output Summary (Last 24 hours) at 09/27/2019 0732 Last data filed at 09/27/2019 0700 Gross per 24 hour  Intake 1543.98 ml  Output 4619 ml  Net -3075.02 ml   Filed Weights   09/25/19 0500  09/26/19 0500 09/27/19 0500  Weight: 116.1 kg 109.1 kg 108.8 kg   Examination: General:  Very pleasant elderly female sitting upright in bed eating breakfast in NAD HEENT: MM pink/moist Neuro:  Alert/ oriented, MAE CV: rr, no murmur PULM:  Room air, non labored, faint left basilar rales otherwise clear GI:  Obese, soft, NT/ ND, BS+, foley  CYU Extremities: warm/dry, +2 LE Skin: no rashes   Resolved Hospital Problem list    Assessment & Plan:   Acute on chronic renal failure -initially thought acute cardiorenal syndrome vs ischemic ATN. No clear precipitating events.  Work up pending.  -FeNa (0.2%) pre-renal, renal US with medical renal disease on R, L not visible  P: Appreciate Nephrology CRRT to continue for now, if clots again, plans to transition to iHD UF remains around 150 ml/hr Renal diet Trending BMP/ UOP Continue foley for now  Acute on Chronic diastolic HF  -w/ bilateral pleural effusions and pulmonary edema P: Remains stable on room air Fluid removal as above Daily I/O, weights   Hypotension Hx hypertension  -on multiple antihypertensives at home.  Suspect this is due to toxicity from accumulation d/t renal failure. -EF and LV fxn suggesting not cardiogenic in nature P: Continue midodrine for now, wean as BP tolerates  Holding home antihypertensives Goal MAP remains > 65  Acute respiratory failure 2/2 Pulmonary edema and Bilateral Pleural Effusions  Hx OSA on home CPAP P: Continue CRRT for volume removal Stable resp on room air BIPAP q HS   Hyperkalemia in setting of worsening renal failure-improved P: Resolved, monitor BMP on HD  Mild Macrocytic anemia, likely chronic due to progressive CKD -no evidence of bleeding  - not able to get blood back x 2 overnight with clotted filter  P: Expecting down trend in Hgb, trend Transfuse for Hgb < 7  Elevated LFTs.  -resolved   Hypothyroidism, TSH minimally elevated at 6.8 P: Continue synthroid  H/o seizure  P: Continue Phenytoin  Monitor phenytoin levels (corrected level 11 on 10/5)  H/o GERD P: Continue PPI   Severe Malnutrition  P: Continue prostat Add Ensure   Suspected deconditioning P:  PT consult   Best practice:  Diet: Renal diet  Pain/Anxiety/Delirium protocol (if indicated): NA VAP protocol (if indicated):  NA DVT prophylaxis: SCD GI prophylaxis: pepcid  Glucose control: NA Mobility: PT consult Code Status: full code  Family Communication: patient updated 10/6  Disposition: to remain in ICU while on CRRT, can consider tx to tele,  if transitioned to [id number]  Labs   CBC: Recent Labs  Lab 09/23/19 0425 09/24/19 0619 09/25/19 0425 09/25/19 0436 09/26/19 0700 09/27/19 0355  WBC 9.6 9.6 7.6  --  11.1* 9.9  HGB 9.1* 9.2* 10.4* 9.5* 9.2* 8.8*  HCT 28.6* 27.4* 30.9* 28.0* 28.2* 26.2*  MCV 102.9* 100.4* 100.0  --  100.4* 100.4*  PLT 175 155 133*  --  174 144*    Basic Metabolic Panel: Recent Labs  Lab 09/24/19 0619  09/25/19 0412 09/25/19 0436 09/25/19 1605 09/26/19 0420 09/26/19 1554 09/27/19 0355  NA 135   < > 135 135 135 139 136 135  K 4.8   < > 4.4 4.5 4.2 4.4 4.3 4.1  CL 103   < > 103  --  103 102 104 103  CO2 22   < > 24  --  25 25 25 26   GLUCOSE 112*   < > 100*  --  108* 84 110* 90  BUN 74*   < >  46*  --  35* 26* 21 17  CREATININE 5.10*   < > 3.31*  --  2.60* 2.24* 1.98* 1.84*  CALCIUM 7.1*   < > 6.8*  --  6.9* 6.8* 6.9* 6.5*  MG 2.1  --  2.1  --   --  2.3  --  2.0  PHOS 6.2*   < > 4.0  --  3.1 2.6 2.2* 2.8   < > = values in this interval not displayed.   GFR: Estimated Creatinine Clearance: 33.6 mL/min (A) (by C-G formula based on SCr of 1.84 mg/dL (H)). Recent Labs  Lab 09/24/19 0619 09/25/19 0425 09/26/19 0700 09/27/19 0355  WBC 9.6 7.6 11.1* 9.9    Liver Function Tests: Recent Labs  Lab 09/22/19 0121 09/22/19 0422 09/23/19 0425  09/25/19 0412 09/25/19 1605 09/26/19 0420 09/26/19 1554 09/27/19 0355  AST 76* 73* 73*  --   --   --  52*  --  61*  ALT 47* 45* 45*  --   --   --  33  --  36  ALKPHOS 145* 140* 134*  --   --   --  145*  --  149*  BILITOT 0.4 0.3 0.3  --   --   --  0.2*  --  0.3  PROT 5.3* 5.0* 4.6*  --   --   --  4.3*  --  3.9*  ALBUMIN 2.4* 2.2* 2.1*   < > 1.7* 1.6* 1.7*   1.6* 1.5* 1.4*   < > = values in this interval not displayed.    No results for input(s): LIPASE, AMYLASE in the last 168 hours. No results for input(s): AMMONIA in the last 168 hours.  ABG    Component Value Date/Time   HCO3 25.5 09/25/2019 0436   TCO2 27 09/25/2019 0436   O2SAT 59.0 09/25/2019 0436     Coagulation Profile: No results for input(s): INR, PROTIME in the last 168 hours.  Cardiac Enzymes: No results for input(s): CKTOTAL, CKMB, CKMBINDEX, TROPONINI in the last 168 hours.  HbA1C: Hgb A1c MFr Bld  Date/Time Value Ref Range Status  09/26/2019 07:00 AM 4.5 (L) 4.8 - 5.6 % Final    Comment:    (NOTE) Pre diabetes:          5.7%-6.4% Diabetes:              >6.4% Glycemic control for   <7.0% adults with diabetes     CBG: Recent Labs  Lab 09/23/19 2230 09/24/19 2008  GLUCAP 109* 107*    CRITICAL CARE Performed by: Kennieth Rad   Total critical care time: 30 minutes   Critical care time was exclusive of separately billable procedures and treating other patients.   Critical care was necessary to treat or prevent imminent or life-threatening deterioration.   Critical care was time spent personally by me on the following activities: development of treatment plan with patient and/or surrogate as well as nursing, discussions with consultants, evaluation of patient's response to treatment, examination of patient, obtaining history from patient or surrogate, ordering and performing treatments and interventions, ordering and review of laboratory studies, ordering and review of radiographic studies, pulse oximetry and re-evaluation of patient's condition.  Kennieth Rad, MSN, AGACNP-BC [place] [facility] Pgr: [phone] or if no answer [phone] 09/27/2019, 7:34 AM

## 2019-09-27 NOTE — Progress Notes (Signed)
Hemo/ onc consult deferred till all pending labs resulted, pending urine immunofixation     Kennieth Rad, MSN, AGACNP-BC [place] [facility] Pgr: [phone] or if no answer [phone] 09/27/2019, 3:09 PM

## 2019-09-27 NOTE — Progress Notes (Signed)
Noted to have Rt arm pain/swelling.  Will check doppler of Rt arm.  Chesley Mires, MD [facility] Pulmonary/Critical Care 09/27/2019, 5:22 PM

## 2019-09-27 NOTE — Progress Notes (Signed)
Patient placed on home cpap unit, will continue to monitor patient.

## 2019-09-27 NOTE — Progress Notes (Signed)
Patient ID: Sophia Mcmahon, female   DOB: 03-Feb-1941, 78 y.o.   MRN: [id number]  [facility] Progress Note   Assessment/ Plan:   1. Acute kidney Injury on chronic kidney disease (baseline creatinine/renal function unclear): Acute kidney injury likely from acute cardiorenal syndrome versus ischemic ATN.  Transferred to [facility] after developing anuria/oliguria with worsening azotemia and hyperkalemia with low-dose pressor requirement; now off pressors with oral midodrine supplementation.  Note anti-PR3 and anti-MPO neg. Complement normal. GBM neg.  Hep B and hep C neg. - Continue CRRT for now.  Asked nursing to page me if CRRT clots - nonoliguric  - Plan to transition to intermittent HD  - Hope that this will likely be short-term dialysis requirement    2.  Acute exacerbation of congestive heart failure: Started on CRRT for volume unloading.  Admitted with volume overload/refractory to outpatient diuretic therapy.  Not on pressors at this time but on midodrine.   3.  Hyperkalemia: Corrected with CRRT  4.  Anemia - acute : Likely from chronic illness including chronic kidney disease and dilution and volume excess.  Status post IV iron and continue ESA.  CBC daily   5. Proteinuria  - no concurrent hematuria.  Note GN work-up as above  - note low free light chain ratio - add on hemoglobin A1c - ANA negative - SPEP no M spike and UPEP with M spike    5. Abnormal free light chain ratio - concern for plasma cell dyscrasia - note free light chain ratio low at 0.07 - Urine with m spike  - Will need hem/onc consult here vs outpatient   Subjective:   She had 465 mL UOP over 10/5.  CRRT clotted twice after heparin was stopped.  Had 4.7 liters UF over 10/5 with CRRT.  No blood per rectum.  She has been comfortable on room air and was on CPAP overnight per hom regimen.  BP 110/55 on my exam.  Review of systems:   Denies shortness of breath; breathing is much better. No chest pain  Denies  nausea or vomiting  Urinating via foley  Has remained in the bed     Objective:   BP (!) 118/52   Pulse 64   Temp 98.5 F (36.9 C) (Oral)   Resp 14   Ht 5\' 8"  (1.727 m)   Wt 108.8 kg   SpO2 96%   BMI 36.47 kg/m   Intake/Output Summary (Last 24 hours) at 09/27/2019 0703 Last data filed at 09/27/2019 0600 Gross per 24 hour  Intake 1543.98 ml  Output 4524 ml  Net -2980.02 ml   Weight change: -0.3 kg  Physical Exam: Gen: Resting comfortably in bed, on CRRT  CVS: Pulse regular, S1 and S2 normal Resp: Anteriorly clear to auscultation, no distinct rales or rhonchi Abd: Soft, obese, nontender Ext: 1-2+ edema extremities bilaterally  Neuro - alert and oriented x 3 follows commands Psych - normal mood and affect  GU - foley in place Access: RIJ nontunneled dialysis catheter  Imaging: No results found.  Labs: BMET Recent Labs  Lab 09/24/19 0619 09/24/19 1612 09/25/19 0412 09/25/19 0436 09/25/19 1605 09/26/19 0420 09/26/19 1554 09/27/19 0355  NA 135 134* 135 135 135 139 136 135  K 4.8 4.6 4.4 4.5 4.2 4.4 4.3 4.1  CL 103 102 103  --  103 102 104 103  CO2 22 21* 24  --  25 25 25 26   GLUCOSE 112* 127* 100*  --  108* 84 110*  90  BUN 74* 60* 46*  --  35* 26* 21 17  CREATININE 5.10* 4.19* 3.31*  --  2.60* 2.24* 1.98* 1.84*  CALCIUM 7.1* 7.0* 6.8*  --  6.9* 6.8* 6.9* 6.5*  PHOS 6.2* 5.2* 4.0  --  3.1 2.6 2.2* 2.8   CBC Recent Labs  Lab 09/24/19 0619 09/25/19 0425 09/25/19 0436 09/26/19 0700 09/27/19 0355  WBC 9.6 7.6  --  11.1* 9.9  HGB 9.2* 10.4* 9.5* 9.2* 8.8*  HCT 27.4* 30.9* 28.0* 28.2* 26.2*  MCV 100.4* 100.0  --  100.4* 100.4*  PLT 155 133*  --  174 144*    Medications:    . Chlorhexidine Gluconate Cloth  6 each Topical Daily  . darbepoetin (ARANESP) injection - NON-DIALYSIS  60 mcg Subcutaneous Q Sun-1800  . docusate sodium  100 mg Oral Daily  . feeding supplement (ENSURE ENLIVE)  237 mL Oral BID BM  . feeding supplement (PRO-STAT SUGAR FREE 64)   30 mL Oral BID  . heparin  5,000 Units Subcutaneous Q8H  . levothyroxine  25 mcg Oral Q0600  . midodrine  5 mg Oral TID WC  . pantoprazole  40 mg Oral Daily  . phenytoin  300 mg Oral QHS  . sodium chloride flush  3 mL Intravenous Q12H   Claudia Desanctis 09/27/2019, 7:03 AM

## 2019-09-27 NOTE — Progress Notes (Addendum)
CRRT filter clotted twice within 7 hours from the start of my shift. Only 30 ml of blood returned the first time but was not able to return any blood the second time.

## 2019-09-27 NOTE — Procedures (Signed)
Thoracentesis Procedure Note  Pre-operative Diagnosis: Right pleural effusion   Post-operative Diagnosis: same  Indications: Right pleural effusion  Procedure Details  Consent: Informed consent was obtained. Risks of the procedure were discussed including: infection, bleeding, pain, pneumothorax.  Under sterile conditions the patient was positioned. Chlorhexidine solution and sterile drapes were utilized. 1% plain lidocaine was used to anesthetize the 5/6 rib space. Fluid was obtained without any difficulties and minimal blood loss.  A dressing was applied to the wound and wound care instructions were provided.   Findings 1000 ml of clear yellow pleural fluid was obtained.  A sample was sent to Pathology for cytology, LDH, protein, and culture.  Serum protein and LDH ordered.   Complications:  None; patient tolerated the procedure well.          Condition: stable  Plan A follow up chest x-ray was ordered.    Sophia Rad, MSN, AGACNP-BC [place] [facility] Pgr: [phone] or if no answer [phone] 09/27/2019, 2:12 PM

## 2019-09-28 ENCOUNTER — Inpatient Hospital Stay (HOSPITAL_COMMUNITY): Payer: Medicare Other

## 2019-09-28 ENCOUNTER — Encounter (HOSPITAL_COMMUNITY): Payer: Self-pay | Admitting: Oncology

## 2019-09-28 DIAGNOSIS — R609 Edema, unspecified: Secondary | ICD-10-CM

## 2019-09-28 DIAGNOSIS — I1310 Hypertensive heart and chronic kidney disease without heart failure, with stage 1 through stage 4 chronic kidney disease, or unspecified chronic kidney disease: Secondary | ICD-10-CM

## 2019-09-28 LAB — CBC
HCT: 26.7 % — ABNORMAL LOW (ref 36.0–46.0)
Hemoglobin: 8.6 g/dL — ABNORMAL LOW (ref 12.0–15.0)
MCH: 32.8 pg (ref 26.0–34.0)
MCHC: 32.2 g/dL (ref 30.0–36.0)
MCV: 101.9 fL — ABNORMAL HIGH (ref 80.0–100.0)
Platelets: 151 10*3/uL (ref 150–400)
RBC: 2.62 MIL/uL — ABNORMAL LOW (ref 3.87–5.11)
RDW: 16 % — ABNORMAL HIGH (ref 11.5–15.5)
WBC: 17 10*3/uL — ABNORMAL HIGH (ref 4.0–10.5)
nRBC: 0.3 % — ABNORMAL HIGH (ref 0.0–0.2)

## 2019-09-28 LAB — RENAL FUNCTION PANEL
Albumin: 1.4 g/dL — ABNORMAL LOW (ref 3.5–5.0)
Albumin: 1.4 g/dL — ABNORMAL LOW (ref 3.5–5.0)
Anion gap: 7 (ref 5–15)
Anion gap: 7 (ref 5–15)
BUN: 22 mg/dL (ref 8–23)
BUN: 14 mg/dL (ref 8–23)
CO2: 25 mmol/L (ref 22–32)
CO2: 26 mmol/L (ref 22–32)
Calcium: 7.2 mg/dL — ABNORMAL LOW (ref 8.9–10.3)
Calcium: 7.5 mg/dL — ABNORMAL LOW (ref 8.9–10.3)
Chloride: 103 mmol/L (ref 98–111)
Chloride: 100 mmol/L (ref 98–111)
Creatinine, Ser: 2.61 mg/dL — ABNORMAL HIGH (ref 0.44–1.00)
Creatinine, Ser: 2.02 mg/dL — ABNORMAL HIGH (ref 0.44–1.00)
GFR calc Af Amer: 20 mL/min — ABNORMAL LOW (ref >60)
GFR calc Af Amer: 27 mL/min — ABNORMAL LOW (ref >60)
GFR calc non Af Amer: 17 mL/min — ABNORMAL LOW (ref >60)
GFR calc non Af Amer: 23 mL/min — ABNORMAL LOW (ref >60)
Glucose, Bld: 99 mg/dL (ref 70–99)
Glucose, Bld: 127 mg/dL — ABNORMAL HIGH (ref 70–99)
Phosphorus: 2.5 mg/dL (ref 2.5–4.6)
Phosphorus: 2.4 mg/dL — ABNORMAL LOW (ref 2.5–4.6)
Potassium: 4.4 mmol/L (ref 3.5–5.1)
Potassium: 3.9 mmol/L (ref 3.5–5.1)
Sodium: 135 mmol/L (ref 135–145)
Sodium: 133 mmol/L — ABNORMAL LOW (ref 135–145)

## 2019-09-28 LAB — MAGNESIUM: Magnesium: 2 mg/dL (ref 1.7–2.4)

## 2019-09-28 MED ORDER — LIDOCAINE-PRILOCAINE 2.5-2.5 % EX CREA: 1.0000 "application " | TOPICAL_CREAM | CUTANEOUS | Status: DC | PRN

## 2019-09-28 MED ORDER — HEPARIN SODIUM (PORCINE) 1000 UNIT/ML DIALYSIS
1000.0000 [IU] | INTRAMUSCULAR | Status: DC | PRN
  Administered 2019-09-29: 17:00:00 2600 [IU] via INTRAVENOUS_CENTRAL

## 2019-09-28 MED ORDER — ACETAMINOPHEN 325 MG PO TABS
ORAL_TABLET | ORAL | Status: AC
  Filled 2019-09-28: qty 2

## 2019-09-28 MED ORDER — SODIUM CHLORIDE 0.9 % IV SOLN: 100.0000 mL | INTRAVENOUS | Status: DC | PRN

## 2019-09-28 MED ORDER — ACETAMINOPHEN 325 MG PO TABS
650.0000 mg | ORAL_TABLET | Freq: Four times a day (QID) | ORAL | Status: DC | PRN
  Administered 2019-09-28 – 2019-10-02 (×5): 650 mg via ORAL
  Filled 2019-09-28 (×4): qty 2

## 2019-09-28 MED ORDER — CHLORHEXIDINE GLUCONATE CLOTH 2 % EX PADS
6.0000 | MEDICATED_PAD | Freq: Every day | CUTANEOUS | Status: DC
  Administered 2019-09-29 – 2019-10-03 (×3): 6 via TOPICAL

## 2019-09-28 MED ORDER — PENTAFLUOROPROP-TETRAFLUOROETH EX AERO: 1.0000 "application " | INHALATION_SPRAY | CUTANEOUS | Status: DC | PRN

## 2019-09-28 MED ORDER — LIDOCAINE HCL (PF) 1 % IJ SOLN: 5.0000 mL | INTRAMUSCULAR | Status: DC | PRN

## 2019-09-28 MED ORDER — ALTEPLASE 2 MG IJ SOLR: 2.0000 mg | Freq: Once | INTRAMUSCULAR | Status: DC | PRN

## 2019-09-28 NOTE — Progress Notes (Signed)
Patient ID: Sophia Mcmahon, female   DOB: 1941/01/08, 78 y.o.   MRN: [id number]  [facility] Progress Note   Assessment/ Plan:   1. Acute kidney Injury on chronic kidney disease (baseline creatinine/renal function unclear): Acute kidney injury likely from acute cardiorenal syndrome versus ischemic ATN.  Transferred to [facility] after developing anuria/oliguria with worsening azotemia and hyperkalemia with low-dose pressor requirement; now off pressors with oral midodrine supplementation.  Note anti-PR3 and anti-MPO neg. Complement normal. GBM neg.  Hep B and hep C neg.  Note abnormal free light chain ratio concerning for plasma cell dyscrasia  - Transition to intermittent HD today.  Plan for treatment on 10/8 as well for volume removal  - Hope that this will likely be short-term dialysis requirement    2.  Acute exacerbation of congestive heart failure: Started on CRRT for volume unloading.  Admitted with volume overload/refractory to outpatient diuretic therapy.  Not on pressors at this time but on midodrine.    3. Hypotension  - continue midodrine to augment pressure - have increased dose to 10 mg TID for now; reassess  4.  Hyperkalemia: improved with RRT   5.  Anemia - acute : Likely from chronic illness including chronic kidney disease and dilution and volume excess.  Status post IV iron and continue ESA.  CBC daily    6. Proteinuria    - no concurrent hematuria.  Note GN work-up as above  - note low free light chain ratio - add on hemoglobin A1c - ANA negative - SPEP no M spike and UPEP with M spike    7. Abnormal free light chain ratio - concern for plasma cell dyscrasia   - note free light chain ratio low at 0.07 - Urine with m spike  - Will need hem/onc consult here vs outpatient   Subjective:   CRRT clotted yesterday.  She has been on room air.  Feels well today.  She is about to be put on first HD treatment.   Review of systems:  Denies shortness of breath; breathing  is much better. No chest pain  Denies nausea or vomiting  Urinating via foley     Objective:   BP (!) 106/44   Pulse 72   Temp 99.2 F (37.3 C) (Axillary)   Resp (!) 26   Ht 5\' 8"  (1.727 m)   Wt 105.5 kg   SpO2 95%   BMI 35.36 kg/m   Intake/Output Summary (Last 24 hours) at 09/28/2019 0646 Last data filed at 09/28/2019 0300 Gross per 24 hour  Intake 1262.96 ml  Output 646 ml  Net 616.96 ml   Weight change: -3.3 kg  Physical Exam: Gen: Resting comfortably in bed   CVS: Pulse regular, S1 and S2 normal Resp: Anteriorly clear to auscultation, no distinct rales or rhonchi Abd: Soft, obese, nontender Ext: 1-2+ edema extremities bilaterally  Neuro - alert and oriented x 3 follows commands Psych - normal mood and affect  GU - foley in place Access: RIJ nontunneled dialysis catheter  Imaging: Dg Chest Port 1 View  Result Date: 09/27/2019 CLINICAL DATA:  Status post thoracentesis. EXAM: PORTABLE CHEST 1 VIEW COMPARISON:  Same day. FINDINGS: Stable cardiomegaly. Right internal jugular catheter is unchanged. No pneumothorax is noted. Right pleural effusion is significantly smaller status post thoracentesis. Stable left basilar atelectasis and effusion is noted. Bony thorax is unremarkable. IMPRESSION: Right pleural effusion is significantly smaller status post thoracentesis. No pneumothorax is noted. Electronically Signed   By: Jeneen Rinks  Murlean Caller M.D.   On: 09/27/2019 15:00   Dg Chest Port 1 View  Result Date: 09/27/2019 CLINICAL DATA:  Acute on chronic renal failure.  Pleural effusion. EXAM: PORTABLE CHEST 1 VIEW COMPARISON:  Single-view of the chest 10/0 2 scratch the single view of the chest and CT abdomen and pelvis 09/24/2019. FINDINGS: Right IJ approach dialysis catheter is unchanged. Moderately large to large right pleural effusion and moderate left pleural effusion are again seen and not notably changed. Heart size is enlarged. No edema. Atherosclerosis noted. IMPRESSION: No marked  change in moderately large to large right and small to moderate left pleural effusions with associated compressive atelectasis. Cardiomegaly without edema. Atherosclerosis. Electronically Signed   By: Inge Rise M.D.   On: 09/27/2019 09:11    Labs: BMET Recent Labs  Lab 09/25/19 0412 09/25/19 0436 09/25/19 1605 09/26/19 0420 09/26/19 1554 09/27/19 0355 09/27/19 1712 09/28/19 0416  NA 135 135 135 139 136 135 134* 135  K 4.4 4.5 4.2 4.4 4.3 4.1 4.3 4.4  CL 103  --  103 102 104 103 103 103  CO2 24  --  25 25 25 26 24 25   GLUCOSE 100*  --  108* 84 110* 90 118* 99  BUN 46*  --  35* 26* 21 17 20 22   CREATININE 3.31*  --  2.60* 2.24* 1.98* 1.84* 2.28* 2.61*  CALCIUM 6.8*  --  6.9* 6.8* 6.9* 6.5* 7.1* 7.2*  PHOS 4.0  --  3.1 2.6 2.2* 2.8 2.1* 2.5   CBC Recent Labs  Lab 09/25/19 0425 09/25/19 0436 09/26/19 0700 09/27/19 0355 09/28/19 0416  WBC 7.6  --  11.1* 9.9 17.0*  HGB 10.4* 9.5* 9.2* 8.8* 8.6*  HCT 30.9* 28.0* 28.2* 26.2* 26.7*  MCV 100.0  --  100.4* 100.4* 101.9*  PLT 133*  --  174 144* 151    Medications:    . Chlorhexidine Gluconate Cloth  6 each Topical Daily  . Chlorhexidine Gluconate Cloth  6 each Topical Q0600  . darbepoetin (ARANESP) injection - NON-DIALYSIS  60 mcg Subcutaneous Q Sun-1800  . docusate sodium  100 mg Oral Daily  . feeding supplement (ENSURE ENLIVE)  237 mL Oral BID BM  . feeding supplement (PRO-STAT SUGAR FREE 64)  30 mL Oral BID  . heparin  5,000 Units Subcutaneous Q8H  . levothyroxine  25 mcg Oral Q0600  . midodrine  10 mg Oral TID WC  . pantoprazole  40 mg Oral Daily  . phenytoin  300 mg Oral QHS  . sodium chloride flush  3 mL Intravenous Q12H   Claudia Desanctis 09/28/2019, 6:46 AM

## 2019-09-28 NOTE — Progress Notes (Signed)
Patient refused home CPAP tonight.

## 2019-09-28 NOTE — Progress Notes (Signed)
PROGRESS NOTE  Sophia Mcmahon WSF:[id number] DOB: 1941/01/09 DOA: 09/21/2019 PCP: Frances Maywood, FNP  Brief History   78 year old female admitted 9/30 to [facility] with 30lb weight gain in 6 weeks & progressive renal failure, tx to [facility] on 10/2 in the setting of progressive cardiorenal syndrome, hypotension, and worsening acute on chronic renal failure. The patient required dopamine infusion in the ICU. She had a HD catheter placed, and  she underwent CRRT until the catheter clotted off. As her FENa was low and consistent with cardiorenal syndrome or acute glomerulonephropathy. Urine immunofixation and UPEP were performed. Results are consistent with multiple myeloma.  Hematology Oncology was consulted on 09/28/2019.  Consultants  . PCCM . Nephrology . Hematology/Oncology  Procedures  . CRRT . HD . Thoracentesis  Antibiotics   Anti-infectives (From admission, onward)   None    .  Subjective  The patient is sitting up in bed. No new complaints. Family is at bedside.  Objective   Vitals:  Vitals:   09/28/19 1300 09/28/19 1400  BP: (!) 117/50 (!) 121/48  Pulse: 75 77  Resp: 15 20  Temp:    SpO2: 95% 96%   Exam:  Constitutional:  . The patient is elderly, weak, and frail. No acute distress. Respiratory:  . No increased work of breathing. . No wheezes, rales, or rhonchi . No tactile fremitus Cardiovascular:  . Regular rate and rhythm . No murmurs, ectopy, or gallups. . No lateral PMI. No thrills. Abdomen:  . Abdomen is soft, non-tender, non-distended . No hernias, masses, or organomegaly . Normoactive bowel sounds.  Musculoskeletal:  . No cyanosis, clubbing, or edema Skin:  . No rashes, lesions, ulcers . palpation of skin: no induration or nodules Neurologic:  . Grossly normal and intact . Sensation all 4 extremities intact  I have personally reviewed the following:   Today's Data  . Vitals, CMP. CBC, Fluid Chemistry  Scheduled Meds: .  Chlorhexidine Gluconate Cloth  6 each Topical Daily  . Chlorhexidine Gluconate Cloth  6 each Topical Q0600  . darbepoetin (ARANESP) injection - NON-DIALYSIS  60 mcg Subcutaneous Q Sun-1800  . docusate sodium  100 mg Oral Daily  . feeding supplement (ENSURE ENLIVE)  237 mL Oral BID BM  . feeding supplement (PRO-STAT SUGAR FREE 64)  30 mL Oral BID  . heparin  5,000 Units Subcutaneous Q8H  . levothyroxine  25 mcg Oral Q0600  . midodrine  10 mg Oral TID WC  . pantoprazole  40 mg Oral Daily  . phenytoin  300 mg Oral QHS  . sodium chloride flush  3 mL Intravenous Q12H   Continuous Infusions: . sodium chloride      Principal Problem:   Acute renal failure superimposed on chronic kidney disease (HCC) Active Problems:   Protein-calorie malnutrition, severe (HCC)   Abnormal liver function   Anemia   Anasarca   Acute on chronic diastolic CHF (congestive heart failure) (HCC)   Cardiorenal syndrome   Acute renal failure (ARF) (HCC)   Pleural effusion   LOS: 6 days   A & P  Acute on chronic renal failure: Initially thought acute cardiorenal syndrome vs ischemic ATN. No clear precipitating events.  Work up indicates that likely cause is multiple myeloma. Patient is being transitioned to HD due to clotting of catheter for CRRT. Monitor electrolytes, creatinine, and volume status. Nephrology's assistance is appreciated.  Acute on Chronic diastolic HF: With transudative bilateral  pleural effusions and pulmonary edema. I liter removed in thoracentesis  yesterday. Monitor volume status.  The patient is stable on room air. Echocardiogram performed on 09/22/2019 demonstrated EF of 74-25% and diastolic dysfunction. This study strongly suggests that the patient's volume overload is not cardiogenic in nature.  Hypotension: Blood pressures are improved with midodrine 10 mg tid. Monitor.  Light Chain Disease: Concern for multiple myeloma. Hematology/Oncology has been consulted. I appreciate their  assistance.  Hx hypertension: The patient had been on multiple antihypertensives at home.  Suspect this is due to toxicity from accumulation d/t renal failure.  Antihypertensives are being held due to the patient's hypotension since admission. Goal MAP remains > 65.  Acute respiratory failure 2/2 Pulmonary edema and Bilateral Pleural Effusions  Hx OSA on home CPAP. The patient will be transitioned to HD instead of CRRT for volume management due to clotting of the catheter. Continue BIPAP qhs.  Hyperkalemia in setting of worsening renal failure: Resolved, monitor BMP on HD.  Mild Macrocytic anemia, likely chronic due to progressive CKD: There is no evidence of bleeding. Monitor and transfuse for hemoglobin less than 7.  Elevated LFTs: Resolved   Hypothyroidism: TSH minimally elevated at 6.8. Continue Synthroid as at home. Likely euthyroid sick syndrome.  H/O seizure: Continue phenytoin and monitor levels.  H/O GERD: Continue PPI.  Severe Malnutrition: I appreciate nutrition's assistance. Continue prostat and ensure.  Deconditioning: The patient will likely require SNF or rehab at discharge.  DVT prophylaxis: Sub cutaneous heparin Code Status: Full code Family Communication: Family at bedside. Updated. Disposition Plan: tbd   Karrissa Parchment, DO [facility] Direct contact: see [email or web address]  7PM-7AM contact night coverage as above 09/28/2019, 2:49 PM  LOS: 6 days

## 2019-09-28 NOTE — Consult Note (Addendum)
[place]  [phone] [phone]   MEDICAL ONCOLOGY - INITIAL CONSULTATION  Referral MD: Dr. Karie Kirks  Reason for Referral: Elevated serum light chains  HPI: Ms. Sophia Mcmahon is a 78 year old female with a past medical history significant for hypertension, hyperlipidemia, CKD stage IV, hypothyroidism, seizure disorder.  The patient was transferred from [facility] where she was admitted on 09/21/2019.  She arrived to [facility] on 09/23/2019 in the setting of progressive cardiorenal syndrome, hypotension, and worsening acute on chronic renal failure.  Prior to admission at [facility], she had been seen by her primary care provider who noted a rising creatinine and a 30 pound weight gain over 6 weeks.  On 09/17/2019, her serum creatinine was 3.8 and it was rechecked on 09/21/2019 and was up to 4.63.  She was seen by nephrology on 09/22/2019 who felt her acute on chronic renal failure was a result of decompensated congestive heart failure, escalated diuresis, and ARB therapy.  Her renal function continued to worsen.  She was placed on dopamine infusion and transferred to [facility] for CRRT.  Work-up today includes a renal ultrasound was obtained on 09/22/2019 which suggested chronic medical renal disease.  SPEP obtained on 09/22/2019 did not show an M spike.  On 09/22/2019, kappa free light chains were elevated at 74.6 and lambda free light chains were elevated at 1062 with a kappa, lambda light chain ratio of 0.07.  Urine protein electrophoresis was obtained on 09/23/2019 which was positive for Bence-Jones protein, lambda type.  She was noted to be anemic on admission with a hemoglobin of 9.8.  Has not required any blood transfusions this admission.  LDH was elevated at 256 on 08/28/2019.  A CT of the abdomen pelvis without contrast was performed on 09/24/2019 which showed no acute findings in the abdomen and pelvis, moderate sized right effusion and small left effusion with  associated bibasilar atelectasis.  She also has a mild nodular contour of the liver which could be due to cirrhosis.  When seen today, she reports that she is feeling much better. Reports right arm pain and swelling. Doppler U/S performed earlier today negative for DVT. Denies fevers, chills, headaches, dizziness. No recent seizure activity. Maintained on Dilantin. Denies chest pain, shortness of breath, and cough. Generalized swelling improving. Denies abdominal pain, nausea, vomiting, constipation, diarrhea. No bleeding. Denies pain other than the pain in her right arm. The patient is widowed. Lives in [place], [place]. Has been independent with ADLs and IADLs. Plans to discharge to home to liver with her daughter for a period of time. Has 3 living adult children. No history of tobacco or alcohol use. Medical Oncology was asked to see the patient to make recommendations regarding her elevated light chains.    Past Medical History:  Diagnosis Date  . CHF (congestive heart failure) ([place])   . Coronary artery disease   . Dyspnea   . High cholesterol   . Hypertension   . Hypothyroidism   . Renal disorder   . Seizures ([place])   . Sleep apnea   :  History reviewed. No pertinent surgical history.:  Current Facility-Administered Medications  Medication Dose Route Frequency Provider Last Rate Last Dose  . 0.9 %  sodium chloride infusion  250 mL Intravenous PRN Barton Dubois, MD      . 0.9 %  sodium chloride infusion  100 mL Intravenous PRN Harrie Jeans C, MD      . 0.9 %  sodium chloride infusion  100 mL Intravenous PRN Claudia Desanctis, MD      . acetaminophen (TYLENOL) tablet 650 mg  650 mg Oral Q6H PRN Anders Simmonds, MD   650 mg at 09/28/19 0930  . alteplase (CATHFLO ACTIVASE) injection 2 mg  2 mg Intracatheter Once PRN Claudia Desanctis, MD      . Chlorhexidine Gluconate Cloth 2 % PADS 6 each  6 each Topical Daily Barton Dubois, MD   6 each at 09/27/19 2011  . Chlorhexidine Gluconate Cloth 2 % PADS  6 each  6 each Topical Q0600 Claudia Desanctis, MD   6 each at 09/28/19 0606  . Darbepoetin Alfa (ARANESP) injection 60 mcg  60 mcg Subcutaneous Q Sun-1800 Elmarie Shiley, MD   60 mcg at 09/25/19 1749  . docusate sodium (COLACE) capsule 100 mg  100 mg Oral Daily Ollis, Brandi L, NP   100 mg at 09/27/19 0916  . feeding supplement (ENSURE ENLIVE) (ENSURE ENLIVE) liquid 237 mL  237 mL Oral BID BM Ollis, Brandi L, NP   237 mL at 09/27/19 1409  . feeding supplement (PRO-STAT SUGAR FREE 64) liquid 30 mL  30 mL Oral BID Barton Dubois, MD   30 mL at 09/27/19 2215  . ferric gluconate (NULECIT) 125 mg in sodium chloride 0.9 % 100 mL IVPB  125 mg Intravenous Daily Elmarie Shiley, MD 110 mL/hr at 09/28/19 1108 125 mg at 09/28/19 1108  . heparin injection 1,000 Units  1,000 Units Dialysis PRN Claudia Desanctis, MD      . heparin injection 5,000 Units  5,000 Units Subcutaneous Q8H Barton Dubois, MD   5,000 Units at 09/28/19 0606  . levothyroxine (SYNTHROID) tablet 25 mcg  25 mcg Oral Q0600 Barton Dubois, MD   25 mcg at 09/28/19 0606  . lidocaine (PF) (XYLOCAINE) 1 % injection 5 mL  5 mL Intradermal PRN Claudia Desanctis, MD      . lidocaine-prilocaine (EMLA) cream 1 application  1 application Topical PRN Claudia Desanctis, MD      . midodrine (PROAMATINE) tablet 10 mg  10 mg Oral TID WC Claudia Desanctis, MD   10 mg at 09/28/19 0656  . ondansetron (ZOFRAN) injection 4 mg  4 mg Intravenous Q6H PRN Bowser, Laurel Dimmer, NP      . pantoprazole (PROTONIX) EC tablet 40 mg  40 mg Oral Daily Barton Dubois, MD   40 mg at 09/27/19 0916  . pentafluoroprop-tetrafluoroeth (GEBAUERS) aerosol 1 application  1 application Topical PRN Claudia Desanctis, MD      . phenytoin (DILANTIN) ER capsule 300 mg  300 mg Oral QHS Barton Dubois, MD   300 mg at 09/27/19 2215  . sodium chloride flush (NS) 0.9 % injection 3 mL  3 mL Intravenous Q12H Barton Dubois, MD   3 mL at 09/27/19 2216  . sodium chloride flush (NS) 0.9 % injection 3 mL  3 mL Intravenous PRN  Barton Dubois, MD         Allergies  Allergen Reactions  . Sulfa Antibiotics Hives  :  History reviewed. No pertinent family history.:  Social History   Socioeconomic History  . Marital status: Widowed    Spouse name: Not on file  . Number of children: Not on file  . Years of education: Not on file  . Highest education level: Not on file  Occupational History  . Not on file  Social Needs  . Financial resource strain: Not hard at all  . Food  insecurity    Worry: Never true    Inability: Never true  . Transportation needs    Medical: Yes    Non-medical: Yes  Tobacco Use  . Smoking status: Never Smoker  . Smokeless tobacco: Never Used  Substance and Sexual Activity  . Alcohol use: Never    Frequency: Never  . Drug use: Never  . Sexual activity: Not Currently  Lifestyle  . Physical activity    Days per week: 0 days    Minutes per session: 0 min  . Stress: Only a little  Relationships  . Social connections    Talks on phone: More than three times a week    Gets together: Twice a week    Attends religious service: 1 to 4 times per year    Active member of club or organization: No    Attends meetings of clubs or organizations: Never    Relationship status: Widowed  . Intimate partner violence    Fear of current or ex partner: No    Emotionally abused: No    Physically abused: No    Forced sexual activity: No  Other Topics Concern  . Not on file  Social History Narrative  . Not on file  :  Review of Systems: A comprehensive 14 point review of systems was negative except as noted in the HPI.  Exam: Patient Vitals for the past 24 hrs:  BP Temp Temp src Pulse Resp SpO2 Weight  09/28/19 1100 (!) 100/49 - - 72 - - -  09/28/19 1030 (!) 102/46 - - 72 - - -  09/28/19 1000 (!) 102/51 - - 71 - - -  09/28/19 0930 (!) 109/51 - - 75 - - -  09/28/19 0900 (!) 104/55 - - 74 - - -  09/28/19 0830 (!) 111/55 - - 73 - - -  09/28/19 0800 (!) 112/52 - - 74 - - -  09/28/19  0730 (!) 108/54 - - 76 - - -  09/28/19 0719 (!) 109/50 - - 77 - - -  09/28/19 0707 (!) 93/56 98.8 F (37.1 C) Oral 79 12 94 % 232 lb 5.8 oz (105.4 kg)  09/28/19 0700 (!) 110/49 - - 79 11 97 % -  09/28/19 0600 (!) 106/44 - - 72 (!) 26 95 % -  09/28/19 0500 - - - - - - 232 lb 9.4 oz (105.5 kg)  09/28/19 0400 (!) 112/48 - - 75 11 94 % -  09/28/19 0300 (!) 112/46 - - 80 16 96 % -  09/28/19 0200 (!) 106/46 - - 75 (!) 23 94 % -  09/28/19 0103 - - - (!) 0 - - -  09/28/19 0100 (!) 102/45 - - 76 (!) 28 95 % -  09/28/19 0000 (!) 104/44 99.2 F (37.3 C) Axillary 75 (!) 9 95 % -  09/27/19 2300 (!) 106/44 - - 78 (!) 26 95 % -  09/27/19 2200 (!) 103/48 - - 76 (!) 29 96 % -  09/27/19 2100 (!) 103/46 - - 78 (!) 25 95 % -  09/27/19 2000 (!) 104/46 - - 80 (!) 27 94 % -  09/27/19 1929 - 98.6 F (37 C) Oral 80 (!) 27 95 % -  09/27/19 1800 (!) 97/47 - - 84 (!) 28 95 % -  09/27/19 1700 117/66 - - 80 (!) 25 94 % -  09/27/19 1600 (!) 118/54 - - 76 19 95 % -  09/27/19 1500 (!) 108/46 - - 75  17 97 % -  09/27/19 1400 (!) 85/57 99 F (37.2 C) - 83 (!) 25 97 % -  09/27/19 1300 (!) 109/47 - - 80 19 95 % -  09/27/19 1224 - 98.1 F (36.7 C) Oral - - - -  09/27/19 1200 (!) 108/51 - - 81 (!) 25 95 % -    General:  well-nourished in no acute distress.   Eyes:  no scleral icterus.   ENT:  There were no oropharyngeal lesions.   Neck was without thyromegaly.   Lymphatics:  Negative cervical, supraclavicular or axillary adenopathy.   Respiratory: lungs were clear bilaterally without wheezing or crackles.   Cardiovascular:  Regular rate and rhythm, S1/S2, without murmur, rub or gallop.  2+ edema RUE with redness and tenderness. 1+ edema in RUE and BLE.    GI:  abdomen was soft, flat, nontender, nondistended, without organomegaly. Musculoskeletal:  no spinal tenderness of palpation of vertebral spine.   Skin exam was without echymosis, petichae.   Neuro exam was nonfocal. Patient was alert and oriented.  Attention  was good.   Language was appropriate.  Mood was normal without depression.  Speech was not pressured.  Thought content was not tangential.     Lab Results  Component Value Date   WBC 17.0 (H) 09/28/2019   HGB 8.6 (L) 09/28/2019   HCT 26.7 (L) 09/28/2019   PLT 151 09/28/2019   GLUCOSE 99 09/28/2019   ALT 36 09/27/2019   AST 61 (H) 09/27/2019   NA 135 09/28/2019   K 4.4 09/28/2019   CL 103 09/28/2019   CREATININE 2.61 (H) 09/28/2019   BUN 22 09/28/2019   CO2 25 09/28/2019    Ct Abdomen Pelvis Wo Contrast  Result Date: 09/24/2019 CLINICAL DATA:  Acute renal failure. Unable to visualize left kidney on ultrasound. EXAM: CT ABDOMEN AND PELVIS WITHOUT CONTRAST TECHNIQUE: Multidetector CT imaging of the abdomen and pelvis was performed following the standard protocol without IV contrast. COMPARISON:  None. FINDINGS: Artifact over the posterior field of view over the lower thorax and upper abdomen possibly due to patient's arms. Lower chest: Moderate size right effusion and small left effusion with associated bibasilar atelectasis. Calcified plaque over the left anterior descending and right coronary arteries. Calcified plaque over the distal descending thoracic aorta. Generalized subcutaneous edema over the thorax. Hepatobiliary: Mild nodular contour to the liver. No focal liver mass. Gallbladder and biliary tree are normal. Pancreas: Normal. Spleen: Normal. Adrenals/Urinary Tract: Adrenal glands are normal. Kidneys are normal in size without hydronephrosis. No definite nephrolithiasis. Foley catheter present within a decompressed bladder. Stomach/Bowel: Stomach and small bowel are normal. Colon is within normal. Appendix is not well visualized. Vascular/Lymphatic: Mild-to-moderate calcified plaque over the abdominal aorta. No definite adenopathy. Reproductive: Suggestion previous hysterectomy. Other: No significant free fluid or focal inflammatory change. Diffuse subcutaneous edema. Musculoskeletal:  Degenerative change of the spine and hips. IMPRESSION: No acute findings in the abdomen/pelvis. Moderate size right effusion and small left effusion with associated bibasilar atelectasis. Mild nodular contour to the liver which may be due to cirrhosis. Aortic Atherosclerosis (ICD10-I70.0). Atherosclerotic coronary artery disease. Electronically Signed   By: Marin Olp M.D.   On: 09/24/2019 12:27   US Abdomen Complete  Result Date: 09/22/2019 CLINICAL DATA:  Acute renal failure EXAM: ABDOMEN ULTRASOUND COMPLETE COMPARISON:  None. FINDINGS: Gallbladder: No gallstones or wall thickening visualized. No sonographic Murphy sign noted by sonographer. Common bile duct: Diameter: Not visualized Liver: No focal lesion identified. Within normal limits in  parenchymal echogenicity. Portal vein is patent on color Doppler imaging with normal direction of blood flow towards the liver. IVC: No abnormality visualized. Pancreas: Visualized portion unremarkable. Spleen: Not visualized Right Kidney: Length: 10.5 cm, difficult to visualize. Increased echotexture diffusely. No hydronephrosis seen. Left Kidney: Length: Not visualized. Abdominal aorta: No aneurysm visualized. Other findings: Moderate right pleural effusion noted. Small amount of ascites seen. IMPRESSION: Limited study due to bowel gas and body habitus. Right kidney visualized and appears echogenic suggesting chronic medical renal disease. Left kidney not visualized. Small amount of ascites.  Right pleural effusion. Electronically Signed   By: Rolm Baptise M.D.   On: 09/22/2019 11:17   Dg Chest Port 1 View  Result Date: 09/27/2019 CLINICAL DATA:  Status post thoracentesis. EXAM: PORTABLE CHEST 1 VIEW COMPARISON:  Same day. FINDINGS: Stable cardiomegaly. Right internal jugular catheter is unchanged. No pneumothorax is noted. Right pleural effusion is significantly smaller status post thoracentesis. Stable left basilar atelectasis and effusion is noted. Bony thorax  is unremarkable. IMPRESSION: Right pleural effusion is significantly smaller status post thoracentesis. No pneumothorax is noted. Electronically Signed   By: Marijo Conception M.D.   On: 09/27/2019 15:00   Dg Chest Port 1 View  Result Date: 09/27/2019 CLINICAL DATA:  Acute on chronic renal failure.  Pleural effusion. EXAM: PORTABLE CHEST 1 VIEW COMPARISON:  Single-view of the chest 10/0 2 scratch the single view of the chest and CT abdomen and pelvis 09/24/2019. FINDINGS: Right IJ approach dialysis catheter is unchanged. Moderately large to large right pleural effusion and moderate left pleural effusion are again seen and not notably changed. Heart size is enlarged. No edema. Atherosclerosis noted. IMPRESSION: No marked change in moderately large to large right and small to moderate left pleural effusions with associated compressive atelectasis. Cardiomegaly without edema. Atherosclerosis. Electronically Signed   By: Inge Rise M.D.   On: 09/27/2019 09:11   Dg Chest Port 1 View  Result Date: 09/24/2019 CLINICAL DATA:  Central line placement EXAM: PORTABLE CHEST 1 VIEW COMPARISON:  09/22/2019 FINDINGS: Interval placement of right-sided central venous catheter, with tip overlying the SVC. No right pneumothorax. Enlarged cardiomediastinal silhouette with vascular congestion. Asymmetric opacity in the right thorax is likely layering effusion. Left pleural effusion remains. Continued dense consolidation at both bases. Possible calcified loose body at the right shoulder. IMPRESSION: 1. Right-sided central venous catheter tip over the SVC. No pneumothorax 2. Cardiomegaly with vascular congestion 3. Bilateral pleural effusions, likely layering on the right side. Persistent dense airspace disease at both lung bases. Electronically Signed   By: Donavan Foil M.D.   On: 09/24/2019 02:00   Dg Chest Port 1 View  Result Date: 09/22/2019 CLINICAL DATA:  Shortness of breath. History of CHF, stopped diuretics. 30  pound weight gain. EXAM: PORTABLE CHEST 1 VIEW COMPARISON:  None. FINDINGS: Cardiomegaly. There is moderate pulmonary edema. Hazy lung base opacities likely combination of pleural effusions and compressive atelectasis. No pneumothorax or confluent airspace disease in the aerated lungs. Advanced degenerative change of both shoulders. IMPRESSION: CHF. Cardiomegaly with moderate pleural effusions and pulmonary edema. Electronically Signed   By: Keith Rake M.D.   On: 09/22/2019 00:40     Ct Abdomen Pelvis Wo Contrast  Result Date: 09/24/2019 CLINICAL DATA:  Acute renal failure. Unable to visualize left kidney on ultrasound. EXAM: CT ABDOMEN AND PELVIS WITHOUT CONTRAST TECHNIQUE: Multidetector CT imaging of the abdomen and pelvis was performed following the standard protocol without IV contrast. COMPARISON:  None. FINDINGS: Artifact over  the posterior field of view over the lower thorax and upper abdomen possibly due to patient's arms. Lower chest: Moderate size right effusion and small left effusion with associated bibasilar atelectasis. Calcified plaque over the left anterior descending and right coronary arteries. Calcified plaque over the distal descending thoracic aorta. Generalized subcutaneous edema over the thorax. Hepatobiliary: Mild nodular contour to the liver. No focal liver mass. Gallbladder and biliary tree are normal. Pancreas: Normal. Spleen: Normal. Adrenals/Urinary Tract: Adrenal glands are normal. Kidneys are normal in size without hydronephrosis. No definite nephrolithiasis. Foley catheter present within a decompressed bladder. Stomach/Bowel: Stomach and small bowel are normal. Colon is within normal. Appendix is not well visualized. Vascular/Lymphatic: Mild-to-moderate calcified plaque over the abdominal aorta. No definite adenopathy. Reproductive: Suggestion previous hysterectomy. Other: No significant free fluid or focal inflammatory change. Diffuse subcutaneous edema. Musculoskeletal:  Degenerative change of the spine and hips. IMPRESSION: No acute findings in the abdomen/pelvis. Moderate size right effusion and small left effusion with associated bibasilar atelectasis. Mild nodular contour to the liver which may be due to cirrhosis. Aortic Atherosclerosis (ICD10-I70.0). Atherosclerotic coronary artery disease. Electronically Signed   By: Marin Olp M.D.   On: 09/24/2019 12:27   US Abdomen Complete  Result Date: 09/22/2019 CLINICAL DATA:  Acute renal failure EXAM: ABDOMEN ULTRASOUND COMPLETE COMPARISON:  None. FINDINGS: Gallbladder: No gallstones or wall thickening visualized. No sonographic Murphy sign noted by sonographer. Common bile duct: Diameter: Not visualized Liver: No focal lesion identified. Within normal limits in parenchymal echogenicity. Portal vein is patent on color Doppler imaging with normal direction of blood flow towards the liver. IVC: No abnormality visualized. Pancreas: Visualized portion unremarkable. Spleen: Not visualized Right Kidney: Length: 10.5 cm, difficult to visualize. Increased echotexture diffusely. No hydronephrosis seen. Left Kidney: Length: Not visualized. Abdominal aorta: No aneurysm visualized. Other findings: Moderate right pleural effusion noted. Small amount of ascites seen. IMPRESSION: Limited study due to bowel gas and body habitus. Right kidney visualized and appears echogenic suggesting chronic medical renal disease. Left kidney not visualized. Small amount of ascites.  Right pleural effusion. Electronically Signed   By: Rolm Baptise M.D.   On: 09/22/2019 11:17   Dg Chest Port 1 View  Result Date: 09/27/2019 CLINICAL DATA:  Status post thoracentesis. EXAM: PORTABLE CHEST 1 VIEW COMPARISON:  Same day. FINDINGS: Stable cardiomegaly. Right internal jugular catheter is unchanged. No pneumothorax is noted. Right pleural effusion is significantly smaller status post thoracentesis. Stable left basilar atelectasis and effusion is noted. Bony thorax  is unremarkable. IMPRESSION: Right pleural effusion is significantly smaller status post thoracentesis. No pneumothorax is noted. Electronically Signed   By: Marijo Conception M.D.   On: 09/27/2019 15:00   Dg Chest Port 1 View  Result Date: 09/27/2019 CLINICAL DATA:  Acute on chronic renal failure.  Pleural effusion. EXAM: PORTABLE CHEST 1 VIEW COMPARISON:  Single-view of the chest 10/0 2 scratch the single view of the chest and CT abdomen and pelvis 09/24/2019. FINDINGS: Right IJ approach dialysis catheter is unchanged. Moderately large to large right pleural effusion and moderate left pleural effusion are again seen and not notably changed. Heart size is enlarged. No edema. Atherosclerosis noted. IMPRESSION: No marked change in moderately large to large right and small to moderate left pleural effusions with associated compressive atelectasis. Cardiomegaly without edema. Atherosclerosis. Electronically Signed   By: Inge Rise M.D.   On: 09/27/2019 09:11   Dg Chest Port 1 View  Result Date: 09/24/2019 CLINICAL DATA:  Central line placement EXAM: PORTABLE  CHEST 1 VIEW COMPARISON:  09/22/2019 FINDINGS: Interval placement of right-sided central venous catheter, with tip overlying the SVC. No right pneumothorax. Enlarged cardiomediastinal silhouette with vascular congestion. Asymmetric opacity in the right thorax is likely layering effusion. Left pleural effusion remains. Continued dense consolidation at both bases. Possible calcified loose body at the right shoulder. IMPRESSION: 1. Right-sided central venous catheter tip over the SVC. No pneumothorax 2. Cardiomegaly with vascular congestion 3. Bilateral pleural effusions, likely layering on the right side. Persistent dense airspace disease at both lung bases. Electronically Signed   By: Donavan Foil M.D.   On: 09/24/2019 02:00   Dg Chest Port 1 View  Result Date: 09/22/2019 CLINICAL DATA:  Shortness of breath. History of CHF, stopped diuretics. 30  pound weight gain. EXAM: PORTABLE CHEST 1 VIEW COMPARISON:  None. FINDINGS: Cardiomegaly. There is moderate pulmonary edema. Hazy lung base opacities likely combination of pleural effusions and compressive atelectasis. No pneumothorax or confluent airspace disease in the aerated lungs. Advanced degenerative change of both shoulders. IMPRESSION: CHF. Cardiomegaly with moderate pleural effusions and pulmonary edema. Electronically Signed   By: Keith Rake M.D.   On: 09/22/2019 00:40   Assessment and Plan:  This is a 78 year old female with:  1.  Elevated light chains: Discussed with patient and daughter that findings are concerning for multiple myeloma. However, we need additional testing to confirm the diagnosis. Recommend bone survey and bone marrow biopsy by IR. Patient agreeable to bone survey which has been ordered today. Patient wishes to think about the bone marrow biopsy and will let us know if she wants to proceed. The patient would like to follow up at the [facility] if malignancy confirmed.   2.  Anemia: multifactorial. Receiving Aranesp and iron by Nephrology. Monitor CBC closely.  3.  Acute kidney injury on chronic kidney disease: Currently on dialysis. Ongoing management per Nephrology.   4.  Acute on chronic diastolic heart failure: Remains stable on room air. Ongoing management per hospitalist.   5. Seizure disorder: On Dilantin with no recent seizures.   Thank you for this referral.   Sophia Bussing, Sophia Mcmahon, Sophia Mcmahon, Sophia Mcmahon   Oncology addendum:  Please see the note above by Sophia Mcmahon , Sophia Mcmahon for full details.  Patient seen and examined and I agree with her recommendation and plan.  This is a 78 year old woman hospitalized with renal failure and found to have elevated free light chain indicating possible plasma cell disorder.  Free light chain lambda was elevated at 1062 with a depressed ratio at 0.07.  She has experienced and renal failure and required  hemodialysis.  He was also noted to have hypotension and required pressors for temporary period of time.   Clinically, she reports she is more awake and alert and eating better at this time.  She still have some fluid retention bloating upper and lower extremity edema.  He does not have any history of renal failure per her or her daughter's report.   On physical examination today, she appeared in no distress with generalized anasarca noted.  Her vitals appear to be overall within normal range slightly tachycardic.     Assessment and recommendations:  78 year old woman with renal failure in the setting of elevated lambda free light chain suspicious for plasma cell disorder.  The differential diagnosis was reviewed today with the patient and her daughter.  Multiple myeloma is certainly a possibility given her elevated light chain and renal failure.  Reactive findings are considered less likely at  this time.  From a diagnostic standpoint, I recommended obtaining a skeletal survey as well as a bone marrow biopsy to fully complete the work-up.  If her bone marrow biopsy does confirm light chain multiple myeloma, treatment options will be reviewed at the time.  These treatments would include Velcade based chemotherapy with dexamethasone as initial treatment.  The logistics of administration of treatment was reviewed today and given her physical location at this time she would prefer a follow-up at [facility] which we will arrange for upon confirming the diagnosis and her discharge.  The logistics of bone marrow biopsy procedure was discussed.  This will be done under IR guidance to minimize complications.  Bleeding, infection are all considered possibility but less likely.  She will discuss this further with her family and decide on proceeding with bone marrow biopsy in the near future.  All her questions were answered to their satisfaction.  Once the results of the bone marrow biopsy is  available we will discuss further with the patient and her family and will arrange follow-up at [facility].   Sophia Button MD 09/28/2019

## 2019-09-28 NOTE — Progress Notes (Signed)
Right upper extremity venous duplex completed. Refer to "CV Proc" under chart review to view preliminary results.  09/28/2019 1:50 PM Maudry Mayhew, MHA, RVT, RDCS, RDMS

## 2019-09-28 NOTE — Progress Notes (Signed)
PT Cancellation Note  Patient Details Name: Veanna Dower MRN: [id number] DOB: [birth year]   Cancelled Treatment:    Reason Eval/Treat Not Completed: Patient at procedure or test/unavailable. Pt in HD. PT to re-attempt as time allows.   Lorriane Shire 09/28/2019, 8:35 AM   Lorrin Goodell, PT  Office # [phone] Pager [phone]

## 2019-09-29 ENCOUNTER — Inpatient Hospital Stay (HOSPITAL_COMMUNITY): Payer: Medicare Other

## 2019-09-29 ENCOUNTER — Encounter (HOSPITAL_COMMUNITY): Payer: Self-pay | Admitting: Radiology

## 2019-09-29 LAB — URINALYSIS, ROUTINE W REFLEX MICROSCOPIC

## 2019-09-29 LAB — BASIC METABOLIC PANEL
Anion gap: 7 (ref 5–15)
BUN: 17 mg/dL (ref 8–23)
CO2: 27 mmol/L (ref 22–32)
Calcium: 7.4 mg/dL — ABNORMAL LOW (ref 8.9–10.3)
Chloride: 102 mmol/L (ref 98–111)
Creatinine, Ser: 2.49 mg/dL — ABNORMAL HIGH (ref 0.44–1.00)
GFR calc Af Amer: 21 mL/min — ABNORMAL LOW (ref >60)
GFR calc non Af Amer: 18 mL/min — ABNORMAL LOW (ref >60)
Glucose, Bld: 102 mg/dL — ABNORMAL HIGH (ref 70–99)
Potassium: 4.3 mmol/L (ref 3.5–5.1)
Sodium: 136 mmol/L (ref 135–145)

## 2019-09-29 LAB — CBC WITH DIFFERENTIAL/PLATELET
Abs Immature Granulocytes: 0.2 10*3/uL — ABNORMAL HIGH (ref 0.00–0.07)
Basophils Absolute: 0.1 10*3/uL (ref 0.0–0.1)
Basophils Relative: 0 %
Eosinophils Absolute: 0.2 10*3/uL (ref 0.0–0.5)
Eosinophils Relative: 1 %
HCT: 26.7 % — ABNORMAL LOW (ref 36.0–46.0)
Hemoglobin: 8.9 g/dL — ABNORMAL LOW (ref 12.0–15.0)
Immature Granulocytes: 1 %
Lymphocytes Relative: 12 %
Lymphs Abs: 3.1 10*3/uL (ref 0.7–4.0)
MCH: 34 pg (ref 26.0–34.0)
MCHC: 33.3 g/dL (ref 30.0–36.0)
MCV: 101.9 fL — ABNORMAL HIGH (ref 80.0–100.0)
Monocytes Absolute: 2.9 10*3/uL — ABNORMAL HIGH (ref 0.1–1.0)
Monocytes Relative: 11 %
Neutro Abs: 18.9 10*3/uL — ABNORMAL HIGH (ref 1.7–7.7)
Neutrophils Relative %: 75 %
Platelets: 141 10*3/uL — ABNORMAL LOW (ref 150–400)
RBC: 2.62 MIL/uL — ABNORMAL LOW (ref 3.87–5.11)
RDW: 16.1 % — ABNORMAL HIGH (ref 11.5–15.5)
WBC: 25.4 10*3/uL — ABNORMAL HIGH (ref 4.0–10.5)
nRBC: 0.2 % (ref 0.0–0.2)

## 2019-09-29 LAB — URINALYSIS, MICROSCOPIC (REFLEX): RBC / HPF: >50 RBC/hpf (ref 0–5)

## 2019-09-29 LAB — CYTOLOGY - NON PAP

## 2019-09-29 LAB — PROCALCITONIN: Procalcitonin: 0.92 ng/mL

## 2019-09-29 MED ORDER — PIPERACILLIN-TAZOBACTAM 3.375 G IVPB
3.3750 g | Freq: Two times a day (BID) | INTRAVENOUS | Status: DC
  Administered 2019-09-30 – 2019-10-01 (×3): 3.375 g via INTRAVENOUS
  Filled 2019-09-29 (×4): qty 50

## 2019-09-29 MED ORDER — PIPERACILLIN-TAZOBACTAM 3.375 G IVPB 30 MIN
3.3750 g | Freq: Once | INTRAVENOUS | Status: AC
  Administered 2019-09-29: 12:00:00 3.375 g via INTRAVENOUS
  Filled 2019-09-29: qty 50

## 2019-09-29 MED ORDER — HEPARIN SODIUM (PORCINE) 1000 UNIT/ML IJ SOLN
INTRAMUSCULAR | Status: AC
  Administered 2019-09-29: 2600 [IU] via INTRAVENOUS_CENTRAL
  Filled 2019-09-29: qty 4

## 2019-09-29 MED ORDER — VANCOMYCIN VARIABLE DOSE PER UNSTABLE RENAL FUNCTION (PHARMACIST DOSING): Status: DC

## 2019-09-29 MED ORDER — VANCOMYCIN HCL 10 G IV SOLR
2000.0000 mg | Freq: Once | INTRAVENOUS | Status: AC
  Administered 2019-09-29: 2000 mg via INTRAVENOUS
  Filled 2019-09-29: qty 2000

## 2019-09-29 MED ORDER — CHLORHEXIDINE GLUCONATE CLOTH 2 % EX PADS
6.0000 | MEDICATED_PAD | Freq: Every day | CUTANEOUS | Status: DC
  Administered 2019-10-03 – 2019-10-07 (×4): 6 via TOPICAL

## 2019-09-29 NOTE — Progress Notes (Addendum)
PROGRESS NOTE  Sophia Mcmahon IEP:[id number] DOB: [birth year] DOA: 09/21/2019 PCP: Frances Maywood, FNP  Brief History   78 year old female admitted 9/30 to [facility] with 30lb weight gain in 6 weeks & progressive renal failure, tx to [facility] on 10/2 in the setting of progressive cardiorenal syndrome, hypotension, and worsening acute on chronic renal failure. The patient required dopamine infusion in the ICU. She had a HD catheter placed, and  she underwent CRRT until the catheter clotted off. As her FENa was low and consistent with cardiorenal syndrome or acute glomerulonephropathy. Urine immunofixation and UPEP were performed. Results are consistent with multiple myeloma.  Hematology Oncology was consulted on 09/28/2019. Bone scan was performed on 09/28/2019. Family has decided in favor of Bone Marrow Biopsy. I have discussed this with the patient's daughter. She stated that she will convey this to oncology today.  Consultants  . PCCM . Nephrology . Hematology/Oncology  Procedures  . CRRT . HD . Thoracentesis  Antibiotics   Anti-infectives (From admission, onward)   Start     Dose/Rate Route Frequency Ordered Stop   09/30/19 0100  piperacillin-tazobactam (ZOSYN) IVPB 3.375 g     3.375 g 12.5 mL/hr over 240 Minutes Intravenous Every 12 hours 09/29/19 1130     09/29/19 1130  vancomycin variable dose per unstable renal function (pharmacist dosing)      Does not apply See admin instructions 09/29/19 1130     09/29/19 1130  piperacillin-tazobactam (ZOSYN) IVPB 3.375 g     3.375 g 100 mL/hr over 30 Minutes Intravenous  Once 09/29/19 1115 09/29/19 1215   09/29/19 1130  vancomycin (VANCOCIN) 2,000 mg in sodium chloride 0.9 % 500 mL IVPB     2,000 mg 250 mL/hr over 120 Minutes Intravenous  Once 09/29/19 1115       Subjective  The patient is sitting up in bed. No new complaints.  Objective   Vitals:  Vitals:   09/29/19 1100 09/29/19 1144  BP: (!) 95/46   Pulse: 79    Resp:    Temp:  98.3 F (36.8 C)  SpO2: 97%    Exam:  Constitutional:  . The patient is elderly, weak, and frail. No acute distress. Respiratory:  . No increased work of breathing. . No wheezes, rales, or rhonchi . No tactile fremitus Cardiovascular:  . Regular rate and rhythm . No murmurs, ectopy, or gallups. . No lateral PMI. No thrills. Abdomen:  . Abdomen is soft, non-tender, non-distended . No hernias, masses, or organomegaly . Normoactive bowel sounds.  Musculoskeletal:  . No cyanosis, clubbing, 3-4+ pitting edema. Skin:  . No rashes, lesions, ulcers . palpation of skin: no induration or nodules Neurologic:  . Grossly normal and intact . Sensation all 4 extremities intact  I have personally reviewed the following:   Today's Data  . Vitals, CMP. CBC, Fluid Chemistry  Scheduled Meds: . Chlorhexidine Gluconate Cloth  6 each Topical Daily  . Chlorhexidine Gluconate Cloth  6 each Topical Q0600  . darbepoetin (ARANESP) injection - NON-DIALYSIS  60 mcg Subcutaneous Q Sun-1800  . docusate sodium  100 mg Oral Daily  . feeding supplement (ENSURE ENLIVE)  237 mL Oral BID BM  . feeding supplement (PRO-STAT SUGAR FREE 64)  30 mL Oral BID  . heparin  5,000 Units Subcutaneous Q8H  . levothyroxine  25 mcg Oral Q0600  . midodrine  10 mg Oral TID WC  . pantoprazole  40 mg Oral Daily  . phenytoin  300 mg Oral QHS  .  sodium chloride flush  3 mL Intravenous Q12H  . vancomycin variable dose per unstable renal function (pharmacist dosing)   Does not apply See admin instructions   Continuous Infusions: . sodium chloride    . [START ON 09/30/2019] piperacillin-tazobactam (ZOSYN)  IV    . vancomycin 2,000 mg (09/29/19 1237)    Principal Problem:   Acute renal failure superimposed on chronic kidney disease ([place]) Active Problems:   Protein-calorie malnutrition, severe (HCC)   Abnormal liver function   Anemia   Anasarca   Acute on chronic diastolic CHF (congestive heart  failure) (HCC)   Cardiorenal syndrome   Acute renal failure (ARF) (HCC)   Pleural effusion   LOS: 7 days   A & P  Acute on chronic renal failure: Initially thought acute cardiorenal syndrome vs ischemic ATN. No clear precipitating events.  Work up indicates that likely cause is multiple myeloma. Patient is being transitioned to HD due to clotting of catheter for CRRT. Monitor electrolytes, creatinine, and volume status. Nephrology's assistance is appreciated. I have discussed the patient with Dr. Royce Macadamia. Low blood pressure are interfering with dialysis. Midodrine dose increased.  Acute on Chronic diastolic HF: With transudative bilateral  pleural effusions and pulmonary edema. I liter removed in thoracentesis yesterday. Monitor volume status.  The patient is stable on room air. Echocardiogram performed on 09/22/2019 demonstrated EF of 24-58% and diastolic dysfunction. This study strongly suggests that the patient's volume overload is not cardiogenic in nature.  Cellulitis of the right arm: Right arm was swollen yesterday and was found to be negative for DVT by ultrasound. Today it has become red and warm. Blood cultures have been obtained. IV antibiotics will be started empirically.  Hypotension: Blood pressures are improved with midodrine 10 mg tid. Monitor.  Light Chain Disease: Concern for multiple myeloma. Hematology/Oncology has been consulted. I appreciate their assistance.  Hx hypertension: The patient had been on multiple antihypertensives at home.  Suspect this is due to toxicity from accumulation d/t renal failure.  Antihypertensives are being held due to the patient's hypotension since admission. Goal MAP remains > 65.  Acute respiratory failure 2/2 Pulmonary edema and Bilateral Pleural Effusions  Hx OSA on home CPAP. The patient will be transitioned to HD instead of CRRT for volume management due to clotting of the catheter. Continue BIPAP qhs.  Hyperkalemia in setting of  worsening renal failure: Resolved, monitor BMP on HD.  Mild Macrocytic anemia, likely chronic due to progressive CKD: There is no evidence of bleeding. Monitor and transfuse for hemoglobin less than 7.  Elevated LFTs: Resolved   Hypothyroidism: TSH minimally elevated at 6.8. Continue Synthroid as at home. Likely euthyroid sick syndrome.  H/O seizure: Continue phenytoin and monitor levels.  H/O GERD: Continue PPI.  Severe Malnutrition: I appreciate nutrition's assistance. Continue prostat and ensure.  Deconditioning: The patient will likely require SNF or rehab at discharge. PT/OT ordered.  I have seen and examined this patient myself. I have spent 42 minutes in her evaluation and care.  DVT prophylaxis: Sub cutaneous heparin Code Status: Full code Family Communication: I have updated the patient's daughter, Kateri Plummer. Disposition Plan: tbd   Burel Kahre, DO [facility] Direct contact: see [email or web address]  7PM-7AM contact night coverage as above 09/29/2019, 1:41 PM  LOS: 6 days

## 2019-09-29 NOTE — Progress Notes (Signed)
Chief Complaint: Patient was seen in consultation today for bone marrow biopsy at the request of Dr. Alen Blew  Referring Physician(s): Dr. Alen Blew  Supervising Physician: Corrie Mckusick  Patient Status: [facility] - In-pt  History of Present Illness: Sophia Mcmahon is a 78 y.o. female with multiple medical problems including CHF and cardiorenal syndrome now requiring dialysis. She is also being worked up for abnormal SPE with elevated free light chains. There is concern she may have myeloma. The Heme/Onc team has been consulted and requests bone marrow biopsy. IR is asked to perform this procedure PMHx, meds, labs, imaging, allergies reviewed. Family at bedside.   Past Medical History:  Diagnosis Date   CHF (congestive heart failure) (HCC)    Coronary artery disease    Dyspnea    High cholesterol    Hypertension    Hypothyroidism    Renal disorder    Seizures ([place])    Sleep apnea     Past Surgical History:  Procedure Laterality Date   ABDOMINAL HYSTERECTOMY      Allergies: Sulfa antibiotics  Medications:  Current Facility-Administered Medications:    0.9 %  sodium chloride infusion, 250 mL, Intravenous, PRN, Barton Dubois, MD   acetaminophen (TYLENOL) tablet 650 mg, 650 mg, Oral, Q6H PRN, Anders Simmonds, MD, 650 mg at 09/28/19 1728   Chlorhexidine Gluconate Cloth 2 % PADS 6 each, 6 each, Topical, Daily, Barton Dubois, MD, 6 each at 09/27/19 2011   Chlorhexidine Gluconate Cloth 2 % PADS 6 each, 6 each, Topical, Q0600, Claudia Desanctis, MD, 6 each at 09/29/19 0330   Darbepoetin Alfa (ARANESP) injection 60 mcg, 60 mcg, Subcutaneous, Q Sun-1800, Elmarie Shiley, MD, 60 mcg at 09/25/19 1749   docusate sodium (COLACE) capsule 100 mg, 100 mg, Oral, Daily, Ollis, Brandi L, NP, 100 mg at 09/29/19 0924   feeding supplement (ENSURE ENLIVE) (ENSURE ENLIVE) liquid 237 mL, 237 mL, Oral, BID BM, Ollis, Brandi L, NP, 237 mL at 09/29/19 0924   feeding supplement (PRO-STAT  SUGAR FREE 64) liquid 30 mL, 30 mL, Oral, BID, Barton Dubois, MD, 30 mL at 09/29/19 0927   heparin injection 5,000 Units, 5,000 Units, Subcutaneous, Q8H, Barton Dubois, MD, 5,000 Units at 09/29/19 0630   levothyroxine (SYNTHROID) tablet 25 mcg, 25 mcg, Oral, Q0600, Barton Dubois, MD, 25 mcg at 09/29/19 0630   midodrine (PROAMATINE) tablet 10 mg, 10 mg, Oral, TID WC, Claudia Desanctis, MD, 10 mg at 09/29/19 1143   ondansetron (ZOFRAN) injection 4 mg, 4 mg, Intravenous, Q6H PRN, Bowser, Laurel Dimmer, NP   pantoprazole (PROTONIX) EC tablet 40 mg, 40 mg, Oral, Daily, Barton Dubois, MD, 40 mg at 09/29/19 2637   phenytoin (DILANTIN) ER capsule 300 mg, 300 mg, Oral, QHS, Barton Dubois, MD, 300 mg at 09/28/19 2255   [START ON 09/30/2019] piperacillin-tazobactam (ZOSYN) IVPB 3.375 g, 3.375 g, Intravenous, Q12H, Dang, Thuy D, RPH   sodium chloride flush (NS) 0.9 % injection 3 mL, 3 mL, Intravenous, Q12H, Barton Dubois, MD, 3 mL at 09/29/19 8588   sodium chloride flush (NS) 0.9 % injection 3 mL, 3 mL, Intravenous, PRN, Barton Dubois, MD   vancomycin variable dose per unstable renal function (pharmacist dosing), , Does not apply, See admin instructions, Mina Marble, Thuy D, [facility]    Family History  Problem Relation Age of Onset   Cancer Mother    Cancer Sister    Cancer Brother     Social History   Socioeconomic History   Marital status: Widowed  Spouse name: Not on file   Number of children: Not on file   Years of education: Not on file   Highest education level: Not on file  Occupational History   Not on file  Social Needs   Financial resource strain: Not hard at all   Food insecurity    Worry: Never true    Inability: Never true   Transportation needs    Medical: Yes    Non-medical: Yes  Tobacco Use   Smoking status: Never Smoker   Smokeless tobacco: Never Used  Substance and Sexual Activity   Alcohol use: Never    Frequency: Never   Drug use: Never   Sexual  activity: Not Currently  Lifestyle   Physical activity    Days per week: 0 days    Minutes per session: 0 min   Stress: Only a little  Relationships   Social connections    Talks on phone: More than three times a week    Gets together: Twice a week    Attends religious service: 1 to 4 times per year    Active member of club or organization: No    Attends meetings of clubs or organizations: Never    Relationship status: Widowed  Other Topics Concern   Not on file  Social History Narrative   Not on file     Review of Systems: A 12 point ROS discussed and pertinent positives are indicated in the HPI above.  All other systems are negative.  Review of Systems  Vital Signs: BP (!) 118/57 (BP Location: Right Arm)    Pulse 65    Temp 97.8 F (36.6 C) (Oral)    Resp 20    Ht 5' 8"  (1.727 m)    Wt 103.6 kg    SpO2 100%    BMI 34.73 kg/m   Physical Exam Constitutional:      Appearance: Normal appearance. She is obese.  HENT:     Mouth/Throat:     Mouth: Mucous membranes are moist.     Pharynx: Oropharynx is clear.  Cardiovascular:     Rate and Rhythm: Normal rate and regular rhythm.     Heart sounds: Normal heart sounds.  Pulmonary:     Effort: Pulmonary effort is normal. No respiratory distress.     Breath sounds: Normal breath sounds.  Skin:    General: Skin is warm and dry.  Neurological:     General: No focal deficit present.     Mental Status: She is alert and oriented to person, place, and time.  Psychiatric:        Mood and Affect: Mood normal.        Judgment: Judgment normal.      Imaging: Ct Abdomen Pelvis Wo Contrast  Result Date: 09/24/2019 CLINICAL DATA:  Acute renal failure. Unable to visualize left kidney on ultrasound. EXAM: CT ABDOMEN AND PELVIS WITHOUT CONTRAST TECHNIQUE: Multidetector CT imaging of the abdomen and pelvis was performed following the standard protocol without IV contrast. COMPARISON:  None. FINDINGS: Artifact over the posterior  field of view over the lower thorax and upper abdomen possibly due to patient's arms. Lower chest: Moderate size right effusion and small left effusion with associated bibasilar atelectasis. Calcified plaque over the left anterior descending and right coronary arteries. Calcified plaque over the distal descending thoracic aorta. Generalized subcutaneous edema over the thorax. Hepatobiliary: Mild nodular contour to the liver. No focal liver mass. Gallbladder and biliary tree are normal. Pancreas: Normal. Spleen:  Normal. Adrenals/Urinary Tract: Adrenal glands are normal. Kidneys are normal in size without hydronephrosis. No definite nephrolithiasis. Foley catheter present within a decompressed bladder. Stomach/Bowel: Stomach and small bowel are normal. Colon is within normal. Appendix is not well visualized. Vascular/Lymphatic: Mild-to-moderate calcified plaque over the abdominal aorta. No definite adenopathy. Reproductive: Suggestion previous hysterectomy. Other: No significant free fluid or focal inflammatory change. Diffuse subcutaneous edema. Musculoskeletal: Degenerative change of the spine and hips. IMPRESSION: No acute findings in the abdomen/pelvis. Moderate size right effusion and small left effusion with associated bibasilar atelectasis. Mild nodular contour to the liver which may be due to cirrhosis. Aortic Atherosclerosis (ICD10-I70.0). Atherosclerotic coronary artery disease. Electronically Signed   By: Marin Olp M.D.   On: 09/24/2019 12:27   US Abdomen Complete  Result Date: 09/22/2019 CLINICAL DATA:  Acute renal failure EXAM: ABDOMEN ULTRASOUND COMPLETE COMPARISON:  None. FINDINGS: Gallbladder: No gallstones or wall thickening visualized. No sonographic Murphy sign noted by sonographer. Common bile duct: Diameter: Not visualized Liver: No focal lesion identified. Within normal limits in parenchymal echogenicity. Portal vein is patent on color Doppler imaging with normal direction of blood flow  towards the liver. IVC: No abnormality visualized. Pancreas: Visualized portion unremarkable. Spleen: Not visualized Right Kidney: Length: 10.5 cm, difficult to visualize. Increased echotexture diffusely. No hydronephrosis seen. Left Kidney: Length: Not visualized. Abdominal aorta: No aneurysm visualized. Other findings: Moderate right pleural effusion noted. Small amount of ascites seen. IMPRESSION: Limited study due to bowel gas and body habitus. Right kidney visualized and appears echogenic suggesting chronic medical renal disease. Left kidney not visualized. Small amount of ascites.  Right pleural effusion. Electronically Signed   By: Rolm Baptise M.D.   On: 09/22/2019 11:17   Dg Chest Port 1 View  Result Date: 09/29/2019 CLINICAL DATA:  Leukocytosis.  Shortness of breath EXAM: PORTABLE CHEST 1 VIEW COMPARISON:  Two days ago FINDINGS: Haziness of the bilateral lower chest is unchanged. Cardiomegaly without edema in the apical lungs. No pneumothorax. Right IJ line with tip at the SVC. Notable advanced glenohumeral osteoarthritis with loose body on the right. IMPRESSION: Stable opacification at the bases from atelectasis/pneumonia and small pleural effusion. Electronically Signed   By: Monte Fantasia M.D.   On: 09/29/2019 10:52   Dg Chest Port 1 View  Result Date: 09/27/2019 CLINICAL DATA:  Status post thoracentesis. EXAM: PORTABLE CHEST 1 VIEW COMPARISON:  Same day. FINDINGS: Stable cardiomegaly. Right internal jugular catheter is unchanged. No pneumothorax is noted. Right pleural effusion is significantly smaller status post thoracentesis. Stable left basilar atelectasis and effusion is noted. Bony thorax is unremarkable. IMPRESSION: Right pleural effusion is significantly smaller status post thoracentesis. No pneumothorax is noted. Electronically Signed   By: Marijo Conception M.D.   On: 09/27/2019 15:00   Dg Chest Port 1 View  Result Date: 09/27/2019 CLINICAL DATA:  Acute on chronic renal failure.   Pleural effusion. EXAM: PORTABLE CHEST 1 VIEW COMPARISON:  Single-view of the chest 10/0 2 scratch the single view of the chest and CT abdomen and pelvis 09/24/2019. FINDINGS: Right IJ approach dialysis catheter is unchanged. Moderately large to large right pleural effusion and moderate left pleural effusion are again seen and not notably changed. Heart size is enlarged. No edema. Atherosclerosis noted. IMPRESSION: No marked change in moderately large to large right and small to moderate left pleural effusions with associated compressive atelectasis. Cardiomegaly without edema. Atherosclerosis. Electronically Signed   By: Inge Rise M.D.   On: 09/27/2019 09:11   Dg Chest  Port 1 View  Result Date: 09/24/2019 CLINICAL DATA:  Central line placement EXAM: PORTABLE CHEST 1 VIEW COMPARISON:  09/22/2019 FINDINGS: Interval placement of right-sided central venous catheter, with tip overlying the SVC. No right pneumothorax. Enlarged cardiomediastinal silhouette with vascular congestion. Asymmetric opacity in the right thorax is likely layering effusion. Left pleural effusion remains. Continued dense consolidation at both bases. Possible calcified loose body at the right shoulder. IMPRESSION: 1. Right-sided central venous catheter tip over the SVC. No pneumothorax 2. Cardiomegaly with vascular congestion 3. Bilateral pleural effusions, likely layering on the right side. Persistent dense airspace disease at both lung bases. Electronically Signed   By: Donavan Foil M.D.   On: 09/24/2019 02:00   Dg Chest Port 1 View  Result Date: 09/22/2019 CLINICAL DATA:  Shortness of breath. History of CHF, stopped diuretics. 30 pound weight gain. EXAM: PORTABLE CHEST 1 VIEW COMPARISON:  None. FINDINGS: Cardiomegaly. There is moderate pulmonary edema. Hazy lung base opacities likely combination of pleural effusions and compressive atelectasis. No pneumothorax or confluent airspace disease in the aerated lungs. Advanced  degenerative change of both shoulders. IMPRESSION: CHF. Cardiomegaly with moderate pleural effusions and pulmonary edema. Electronically Signed   By: Keith Rake M.D.   On: 09/22/2019 00:40   Dg Bone Survey Met  Result Date: 09/28/2019 CLINICAL DATA:  Elevated immunoglobulin free light chain EXAM: METASTATIC BONE SURVEY COMPARISON:  None. FINDINGS: There is diffuse osteopenia seen throughout. Skull: No focal lytic or blastic osseous lesion. Cervical Spine: No focal lytic or blastic osseous lesions. Multilevel degenerative changes are seen. Thoracic Spine: Multilevel degenerative changes are seen. No focal lytic or blastic lesions are seen. Anterior flowing osteophytes are noted. Chest: Mild cardiomegaly. Aortic calcifications. A right-sided central venous catheter seen with the tip at the upper SVC. A probable small left pleural effusion is seen. No lytic or blastic lesions. Lumbar Spine: Disc loss height with facet arthrosis most notable at L4-L5 and L5-S1. No lytic or blastic lesions. Scattered vascular calcifications. Pelvis: No focal lytic or blastic osseous lesion. Bilateral hip osteoarthritis. Right Upper Extremity: Glenohumeral joint and AC joint arthrosis seen with joint space loss. Periosteal reaction seen along the lateral humerus. Enthesophytes seen at the elbow. No focal lytic or blastic osseous lesion. Left Upper Extremity: Glenohumeral joint and AC joint arthrosis. These fight seen at the elbow. No focal lytic or blastic osseous lesion. Right Lower Extremity: Right hip and knee osteoarthritis seen superior joint space and osteophyte formation. No focal lytic or blastic osseous lesion. Left Lower Extremity: Left knee and hip osteoarthritis is seen. No focal lytic or blastic osseous lesion. IMPRESSION: No lytic or blastic lesions throughout. Diffuse osteopenia Electronically Signed   By: Prudencio Pair M.D.   On: 09/28/2019 20:51   Vas [place] Upper Extremity Venous Duplex  Result Date:  09/28/2019 UPPER VENOUS STUDY  Indications: Edema, and Pain Comparison Study: No prior study. Performing Technologist: Maudry Mayhew MHA, RDMS, RVT, RDCS  Examination Guidelines: A complete evaluation includes B-mode imaging, spectral Doppler, color Doppler, and power Doppler as needed of all accessible portions of each vessel. Bilateral testing is considered an integral part of a complete examination. Limited examinations for reoccurring indications may be performed as noted.  Right Findings: +----------+------------+---------+-----------+----------+-------+  RIGHT      Compressible Phasicity Spontaneous Properties Summary  +----------+------------+---------+-----------+----------+-------+  IJV                        Yes  Yes                         +----------+------------+---------+-----------+----------+-------+  Subclavian     Full        Yes        Yes                         +----------+------------+---------+-----------+----------+-------+  Axillary       Full        Yes        Yes                         +----------+------------+---------+-----------+----------+-------+  Brachial       Full        Yes        Yes                         +----------+------------+---------+-----------+----------+-------+  Radial         Full                                               +----------+------------+---------+-----------+----------+-------+  Ulnar          Full                                               +----------+------------+---------+-----------+----------+-------+  Cephalic       Full                                               +----------+------------+---------+-----------+----------+-------+  Basilic        Full                                               +----------+------------+---------+-----------+----------+-------+  Left Findings: +----------+------------+---------+-----------+----------+--------------+  LEFT       Compressible Phasicity Spontaneous Properties    Summary       +----------+------------+---------+-----------+----------+--------------+  Subclavian                                               Not visualized  +----------+------------+---------+-----------+----------+--------------+  Summary:  Right: No evidence of deep vein thrombosis in the upper extremity. No evidence of superficial vein thrombosis in the upper extremity.  *See table(s) above for measurements and observations.  Diagnosing physician: Curt Jews MD Electronically signed by Curt Jews MD on 09/28/2019 at 4:34:29 PM.    Final     Labs:  CBC: Recent Labs    09/26/19 0700 09/27/19 0355 09/28/19 0416 09/29/19 0314  WBC 11.1* 9.9 17.0* 25.4*  HGB 9.2* 8.8* 8.6* 8.9*  HCT 28.2* 26.2* 26.7* 26.7*  PLT 174 144* 151 141*    COAGS: Recent Labs    09/24/19 0619 09/25/19 0412 09/26/19 0420 09/27/19 0355  APTT 88*  151* >200* 38*    BMP: Recent Labs    09/27/19 1712 09/28/19 0416 09/28/19 1708 09/29/19 0314  NA 134* 135 133* 136  K 4.3 4.4 3.9 4.3  CL 103 103 100 102  CO2 24 25 26 27   GLUCOSE 118* 99 127* 102*  BUN 20 22 14 17   CALCIUM 7.1* 7.2* 7.5* 7.4*  CREATININE 2.28* 2.61* 2.02* 2.49*  GFRNONAA 20* 17* 23* 18*  GFRAA 23* 20* 27* 21*    LIVER FUNCTION TESTS: Recent Labs    09/22/19 0422 09/23/19 0425  09/26/19 0420  09/27/19 0355 09/27/19 1712 09/28/19 0416 09/28/19 1708  BILITOT 0.3 0.3  --  0.2*  --  0.3  --   --   --   AST 73* 73*  --  52*  --  61*  --   --   --   ALT 45* 45*  --  33  --  36  --   --   --   ALKPHOS 140* 134*  --  145*  --  149*  --   --   --   PROT 5.0* 4.6*  --  4.3*  --  3.9* 3.9*  --   --   ALBUMIN 2.2* 2.1*   < > 1.7*   1.6*   < > 1.4* 1.4* 1.4* 1.4*   < > = values in this interval not displayed.    TUMOR MARKERS: No results for input(s): AFPTM, CEA, CA199, CHROMGRNA in the last 8760 hours.  Assessment and Plan: Elevated free light chains. Poss myeloma For CT guided bone marrow biopsy tomorrow am. Labs reviewed. Discussed  with daughter and pt. Risks and benefits of bone marrow biopsy was discussed with the patient and/or patient's family including, but not limited to bleeding, infection, damage to adjacent structures or low yield requiring additional tests.  All of the questions were answered and there is agreement to proceed.  Consent signed and in chart.    Thank you for this interesting consult.  I greatly enjoyed meeting Sophia Mcmahon and look forward to participating in their care.  A copy of this report was sent to the requesting provider on this date.  Electronically Signed: Ascencion Dike, PA-C 09/29/2019, 4:41 PM   I spent a total of 20 minutes in face to face in clinical consultation, greater than 50% of which was counseling/coordinating care for bone marrow biopsy

## 2019-09-29 NOTE — Progress Notes (Addendum)
Pharmacy Antibiotic Note  Sophia Mcmahon is a 78 y.o. female admitted on 09/21/2019 with worsening heart and kidney failure.  Pharmacy has been consulted for vancomycin and Zosyn dosing for cellulitis.  Patient was on CRRT 10/2 through 09/27/19 and is now on iHD.  Renal plans dialysis today, but only for fluid removal.  Afebrile, WBC up to 25.4.    Plan: Vanc 2gm IV x 1.  No plan for vanc post HD today as it is only for ultrafiltration x 2.5 hrs. Zosyn EID 3.375gm IV Q12H Monitor HD schedule/tolerance, micro data, vanc level as indicated  Height: 5\' 8"  (172.7 cm) Weight: 227 lb 11.8 oz (103.3 kg) IBW/kg (Calculated) : 63.9  Temp (24hrs), Avg:99.1 F (37.3 C), Min:98.6 F (37 C), Max:99.4 F (37.4 C)  Recent Labs  Lab 09/25/19 0425  09/26/19 0700  09/27/19 0355 09/27/19 1712 09/28/19 0416 09/28/19 1708 09/29/19 0314  WBC 7.6  --  11.1*  --  9.9  --  17.0*  --  25.4*  CREATININE  --    < >  --    < > 1.84* 2.28* 2.61* 2.02* 2.49*   < > = values in this interval not displayed.    Estimated Creatinine Clearance: 24.2 mL/min (A) (by C-G formula based on SCr of 2.49 mg/dL (H)).    Allergies  Allergen Reactions  . Sulfa Antibiotics Hives    Vanc 10/8 >> Zosyn 10/8 >>  10/1 covid - negative 10/2 MRSA PCR - negative 10/6 right pleural fluid -  10/8 UCx -  10/8 BCx -   Ayomide Purdy D. Mina Marble, PharmD, BCPS, [place] 09/29/2019, 11:29 AM

## 2019-09-29 NOTE — Progress Notes (Addendum)
Patient ID: Sophia Mcmahon, female   DOB: July 16, 1941, 78 y.o.   MRN: [id number]  [facility] Progress Note   Assessment/ Plan:   1. Acute kidney Injury on chronic kidney disease (baseline creatinine/renal function unclear though at one time reported as CKD IV per checkout): Acute kidney injury likely from acute cardiorenal syndrome versus ischemic ATN.  Transferred to [facility] after developing anuria/oliguria with worsening azotemia and hyperkalemia with low-dose pressor requirement; now off pressors with oral midodrine supplementation.  Note anti-PR3 and anti-MPO neg. Complement normal. GBM neg.  Hep B and hep C neg.  Note abnormal free light chain ratio concerning for plasma cell dyscrasia  - Plan for 2 hour UF today.  Then anticipate HD per MWF schedule as needed  - Transition to intermittent HD today.  Plan for treatment on 10/8 as well for volume removal  - Hope that this will likely be short-term dialysis requirement    2.  Acute exacerbation of congestive heart failure: Started on renal replacement therapy for volume unloading.  Admitted with volume overload/refractory to outpatient diuretic therapy.  Not on pressors at this time but on midodrine.    3. Hypotension  - continue midodrine to augment pressure - have increased dose to 10 mg TID for now; reassess  4.  Hyperkalemia: improved with RRT   5.  Anemia - acute : Likely from chronic illness including chronic kidney disease and dilution and volume excess.  Status post IV iron and continue ESA.  CBC daily    6. Leukocytosis - Sending blood cultures - note non-tunneled catheter.  Contacted pulm to request exchange of nontunneled catheter in light of hypotension, leukocytosis - they will eval pt - spoke with primary team as well - they are working up  7. Proteinuria    - no concurrent hematuria.  Note GN work-up as above  - note low free light chain ratio - add on hemoglobin A1c - ANA negative - SPEP no M spike and UPEP  with M spike    8. Abnormal free light chain ratio - concern for plasma cell dyscrasia   - note free light chain ratio low at 0.07 - Urine with m spike  - hem/onc has been consulted   Subjective:   She had HD on 10/7 with 2.5 kg UF.  Had 390 cc UOP as well.  In a chair.  Feels well today.   Review of systems:    Denies shortness of breath; breathing is much better. No chest pain  Denies nausea or vomiting  Urinating via foley     Objective:   BP (!) 103/48   Pulse 75   Temp 99.2 F (37.3 C) (Oral)   Resp 19   Ht _0  (1.727 m)   Wt 103.3 kg   SpO2 96%   BMI 34.63 kg/m   Intake/Output Summary (Last 24 hours) at 09/29/2019 0258 Last data filed at 09/29/2019 0600 Gross per 24 hour  Intake 250 ml  Output 2890 ml  Net -2640 ml   Weight change: -0.1 kg  Physical Exam: Gen: Resting comfortably in bed  CVS: Pulse regular, S1 and S2 normal Resp: Anteriorly clear to auscultation, no distinct rales or rhonchi Abd: Soft, obese, nontender Ext: 2+ edema extremities bilaterally - improving  Neuro - alert and oriented x 3 follows commands Psych - normal mood and affect  GU - foley in place Access: RIJ nontunneled dialysis catheter  Imaging: Dg Chest Port 1 View  Result Date: 09/27/2019 CLINICAL  DATA:  Status post thoracentesis. EXAM: PORTABLE CHEST 1 VIEW COMPARISON:  Same day. FINDINGS: Stable cardiomegaly. Right internal jugular catheter is unchanged. No pneumothorax is noted. Right pleural effusion is significantly smaller status post thoracentesis. Stable left basilar atelectasis and effusion is noted. Bony thorax is unremarkable. IMPRESSION: Right pleural effusion is significantly smaller status post thoracentesis. No pneumothorax is noted. Electronically Signed   By: Marijo Conception M.D.   On: 09/27/2019 15:00   Dg Bone Survey Met  Result Date: 09/28/2019 CLINICAL DATA:  Elevated immunoglobulin free light chain EXAM: METASTATIC BONE SURVEY COMPARISON:  None. FINDINGS:  There is diffuse osteopenia seen throughout. Skull: No focal lytic or blastic osseous lesion. Cervical Spine: No focal lytic or blastic osseous lesions. Multilevel degenerative changes are seen. Thoracic Spine: Multilevel degenerative changes are seen. No focal lytic or blastic lesions are seen. Anterior flowing osteophytes are noted. Chest: Mild cardiomegaly. Aortic calcifications. A right-sided central venous catheter seen with the tip at the upper SVC. A probable small left pleural effusion is seen. No lytic or blastic lesions. Lumbar Spine: Disc loss height with facet arthrosis most notable at L4-L5 and L5-S1. No lytic or blastic lesions. Scattered vascular calcifications. Pelvis: No focal lytic or blastic osseous lesion. Bilateral hip osteoarthritis. Right Upper Extremity: Glenohumeral joint and AC joint arthrosis seen with joint space loss. Periosteal reaction seen along the lateral humerus. Enthesophytes seen at the elbow. No focal lytic or blastic osseous lesion. Left Upper Extremity: Glenohumeral joint and AC joint arthrosis. These fight seen at the elbow. No focal lytic or blastic osseous lesion. Right Lower Extremity: Right hip and knee osteoarthritis seen superior joint space and osteophyte formation. No focal lytic or blastic osseous lesion. Left Lower Extremity: Left knee and hip osteoarthritis is seen. No focal lytic or blastic osseous lesion. IMPRESSION: No lytic or blastic lesions throughout. Diffuse osteopenia Electronically Signed   By: Prudencio Pair M.D.   On: 09/28/2019 20:51   Vas [place] Upper Extremity Venous Duplex  Result Date: 09/28/2019 UPPER VENOUS STUDY  Indications: Edema, and Pain Comparison Study: No prior study. Performing Technologist: Maudry Mayhew MHA, RDMS, RVT, RDCS  Examination Guidelines: A complete evaluation includes B-mode imaging, spectral Doppler, color Doppler, and power Doppler as needed of all accessible portions of each vessel. Bilateral testing is considered an  integral part of a complete examination. Limited examinations for reoccurring indications may be performed as noted.  Right Findings: +----------+------------+---------+-----------+----------+-------+ RIGHT     CompressiblePhasicitySpontaneousPropertiesSummary +----------+------------+---------+-----------+----------+-------+ IJV                      Yes       Yes                      +----------+------------+---------+-----------+----------+-------+ Subclavian    Full       Yes       Yes                      +----------+------------+---------+-----------+----------+-------+ Axillary      Full       Yes       Yes                      +----------+------------+---------+-----------+----------+-------+ Brachial      Full       Yes       Yes                      +----------+------------+---------+-----------+----------+-------+  Radial        Full                                          +----------+------------+---------+-----------+----------+-------+ Ulnar         Full                                          +----------+------------+---------+-----------+----------+-------+ Cephalic      Full                                          +----------+------------+---------+-----------+----------+-------+ Basilic       Full                                          +----------+------------+---------+-----------+----------+-------+  Left Findings: +----------+------------+---------+-----------+----------+--------------+ LEFT      CompressiblePhasicitySpontaneousProperties   Summary     +----------+------------+---------+-----------+----------+--------------+ Subclavian                                          Not visualized +----------+------------+---------+-----------+----------+--------------+  Summary:  Right: No evidence of deep vein thrombosis in the upper extremity. No evidence of superficial vein thrombosis in the upper extremity.  *See  table(s) above for measurements and observations.  Diagnosing physician: Curt Jews MD Electronically signed by Curt Jews MD on 09/28/2019 at 4:34:29 PM.    Final     Labs: BMET Recent Labs  Lab 09/25/19 1605 09/26/19 0388 09/26/19 1554 09/27/19 0355 09/27/19 1712 09/28/19 0416 09/28/19 1708 09/29/19 0314  NA 135 139 136 135 134* 135 133* 136  K 4.2 4.4 4.3 4.1 4.3 4.4 3.9 4.3  CL 103 102 104 103 103 103 100 102  CO2 _0 GLUCOSE 108* 84 110* 90 118* 99 127* 102*  BUN 35* 26* _1 CREATININE 2.60* 2.24* 1.98* 1.84* 2.28* 2.61* 2.02* 2.49*  CALCIUM 6.9* 6.8* 6.9* 6.5* 7.1* 7.2* 7.5* 7.4*  PHOS 3.1 2.6 2.2* 2.8 2.1* 2.5 2.4*  --    CBC Recent Labs  Lab 09/26/19 0700 09/27/19 0355 09/28/19 0416 09/29/19 0314  WBC 11.1* 9.9 17.0* 25.4*  NEUTROABS  --   --   --  18.9*  HGB 9.2* 8.8* 8.6* 8.9*  HCT 28.2* 26.2* 26.7* 26.7*  MCV 100.4* 100.4* 101.9* 101.9*  PLT 174 144* 151 141*    Medications:    . Chlorhexidine Gluconate Cloth  6 each Topical Daily  . Chlorhexidine Gluconate Cloth  6 each Topical Q0600  . darbepoetin (ARANESP) injection - NON-DIALYSIS  60 mcg Subcutaneous Q Sun-1800  . docusate sodium  100 mg Oral Daily  . feeding supplement (ENSURE ENLIVE)  237 mL Oral BID BM  . feeding supplement (PRO-STAT SUGAR FREE 64)  30 mL Oral BID  . heparin  5,000 Units Subcutaneous Q8H  . levothyroxine  25 mcg Oral Q0600  . midodrine  10 mg Oral TID WC  . pantoprazole  40 mg Oral  Daily  . phenytoin  300 mg Oral QHS  . sodium chloride flush  3 mL Intravenous Q12H   Claudia Desanctis 09/29/2019, 6:47 AM

## 2019-09-29 NOTE — Progress Notes (Signed)
PT Cancellation Note  Patient Details Name: Sophia Mcmahon MRN: [id number] DOB: 02/18/1941   Cancelled Treatment:    Reason Eval/Treat Not Completed: (P) Patient at procedure or test/unavailable Pt off floor for HD. PT will follow back later as able.   Briannah Lona B. Migdalia Dk PT, DPT Acute Rehabilitation Services Pager [phone] Office [phone]   [place] 09/29/2019, 3:33 PM

## 2019-09-29 NOTE — Progress Notes (Signed)
Patient refused CPAP tonight 

## 2019-09-29 NOTE — Evaluation (Signed)
Physical Therapy Evaluation Patient Details Name: Sophia Mcmahon MRN: [id number] DOB: Jul 24, 1941 Today's Date: 09/29/2019   History of Present Illness  78 year old woman with CAD, OSA, hypertension, hyperlipidemia, obesity, HFpEF (TTE 09/22/2019 EF 65 to 70%), hypothyroidism, CKD with unknown baseline presenting with acute heart failure and acute on chronic renal failure. Placed on CRRT at [facility] 09/24/19-09/27/19. s/p thoracentesis R pleural effusion 09/27/19  Clinical Impression  PTA pt living independently, ambulating without AD and completing ADLs and iADLs. Pt is planning to stay with daughter in multistory home with 2 steps to enter where she will have 24 hour assist. Pt is limited in safe mobility by increased edema, in presence of decreased strength and endurance. Pt is currently min guard for bed mobility, and minA for transfers and ambulation of 3 feet with RW. PT recommends HHPT level rehab at discharge. PT will continue to follow acutely.     Follow Up Recommendations Home health PT;Supervision/Assistance - 24 hour    Equipment Recommendations  Rolling walker with 5" wheels    Recommendations for Other Services OT consult     Precautions / Restrictions Restrictions Weight Bearing Restrictions: No      Mobility  Bed Mobility Overal bed mobility: Needs Assistance Bed Mobility: Supine to Sit     Supine to sit: Min guard     General bed mobility comments: min guard for bed mobility, increased time and effort however pt able to complete without assist  Transfers Overall transfer level: Needs assistance Equipment used: Rolling walker (2 wheeled) Transfers: Sit to/from Stand Sit to Stand: Min assist         General transfer comment: able to power up but requires minA for steadying, vc for hand placement to powerup  Ambulation/Gait Ambulation/Gait assistance: Min assist Gait Distance (Feet): 3 Feet Assistive device: Rolling walker (2 wheeled) Gait  Pattern/deviations: Step-to pattern;Decreased step length - right;Decreased step length - left;Shuffle Gait velocity: slowed Gait velocity interpretation: <1.31 ft/sec, indicative of household ambulator General Gait Details: initially min guard for slow, shuffling steps from bed to recliner, pt with c/o lightheadness minA required for steadying with stepping to recliner        Balance Overall balance assessment: Needs assistance Sitting-balance support: Feet supported;No upper extremity supported Sitting balance-Leahy Scale: Fair     Standing balance support: Bilateral upper extremity supported Standing balance-Leahy Scale: Poor Standing balance comment: requires UE support for steadying                             Pertinent Vitals/Pain Pain Assessment: No/denies pain    Home Living Family/patient expects to be discharged to:: Private residence Living Arrangements: Children Available Help at Discharge: Family;Available 24 hours/day Type of Home: House Home Access: Stairs to enter Entrance Stairs-Rails: Can reach both Entrance Stairs-Number of Steps: 2 Home Layout: Multi-level;Able to live on main level with bedroom/bathroom Home Equipment: Kasandra Knudsen - single point;Shower seat      Prior Function Level of Independence: Independent         Comments: drives, cooks, cleans, gardens     Journalist, newspaper        Extremity/Trunk Assessment   Upper Extremity Assessment Upper Extremity Assessment: RUE deficits/detail;LUE deficits/detail RUE Deficits / Details: increased edema limiting full ROM  LUE Deficits / Details: increased edema limiting full ROM     Lower Extremity Assessment Lower Extremity Assessment: RLE deficits/detail;LLE deficits/detail RLE Deficits / Details: increased edema limiting full ROM in ankle and knee,  strength grossly 3+/5 RLE Coordination: decreased fine motor LLE Deficits / Details: increased edema limiting full ROM in ankle and knee,  strength grossly 3+/5 LLE Coordination: decreased fine motor    Cervical / Trunk Assessment Cervical / Trunk Assessment: Kyphotic  Communication   Communication: No difficulties  Cognition Arousal/Alertness: Awake/alert Behavior During Therapy: WFL for tasks assessed/performed Overall Cognitive Status: Within Functional Limits for tasks assessed                                        General Comments General comments (skin integrity, edema, etc.): Pt daughter in room, edema in all four extremities limiting full ROM, BP with movement to chair 118/53, SaO2 on RA >92%O2 throughout,         Assessment/Plan    PT Assessment Patient needs continued PT services  PT Problem List Decreased strength;Decreased range of motion;Decreased activity tolerance;Decreased balance;Decreased mobility;Decreased knowledge of use of DME       PT Treatment Interventions DME instruction;Gait training;Functional mobility training;Stair training;Therapeutic activities;Therapeutic exercise;Balance training;Cognitive remediation;Patient/family education    PT Goals (Current goals can be found in the Care Plan section)  Acute Rehab PT Goals Patient Stated Goal: get back to living independently PT Goal Formulation: With patient/family Time For Goal Achievement: 10/13/19 Potential to Achieve Goals: Good    Frequency Min 3X/week    AM-PAC PT "6 Clicks" Mobility  Outcome Measure Help needed turning from your back to your side while in a flat bed without using bedrails?: None Help needed moving from lying on your back to sitting on the side of a flat bed without using bedrails?: A Little Help needed moving to and from a bed to a chair (including a wheelchair)?: A Little Help needed standing up from a chair using your arms (e.g., wheelchair or bedside chair)?: A Little Help needed to walk in hospital room?: A Lot Help needed climbing 3-5 steps with a railing? : Total 6 Click Score: 16     End of Session Equipment Utilized During Treatment: Gait belt Activity Tolerance: Patient tolerated treatment well Patient left: in chair;with call bell/phone within reach;with family/visitor present Nurse Communication: Mobility status PT Visit Diagnosis: Unsteadiness on feet (R26.81);Other abnormalities of gait and mobility (R26.89);Muscle weakness (generalized) (M62.81);Difficulty in walking, not elsewhere classified (R26.2)    Time: 2353-6144 PT Time Calculation (min) (ACUTE ONLY): 25 min   Charges:   PT Evaluation $PT Eval Moderate Complexity: 1 Mod PT Treatments $Gait Training: 8-22 mins        Aneyah Lortz B. Migdalia Dk PT, DPT Acute Rehabilitation Services Pager [phone] Office [phone]   [place] 09/29/2019, 5:38 PM

## 2019-09-30 ENCOUNTER — Other Ambulatory Visit: Payer: Self-pay

## 2019-09-30 ENCOUNTER — Inpatient Hospital Stay (HOSPITAL_COMMUNITY)
Admit: 2019-09-30 | Discharge: 2019-09-30 | Disposition: A | Discharge status: Home / Self Care | Payer: Medicare Other | Attending: Oncology | Admitting: Oncology

## 2019-09-30 DIAGNOSIS — N184 Chronic kidney disease, stage 4 (severe): Secondary | ICD-10-CM

## 2019-09-30 DIAGNOSIS — C9000 Multiple myeloma not having achieved remission: Secondary | ICD-10-CM

## 2019-09-30 DIAGNOSIS — D72829 Elevated white blood cell count, unspecified: Secondary | ICD-10-CM

## 2019-09-30 HISTORY — PX: IR BONE MARROW BIOPSY: IMG5733

## 2019-09-30 LAB — RENAL FUNCTION PANEL
Albumin: 1.4 g/dL — ABNORMAL LOW (ref 3.5–5.0)
Anion gap: 8 (ref 5–15)
BUN: 28 mg/dL — ABNORMAL HIGH (ref 8–23)
CO2: 26 mmol/L (ref 22–32)
Calcium: 7.5 mg/dL — ABNORMAL LOW (ref 8.9–10.3)
Chloride: 101 mmol/L (ref 98–111)
Creatinine, Ser: 3.38 mg/dL — ABNORMAL HIGH (ref 0.44–1.00)
GFR calc Af Amer: 15 mL/min — ABNORMAL LOW (ref >60)
GFR calc non Af Amer: 13 mL/min — ABNORMAL LOW (ref >60)
Glucose, Bld: 110 mg/dL — ABNORMAL HIGH (ref 70–99)
Phosphorus: 3.5 mg/dL (ref 2.5–4.6)
Potassium: 4.2 mmol/L (ref 3.5–5.1)
Sodium: 135 mmol/L (ref 135–145)

## 2019-09-30 LAB — PROCALCITONIN: Procalcitonin: 0.88 ng/mL

## 2019-09-30 MED ORDER — FENTANYL CITRATE (PF) 100 MCG/2ML IJ SOLN
INTRAMUSCULAR | Status: AC
  Filled 2019-09-30: qty 2

## 2019-09-30 MED ORDER — LIDOCAINE HCL 1 % IJ SOLN
INTRAMUSCULAR | Status: AC | PRN
  Administered 2019-09-30: 10 mL

## 2019-09-30 MED ORDER — LIDOCAINE HCL 1 % IJ SOLN
INTRAMUSCULAR | Status: AC
  Filled 2019-09-30: qty 20

## 2019-09-30 MED ORDER — FUROSEMIDE 10 MG/ML IJ SOLN
80.0000 mg | Freq: Two times a day (BID) | INTRAMUSCULAR | Status: DC
  Administered 2019-09-30 – 2019-10-06 (×13): 80 mg via INTRAVENOUS
  Filled 2019-09-30 (×13): qty 8

## 2019-09-30 MED ORDER — FENTANYL CITRATE (PF) 100 MCG/2ML IJ SOLN
INTRAMUSCULAR | Status: AC | PRN
  Administered 2019-09-30: 25 ug via INTRAVENOUS
  Administered 2019-09-30: 50 ug via INTRAVENOUS

## 2019-09-30 MED ORDER — MIDAZOLAM HCL 2 MG/2ML IJ SOLN
INTRAMUSCULAR | Status: AC
  Filled 2019-09-30: qty 2

## 2019-09-30 MED ORDER — MIDAZOLAM HCL 2 MG/2ML IJ SOLN
INTRAMUSCULAR | Status: AC | PRN
  Administered 2019-09-30: 1 mg via INTRAVENOUS

## 2019-09-30 NOTE — Progress Notes (Signed)
Patient ID: Sophia Mcmahon, female   DOB: 09-Dec-1941, 78 y.o.   MRN: [id number]  [facility] Progress Note   Assessment/ Plan:   1. Acute kidney Injury on chronic kidney disease (baseline creatinine/renal function unclear though at one time reported as CKD IV- followed by nephrologist pre admit ): Acute kidney injury likely from acute cardiorenal syndrome versus ischemic ATN- was also on ARB prior to admit.  Developed anuria/oliguria with worsening azotemia and hyperkalemia.  started CRRT on 10/3 early AM - stopped on 10/6 ? - had intermittent tx on Wed, yest.  Does not seem to have acute needs for HD today - will order labs to confirm , will hold dialysis and try for some diuresis with lasix  2.  Acute exacerbation of congestive heart failure: Started on renal replacement therapy for volume unloading.  Admitted with volume overload/refractory to outpatient diuretic therapy.  Not on pressors at this time but on midodrine.  Will try to challenge with lasix since having UOP-- she tells me she stays swollen, is already down 25 pounds from admit-  Is on room air   3. Hypotension  - continue midodrine to augment pressure -  10 mg TID for now  5.  Anemia - acute : Likely from chronic illness including chronic kidney disease and dilution and volume excess.  Status post IV iron and continue ESA.  CBC daily    6. Leukocytosis Cultures neg to date- on zosyn and vanc  7. Proteinuria    - no concurrent hematuria.  GN work-up really negative  UPEP with M spike    8. Abnormal free light chain ratio - concern for plasma cell dyscrasia   - note free light chain ratio low at 0.07 - Urine with m spike  - hem/onc has been consulted - for skeletal survey and bone marrow biopsy  Subjective:   She had HD on 10/8 with 2 liter volume removal-  UOP 300 to 400 per day -  Looks good - has orders to go to floor bed- also planning on bone marrow biopsy today       Objective:   BP (!) 109/48   Pulse  75   Temp 98.8 F (37.1 C) (Oral)   Resp 19   Ht 5' 8"  (1.727 m)   Wt 104.9 kg   SpO2 98%   BMI 35.16 kg/m   Intake/Output Summary (Last 24 hours) at 09/30/2019 0731 Last data filed at 09/30/2019 0600 Gross per 24 hour  Intake 372.55 ml  Output 2350 ml  Net -1977.45 ml   Weight change: -1.7 kg  Physical Exam: Gen: Resting comfortably in bed  CVS: Pulse regular, S1 and S2 normal Resp: Anteriorly clear to auscultation, no distinct rales or rhonchi Abd: Soft, obese, nontender Ext: 2+ edema extremities bilaterally - improving  Neuro - alert and oriented x 3 follows commands Psych - normal mood and affect  GU - foley in place Access: RIJ nontunneled dialysis catheter placed 10/3  Imaging: Dg Chest Port 1 View  Result Date: 09/29/2019 CLINICAL DATA:  Leukocytosis.  Shortness of breath EXAM: PORTABLE CHEST 1 VIEW COMPARISON:  Two days ago FINDINGS: Haziness of the bilateral lower chest is unchanged. Cardiomegaly without edema in the apical lungs. No pneumothorax. Right IJ line with tip at the SVC. Notable advanced glenohumeral osteoarthritis with loose body on the right. IMPRESSION: Stable opacification at the bases from atelectasis/pneumonia and small pleural effusion. Electronically Signed   By: Monte Fantasia M.D.   On:  09/29/2019 10:52   Dg Bone Survey Met  Result Date: 09/28/2019 CLINICAL DATA:  Elevated immunoglobulin free light chain EXAM: METASTATIC BONE SURVEY COMPARISON:  None. FINDINGS: There is diffuse osteopenia seen throughout. Skull: No focal lytic or blastic osseous lesion. Cervical Spine: No focal lytic or blastic osseous lesions. Multilevel degenerative changes are seen. Thoracic Spine: Multilevel degenerative changes are seen. No focal lytic or blastic lesions are seen. Anterior flowing osteophytes are noted. Chest: Mild cardiomegaly. Aortic calcifications. A right-sided central venous catheter seen with the tip at the upper SVC. A probable small left pleural effusion  is seen. No lytic or blastic lesions. Lumbar Spine: Disc loss height with facet arthrosis most notable at L4-L5 and L5-S1. No lytic or blastic lesions. Scattered vascular calcifications. Pelvis: No focal lytic or blastic osseous lesion. Bilateral hip osteoarthritis. Right Upper Extremity: Glenohumeral joint and AC joint arthrosis seen with joint space loss. Periosteal reaction seen along the lateral humerus. Enthesophytes seen at the elbow. No focal lytic or blastic osseous lesion. Left Upper Extremity: Glenohumeral joint and AC joint arthrosis. These fight seen at the elbow. No focal lytic or blastic osseous lesion. Right Lower Extremity: Right hip and knee osteoarthritis seen superior joint space and osteophyte formation. No focal lytic or blastic osseous lesion. Left Lower Extremity: Left knee and hip osteoarthritis is seen. No focal lytic or blastic osseous lesion. IMPRESSION: No lytic or blastic lesions throughout. Diffuse osteopenia Electronically Signed   By: Prudencio Pair M.D.   On: 09/28/2019 20:51   Vas [place] Upper Extremity Venous Duplex  Result Date: 09/28/2019 UPPER VENOUS STUDY  Indications: Edema, and Pain Comparison Study: No prior study. Performing Technologist: Maudry Mayhew MHA, RDMS, RVT, RDCS  Examination Guidelines: A complete evaluation includes B-mode imaging, spectral Doppler, color Doppler, and power Doppler as needed of all accessible portions of each vessel. Bilateral testing is considered an integral part of a complete examination. Limited examinations for reoccurring indications may be performed as noted.  Right Findings: +----------+------------+---------+-----------+----------+-------+ RIGHT     CompressiblePhasicitySpontaneousPropertiesSummary +----------+------------+---------+-----------+----------+-------+ IJV                      Yes       Yes                      +----------+------------+---------+-----------+----------+-------+ Subclavian    Full       Yes        Yes                      +----------+------------+---------+-----------+----------+-------+ Axillary      Full       Yes       Yes                      +----------+------------+---------+-----------+----------+-------+ Brachial      Full       Yes       Yes                      +----------+------------+---------+-----------+----------+-------+ Radial        Full                                          +----------+------------+---------+-----------+----------+-------+ Ulnar         Full                                          +----------+------------+---------+-----------+----------+-------+  Cephalic      Full                                          +----------+------------+---------+-----------+----------+-------+ Basilic       Full                                          +----------+------------+---------+-----------+----------+-------+  Left Findings: +----------+------------+---------+-----------+----------+--------------+ LEFT      CompressiblePhasicitySpontaneousProperties   Summary     +----------+------------+---------+-----------+----------+--------------+ Subclavian                                          Not visualized +----------+------------+---------+-----------+----------+--------------+  Summary:  Right: No evidence of deep vein thrombosis in the upper extremity. No evidence of superficial vein thrombosis in the upper extremity.  *See table(s) above for measurements and observations.  Diagnosing physician: Curt Jews MD Electronically signed by Curt Jews MD on 09/28/2019 at 4:34:29 PM.    Final     Labs: BMET Recent Labs  Lab 09/25/19 1605 09/26/19 6834 09/26/19 1554 09/27/19 0355 09/27/19 1712 09/28/19 0416 09/28/19 1708 09/29/19 0314  NA 135 139 136 135 134* 135 133* 136  K 4.2 4.4 4.3 4.1 4.3 4.4 3.9 4.3  CL 103 102 104 103 103 103 100 102  CO2 25 25 25 26 24 25 26 27   GLUCOSE 108* 84 110* 90 118* 99 127* 102*   BUN 35* 26* 21 17 20 22 14 17   CREATININE 2.60* 2.24* 1.98* 1.84* 2.28* 2.61* 2.02* 2.49*  CALCIUM 6.9* 6.8* 6.9* 6.5* 7.1* 7.2* 7.5* 7.4*  PHOS 3.1 2.6 2.2* 2.8 2.1* 2.5 2.4*  --    CBC Recent Labs  Lab 09/26/19 0700 09/27/19 0355 09/28/19 0416 09/29/19 0314  WBC 11.1* 9.9 17.0* 25.4*  NEUTROABS  --   --   --  18.9*  HGB 9.2* 8.8* 8.6* 8.9*  HCT 28.2* 26.2* 26.7* 26.7*  MCV 100.4* 100.4* 101.9* 101.9*  PLT 174 144* 151 141*    Medications:    . Chlorhexidine Gluconate Cloth  6 each Topical Daily  . Chlorhexidine Gluconate Cloth  6 each Topical Q0600  . Chlorhexidine Gluconate Cloth  6 each Topical Q0600  . darbepoetin (ARANESP) injection - NON-DIALYSIS  60 mcg Subcutaneous Q Sun-1800  . docusate sodium  100 mg Oral Daily  . feeding supplement (ENSURE ENLIVE)  237 mL Oral BID BM  . feeding supplement (PRO-STAT SUGAR FREE 64)  30 mL Oral BID  . heparin  5,000 Units Subcutaneous Q8H  . levothyroxine  25 mcg Oral Q0600  . midodrine  10 mg Oral TID WC  . pantoprazole  40 mg Oral Daily  . phenytoin  300 mg Oral QHS  . sodium chloride flush  3 mL Intravenous Q12H  . vancomycin variable dose per unstable renal function (pharmacist dosing)   Does not apply See admin instructions   Louis Meckel 09/30/2019, 7:31 AM

## 2019-09-30 NOTE — Progress Notes (Signed)
Physical Therapy Treatment Patient Details Name: Sophia Mcmahon MRN: [id number] DOB: 08-07-1941 Today's Date: 09/30/2019    History of Present Illness 78 year old woman with CAD, OSA, hypertension, hyperlipidemia, obesity, HFpEF (TTE 09/22/2019 EF 65 to 70%), hypothyroidism, CKD with unknown baseline presenting with acute heart failure and acute on chronic renal failure. Placed on CRRT at [facility] 09/24/19-09/27/19. s/p thoracentesis R pleural effusion 09/27/19    PT Comments    Pt making good progress.    Follow Up Recommendations  Home health PT;Supervision/Assistance - 24 hour     Equipment Recommendations  Rolling walker with 5" wheels    Recommendations for Other Services       Precautions / Restrictions Restrictions Weight Bearing Restrictions: No    Mobility  Bed Mobility Overal bed mobility: Needs Assistance Bed Mobility: Supine to Sit     Supine to sit: Min guard     General bed mobility comments: Assist to for lines and incr time  Transfers Overall transfer level: Needs assistance Equipment used: Rolling walker (2 wheeled) Transfers: Sit to/from Stand Sit to Stand: Min assist;+2 safety/equipment         General transfer comment: Assist to stabilize  Ambulation/Gait Ambulation/Gait assistance: Min assist Gait Distance (Feet): 140 Feet Assistive device: Rolling walker (2 wheeled) Gait Pattern/deviations: Step-through pattern;Decreased stride length;Trunk flexed;Drifts right/left Gait velocity: decr Gait velocity interpretation: <1.31 ft/sec, indicative of household ambulator General Gait Details: Assist for balance and support   Stairs             Wheelchair Mobility    Modified Rankin (Stroke Patients Only)       Balance Overall balance assessment: Needs assistance Sitting-balance support: Feet supported;No upper extremity supported Sitting balance-Leahy Scale: Fair     Standing balance support: Bilateral upper extremity  supported Standing balance-Leahy Scale: Poor Standing balance comment: walker and min guard for static standing                            Cognition Arousal/Alertness: Awake/alert Behavior During Therapy: WFL for tasks assessed/performed Overall Cognitive Status: Within Functional Limits for tasks assessed                                        Exercises      General Comments General comments (skin integrity, edema, etc.): VSS      Pertinent Vitals/Pain      Home Living Family/patient expects to be discharged to:: Private residence Living Arrangements: Children Available Help at Discharge: Family;Available 24 hours/day Type of Home: House Home Access: Stairs to enter Entrance Stairs-Rails: Can reach both Home Layout: Multi-level;Able to live on main level with bedroom/bathroom Home Equipment: Kasandra Knudsen - single point;Shower seat      Prior Function Level of Independence: Independent      Comments: drives, cooks, cleans, gardens   PT Goals (current goals can now be found in the care plan section) Acute Rehab PT Goals Patient Stated Goal: get back to living independently Progress towards PT goals: Progressing toward goals    Frequency    Min 3X/week      PT Plan Current plan remains appropriate    Co-evaluation              AM-PAC PT "6 Clicks" Mobility   Outcome Measure  Help needed turning from your back to your side while in a flat bed  without using bedrails?: None Help needed moving from lying on your back to sitting on the side of a flat bed without using bedrails?: A Little Help needed moving to and from a bed to a chair (including a wheelchair)?: A Little Help needed standing up from a chair using your arms (e.g., wheelchair or bedside chair)?: A Little Help needed to walk in hospital room?: A Little Help needed climbing 3-5 steps with a railing? : Total 6 Click Score: 17    End of Session Equipment Utilized During  Treatment: Gait belt Activity Tolerance: Patient tolerated treatment well Patient left: in chair;with call bell/phone within reach Nurse Communication: Mobility status PT Visit Diagnosis: Unsteadiness on feet (R26.81);Other abnormalities of gait and mobility (R26.89);Muscle weakness (generalized) (M62.81);Difficulty in walking, not elsewhere classified (R26.2)     Time: 9021-1155 PT Time Calculation (min) (ACUTE ONLY): 19 min  Charges:  $Gait Training: 8-22 mins                     [place] Pager [phone] Office Friend 09/30/2019, 3:00 PM

## 2019-09-30 NOTE — Procedures (Signed)
Interventional Radiology Procedure Note  Procedure: Fluoro guided aspirate and core biopsy of left iliac bone Complications: None Recommendations: - Bedrest supine x 1 hrs - OTC's PRN  Pain - Follow biopsy results  Signed,  Dulcy Fanny. Earleen Newport, DO

## 2019-09-30 NOTE — Progress Notes (Addendum)
PROGRESS NOTE    Sophia Mcmahon  ANV:[id number] DOB: Mar 04, 1941 DOA: 09/21/2019 PCP: Frances Maywood, FNP    Brief Narrative:   78 year old female admitted 9/30 to [facility] with 30lb weight gain in 6 weeks & progressive renal failure, tx to [facility] on 10/2 in the setting of progressive cardiorenal syndrome, hypotension, and worsening acute on chronic renal failure. The patient required dopamine infusion in the ICU. She had a HD catheter placed, and  she underwent CRRT until the catheter clotted off. As her FENa was low and consistent with cardiorenal syndrome or acute glomerulonephropathy. Urine immunofixation and UPEP were performed. Results are consistent with multiple myeloma.  Hematology Oncology was consulted on 09/28/2019. Bone scan was performed on 09/28/2019. I have discussed this with the patient's daughter. She stated that she will convey this to oncology today.   Interim History: -BM biopsy was performed by Dr. Levan Hurst of IR 09/30/19   Assessment & Plan:   Principal Problem:   Acute renal failure superimposed on chronic kidney disease ([place]) Active Problems:   Protein-calorie malnutrition, severe ([place])   Abnormal liver function   Anemia   Anasarca   Acute on chronic diastolic CHF (congestive heart failure) (HCC)   Cardiorenal syndrome   Acute renal failure (ARF) (HCC)   Pleural effusion   Acute renal failure superimposed on chronic kidney disease -IV: Baseline creatinine/renal function unclear though at one time reported as CKD IV- followed by nephrologist pre admit ): Acute kidney injury likely from acute cardiorenal syndrome versus ischemic ATN- was also on ARB prior to admit.  Developed anuria/oliguria with worsening azotemia and hyperkalemia.  started CRRT on 10/3 early AM - stopped on 10/6. Renal was consulted.  Dr. Moshe Cipro saw patient today did not think patient need acute dialysis today.  -hold dialysis and try for some diuresis with lasix 80 mg  per Dr. Moshe Cipro -check BMP in AM.  Acute on Chronic diastolic HF: With transudative bilateral  pleural effusions and pulmonary edema. I liter removed in thoracentesis yesterday. Monitor volume status.  The patient is stable on room air. Echocardiogram performed on 09/22/2019 demonstrated EF of 59-97% and diastolic dysfunction. This study strongly suggests that the patient's volume overload is not cardiogenic in nature. -on lasix 80 mg bid  Cellulitis of the right arm: Right arm was swollen and was found to be negative for DVT by ultrasound. Today it has become red and warm.  -Blood cultures have been obtained. IV antibiotics will be started empirically.  Vancomycin and Zosyn started.  Patient does not have fever, but has leukocytosis with WBC 25.4. -Vancomycin and Zosyn started.   Hypotension: Blood pressures are improved with midodrine 10 mg tid. - Monitor. BP  Light Chain Disease: Concern for multiple myeloma. Hematology/Oncology has been consulted. Appreciate their assistance. - BM biopsy was performed by Dr. Levan Hurst of IR 09/30/19  Hx hypertension: The patient had been on multiple antihypertensives at home. Suspect this is due to toxicity from accumulation d/t renal failure. Antihypertensives are being held due to the patient's hypotension since admission. Goal MAP remains > 65.  Acute respiratory failure 2/2 Pulmonary edema and Bilateral Pleural Effusions: has CRRT Hx OSA on home CPAP.  -on Lasix as above   Mild Macrocytic anemia: Hgb 9.5 on 09/25/19 -->8.9. likely chronic due to progressive CKD: There is no evidence of bleeding.  -Monitor and transfuse for hemoglobin less than 7.  Elevated LFTs: Resolved  Hypothyroidism: TSH minimally elevated at 6.8. Continue Synthroid as at  home. Likely euthyroid sick syndrome.  H/O seizure: Continue phenytoin and monitor levels.  H/O GERD: Continue PPI.  Severe Malnutrition: I appreciate nutrition's assistance. Continue prostat  and ensure.  Deconditioning: The patient will likely require SNF or rehab at discharge. PT/OT ordered.   DVT prophylaxis: SCD Code Status: fall Family Communication: daugher Disposition Plan Barriers for discharge:  Patient is not ready to be discharged.  Patient still has significant leukocytosis with WBC 25.4.  Patient still needs high-dose IV diuretics.  Consultants:   PCCM  Nephrology  Hematology/Oncology  Procedures:  CRRT  HD  Thoracentesis  Bone marrow biopsy  Antimicrobials:  Anti-infectives (From admission, onward)   Start     Dose/Rate Route Frequency Ordered Stop   09/30/19 0100  piperacillin-tazobactam (ZOSYN) IVPB 3.375 g     3.375 g 12.5 mL/hr over 240 Minutes Intravenous Every 12 hours 09/29/19 1130     09/29/19 1130  vancomycin variable dose per unstable renal function (pharmacist dosing)      Does not apply See admin instructions 09/29/19 1130     09/29/19 1130  piperacillin-tazobactam (ZOSYN) IVPB 3.375 g     3.375 g 100 mL/hr over 30 Minutes Intravenous  Once 09/29/19 1115 09/29/19 2015   09/29/19 1130  vancomycin (VANCOCIN) 2,000 mg in sodium chloride 0.9 % 500 mL IVPB     2,000 mg 250 mL/hr over 120 Minutes Intravenous  Once 09/29/19 1115 09/29/19 2015        Subjective: Patient has subjective fever, chills, no chest pain, abdominal pain, nausea vomiting or diarrhea.  His temperature is 98.8.  Objective: Vitals:   09/30/19 1200 09/30/19 1300 09/30/19 1400 09/30/19 1500  BP: (!) 104/51 (!) 108/48 (!) 108/51   Pulse: 72 68 70 71  Resp: 17 (!) 33 (!) 22 (!) 24  Temp: 97.8 F (36.6 C)     TempSrc: Oral     SpO2: 98% 98% 98% 99%  Weight:      Height:        Intake/Output Summary (Last 24 hours) at 09/30/2019 1516 Last data filed at 09/30/2019 1500 Gross per 24 hour  Intake 191.62 ml  Output 2450 ml  Net -2258.38 ml   Filed Weights   09/29/19 1334 09/29/19 1612 09/30/19 0350  Weight: 103.7 kg 103.6 kg 104.9 kg     Examination: Physical Exam:  General: Not in acute distress HEENT: PERRL, EOMI, no scleral icterus, No JVD or bruit Cardiac: S1/S2, RRR, No murmurs, gallops or rubs Pulm: No rales, wheezing, rhonchi or rubs. Abd: Soft, nondistended, nontender, no rebound pain, no organomegaly, BS present Ext: 1+ edema. 2+DP/PT pulse bilaterally Musculoskeletal: No joint deformities, erythema, or stiffness, ROM full Skin: No rashes.  Neuro: Alert and oriented X3, cranial nerves II-XII grossly intact, muscle strength 5/5 in all extremeties. Psych: Patient is not psychotic, no suicidal or hemocidal ideation.   Data Reviewed: I have personally reviewed following labs and imaging studies  CBC: Recent Labs  Lab 09/25/19 0425 09/25/19 0436 09/26/19 0700 09/27/19 0355 09/28/19 0416 09/29/19 0314  WBC 7.6  --  11.1* 9.9 17.0* 25.4*  NEUTROABS  --   --   --   --   --  18.9*  HGB 10.4* 9.5* 9.2* 8.8* 8.6* 8.9*  HCT 30.9* 28.0* 28.2* 26.2* 26.7* 26.7*  MCV 100.0  --  100.4* 100.4* 101.9* 101.9*  PLT 133*  --  174 144* 151 272*   Basic Metabolic Panel: Recent Labs  Lab 09/24/19 0619  09/25/19 0412  09/26/19  3903  09/27/19 0355 09/27/19 1712 09/28/19 0416 09/28/19 1708 09/29/19 0314 09/30/19 0756  NA 135   < > 135   < > 139   < > 135 134* 135 133* 136 135  K 4.8   < > 4.4   < > 4.4   < > 4.1 4.3 4.4 3.9 4.3 4.2  CL 103   < > 103   < > 102   < > 103 103 103 100 102 101  CO2 22   < > 24   < > 25   < > 26 24 25 26 27 26   GLUCOSE 112*   < > 100*   < > 84   < > 90 118* 99 127* 102* 110*  BUN 74*   < > 46*   < > 26*   < > 17 20 22 14 17  28*  CREATININE 5.10*   < > 3.31*   < > 2.24*   < > 1.84* 2.28* 2.61* 2.02* 2.49* 3.38*  CALCIUM 7.1*   < > 6.8*   < > 6.8*   < > 6.5* 7.1* 7.2* 7.5* 7.4* 7.5*  MG 2.1  --  2.1  --  2.3  --  2.0  --  2.0  --   --   --   PHOS 6.2*   < > 4.0   < > 2.6   < > 2.8 2.1* 2.5 2.4*  --  3.5   < > = values in this interval not displayed.   GFR: Estimated Creatinine  Clearance: 18 mL/min (A) (by C-G formula based on SCr of 3.38 mg/dL (H)). Liver Function Tests: Recent Labs  Lab 09/26/19 0420  09/27/19 0355 09/27/19 1712 09/28/19 0416 09/28/19 1708 09/30/19 0756  AST 52*  --  61*  --   --   --   --   ALT 33  --  36  --   --   --   --   ALKPHOS 145*  --  149*  --   --   --   --   BILITOT 0.2*  --  0.3  --   --   --   --   PROT 4.3*  --  3.9* 3.9*  --   --   --   ALBUMIN 1.7*   1.6*   < > 1.4* 1.4* 1.4* 1.4* 1.4*   < > = values in this interval not displayed.   No results for input(s): LIPASE, AMYLASE in the last 168 hours. No results for input(s): AMMONIA in the last 168 hours. Coagulation Profile: No results for input(s): INR, PROTIME in the last 168 hours. Cardiac Enzymes: No results for input(s): CKTOTAL, CKMB, CKMBINDEX, TROPONINI in the last 168 hours. BNP (last 3 results) No results for input(s): PROBNP in the last 8760 hours. HbA1C: No results for input(s): HGBA1C in the last 72 hours. CBG: Recent Labs  Lab 09/23/19 2230 09/24/19 2008  GLUCAP 109* 107*   Lipid Profile: No results for input(s): CHOL, HDL, LDLCALC, TRIG, CHOLHDL, LDLDIRECT in the last 72 hours. Thyroid Function Tests: No results for input(s): TSH, T4TOTAL, FREET4, T3FREE, THYROIDAB in the last 72 hours. Anemia Panel: No results for input(s): VITAMINB12, FOLATE, FERRITIN, TIBC, IRON, RETICCTPCT in the last 72 hours. Sepsis Labs: Recent Labs  Lab 09/29/19 0741 09/30/19 0304  PROCALCITON 0.92 0.88    Recent Results (from the past 240 hour(s))  SARS CORONAVIRUS 2 (TAT 6-24 HRS) Nasopharyngeal Nasopharyngeal Swab  Status: None   Collection Time: 09/22/19  3:31 AM   Specimen: Nasopharyngeal Swab  Result Value Ref Range Status   SARS Coronavirus 2 NEGATIVE NEGATIVE Final    Comment: (NOTE) SARS-CoV-2 target nucleic acids are NOT DETECTED. The SARS-CoV-2 RNA is generally detectable in upper and lower respiratory specimens during the acute phase of  infection. Negative results do not preclude SARS-CoV-2 infection, do not rule out co-infections with other pathogens, and should not be used as the sole basis for treatment or other patient management decisions. Negative results must be combined with clinical observations, patient history, and epidemiological information. The expected result is Negative. Fact Sheet for Patients: SugarRoll.be Fact Sheet for Healthcare Providers: https://www.woods-mathews.com/ This test is not yet approved or cleared by the [place] FDA and  has been authorized for detection and/or diagnosis of SARS-CoV-2 by FDA under an Emergency Use Authorization (EUA). This EUA will remain  in effect (meaning this test can be used) for the duration of the COVID-19 declaration under Section 56 4(b)(1) of the Act, 21 U.S.C. section 360bbb-3(b)(1), unless the authorization is terminated or revoked sooner. Performed at [facility], [place] [address] [place], [place] [address]   MRSA PCR Screening     Status: None   Collection Time: 09/23/19  6:38 AM   Specimen: Nasal Mucosa; Nasopharyngeal  Result Value Ref Range Status   MRSA by PCR NEGATIVE NEGATIVE Final    Comment:        The GeneXpert MRSA Assay (FDA approved for NASAL specimens only), is one component of a comprehensive MRSA colonization surveillance program. It is not intended to diagnose MRSA infection nor to guide or monitor treatment for MRSA infections. Performed at [facility], [address], [place], [place] [address]   Body fluid culture     Status: None (Preliminary result)   Collection Time: 09/27/19  2:09 PM   Specimen: Body Fluid  Result Value Ref Range Status   Specimen Description FLUID RIGHT PLEURAL  Final   Special Requests NONE  Final   Gram Stain NO WBC SEEN NO ORGANISMS SEEN   Final   Culture   Final    NO GROWTH 3 DAYS Performed at [facility], [address],  [place], [place] [address]    Report Status PENDING  Incomplete  Culture, blood (routine x 2)     Status: None (Preliminary result)   Collection Time: 09/29/19  8:00 AM   Specimen: BLOOD LEFT ARM  Result Value Ref Range Status   Specimen Description BLOOD LEFT ARM  Final   Special Requests   Final    BOTTLES DRAWN AEROBIC AND ANAEROBIC Blood Culture adequate volume   Culture   Final    NO GROWTH 1 DAY Performed at [facility], [place] [address] [place], [place] [address]    Report Status PENDING  Incomplete  Culture, blood (routine x 2)     Status: None (Preliminary result)   Collection Time: 09/29/19  8:20 AM   Specimen: BLOOD  Result Value Ref Range Status   Specimen Description BLOOD LEFT ANTECUBITAL  Final   Special Requests   Final    BOTTLES DRAWN AEROBIC AND ANAEROBIC Blood Culture adequate volume   Culture   Final    NO GROWTH 1 DAY Performed at [facility], [place] [address], [place], [place] [address]    Report Status PENDING  Incomplete  Culture, Urine     Status: None (Preliminary result)  Collection Time: 09/29/19  8:09 PM   Specimen: Urine, Random  Result Value Ref Range Status   Specimen Description URINE, RANDOM  Final   Special Requests NONE  Final   Culture   Final    CULTURE REINCUBATED FOR BETTER GROWTH Performed at [facility], [place] [address], [place], [place] [address]    Report Status PENDING  Incomplete     Radiology Studies: Dg Chest Port 1 View  Result Date: 09/29/2019 CLINICAL DATA:  Leukocytosis.  Shortness of breath EXAM: PORTABLE CHEST 1 VIEW COMPARISON:  Two days ago FINDINGS: Haziness of the bilateral lower chest is unchanged. Cardiomegaly without edema in the apical lungs. No pneumothorax. Right IJ line with tip at the SVC. Notable advanced glenohumeral osteoarthritis with loose body on the right. IMPRESSION: Stable opacification at the bases from atelectasis/pneumonia and small pleural effusion. Electronically Signed   By:  Monte Fantasia M.D.   On: 09/29/2019 10:52   Dg Bone Survey Met  Result Date: 09/28/2019 CLINICAL DATA:  Elevated immunoglobulin free light chain EXAM: METASTATIC BONE SURVEY COMPARISON:  None. FINDINGS: There is diffuse osteopenia seen throughout. Skull: No focal lytic or blastic osseous lesion. Cervical Spine: No focal lytic or blastic osseous lesions. Multilevel degenerative changes are seen. Thoracic Spine: Multilevel degenerative changes are seen. No focal lytic or blastic lesions are seen. Anterior flowing osteophytes are noted. Chest: Mild cardiomegaly. Aortic calcifications. A right-sided central venous catheter seen with the tip at the upper SVC. A probable small left pleural effusion is seen. No lytic or blastic lesions. Lumbar Spine: Disc loss height with facet arthrosis most notable at L4-L5 and L5-S1. No lytic or blastic lesions. Scattered vascular calcifications. Pelvis: No focal lytic or blastic osseous lesion. Bilateral hip osteoarthritis. Right Upper Extremity: Glenohumeral joint and AC joint arthrosis seen with joint space loss. Periosteal reaction seen along the lateral humerus. Enthesophytes seen at the elbow. No focal lytic or blastic osseous lesion. Left Upper Extremity: Glenohumeral joint and AC joint arthrosis. These fight seen at the elbow. No focal lytic or blastic osseous lesion. Right Lower Extremity: Right hip and knee osteoarthritis seen superior joint space and osteophyte formation. No focal lytic or blastic osseous lesion. Left Lower Extremity: Left knee and hip osteoarthritis is seen. No focal lytic or blastic osseous lesion. IMPRESSION: No lytic or blastic lesions throughout. Diffuse osteopenia Electronically Signed   By: Prudencio Pair M.D.   On: 09/28/2019 20:51        Scheduled Meds:  Chlorhexidine Gluconate Cloth  6 each Topical Daily   Chlorhexidine Gluconate Cloth  6 each Topical Q0600   Chlorhexidine Gluconate Cloth  6 each Topical Q0600   darbepoetin  (ARANESP) injection - NON-DIALYSIS  60 mcg Subcutaneous Q Sun-1800   docusate sodium  100 mg Oral Daily   feeding supplement (ENSURE ENLIVE)  237 mL Oral BID BM   feeding supplement (PRO-STAT SUGAR FREE 64)  30 mL Oral BID   fentaNYL       furosemide  80 mg Intravenous Q12H   heparin  5,000 Units Subcutaneous Q8H   levothyroxine  25 mcg Oral Q0600   midazolam       midodrine  10 mg Oral TID WC   pantoprazole  40 mg Oral Daily   phenytoin  300 mg Oral QHS   sodium chloride flush  3 mL Intravenous Q12H   vancomycin variable dose per unstable renal function (pharmacist dosing)   Does not apply See admin instructions   Continuous Infusions:  sodium chloride Stopped (09/30/19 1153)   piperacillin-tazobactam (ZOSYN)  IV 12.5 mL/hr at 09/30/19 1156     LOS: 8 days    Time spent: 30 min    Ivor Costa, DO [facility] PAGER is on AMION  If 7PM-7AM, please contact night-coverage [email or web address] Password [email or web address] 09/30/2019, 3:16 PM

## 2019-10-01 DIAGNOSIS — I509 Heart failure, unspecified: Secondary | ICD-10-CM

## 2019-10-01 LAB — RENAL FUNCTION PANEL
Albumin: 1.3 g/dL — ABNORMAL LOW (ref 3.5–5.0)
Anion gap: 11 (ref 5–15)
BUN: 35 mg/dL — ABNORMAL HIGH (ref 8–23)
CO2: 27 mmol/L (ref 22–32)
Calcium: 7.5 mg/dL — ABNORMAL LOW (ref 8.9–10.3)
Chloride: 99 mmol/L (ref 98–111)
Creatinine, Ser: 3.58 mg/dL — ABNORMAL HIGH (ref 0.44–1.00)
GFR calc Af Amer: 14 mL/min — ABNORMAL LOW (ref >60)
GFR calc non Af Amer: 12 mL/min — ABNORMAL LOW (ref >60)
Glucose, Bld: 98 mg/dL (ref 70–99)
Phosphorus: 3.8 mg/dL (ref 2.5–4.6)
Potassium: 4.3 mmol/L (ref 3.5–5.1)
Sodium: 137 mmol/L (ref 135–145)

## 2019-10-01 LAB — PROCALCITONIN: Procalcitonin: 0.79 ng/mL

## 2019-10-01 LAB — CBC
HCT: 26.7 % — ABNORMAL LOW (ref 36.0–46.0)
Hemoglobin: 8.8 g/dL — ABNORMAL LOW (ref 12.0–15.0)
MCH: 33.8 pg (ref 26.0–34.0)
MCHC: 33 g/dL (ref 30.0–36.0)
MCV: 102.7 fL — ABNORMAL HIGH (ref 80.0–100.0)
Platelets: 214 10*3/uL (ref 150–400)
RBC: 2.6 MIL/uL — ABNORMAL LOW (ref 3.87–5.11)
RDW: 16.3 % — ABNORMAL HIGH (ref 11.5–15.5)
WBC: 12.1 10*3/uL — ABNORMAL HIGH (ref 4.0–10.5)
nRBC: 0.2 % (ref 0.0–0.2)

## 2019-10-01 LAB — BODY FLUID CULTURE
Culture: NO GROWTH
Gram Stain: NONE SEEN

## 2019-10-01 LAB — URINE CULTURE: Culture: >=100000 — AB

## 2019-10-01 LAB — LIPASE, BLOOD: Lipase: 54 U/L — ABNORMAL HIGH (ref 11–51)

## 2019-10-01 LAB — VANCOMYCIN, RANDOM: Vancomycin Rm: 15

## 2019-10-01 MED ORDER — CEFAZOLIN SODIUM-DEXTROSE 1-4 GM/50ML-% IV SOLN
1.0000 g | Freq: Two times a day (BID) | INTRAVENOUS | Status: DC
  Administered 2019-10-01 – 2019-10-07 (×13): 1 g via INTRAVENOUS
  Filled 2019-10-01 (×15): qty 50

## 2019-10-01 NOTE — Progress Notes (Addendum)
PROGRESS NOTE    Sophia Mcmahon  VFI:[id number] DOB: 05/23/1941 DOA: 09/21/2019 PCP: Frances Maywood, FNP    Brief Narrative:   78 year old female admitted 9/30 to [facility] with 30lb weight gain in 6 weeks & progressive renal failure, tx to [facility] on 10/2 in the setting of progressive cardiorenal syndrome, hypotension, and worsening acute on chronic renal failure. The patient required dopamine infusion in the ICU. She had a HD catheter placed, and  she underwent CRRT until the catheter clotted off. As her FENa was low and consistent with cardiorenal syndrome or acute glomerulonephropathy. Urine immunofixation and UPEP were performed. Results are consistent with multiple myeloma.  Hematology Oncology was consulted on 09/28/2019. Bone scan was performed on 09/28/2019. I have discussed this with the patient's daughter. She stated that she will convey this to oncology today.   Interim History: -BM biopsy was performed by Dr. Levan Hurst of IR 09/30/19 -narrow down antibiotics of vancomycin and Zosyn Ancef   Assessment & Plan:   Principal Problem:   Acute renal failure superimposed on chronic kidney disease ([place]) Active Problems:   Protein-calorie malnutrition, severe (HCC)   Abnormal liver function   Anemia   Anasarca   Acute on chronic diastolic CHF (congestive heart failure) (HCC)   Cardiorenal syndrome   Acute renal failure (ARF) (HCC)   Pleural effusion   Leukocytosis   Multiple myeloma ([place])   Acute renal failure superimposed on chronic kidney disease -IV: Baseline creatinine/renal function unclear though at one time reported as CKD IV- followed by nephrologist pre admit ): Acute kidney injury likely from acute cardiorenal syndrome versus ischemic ATN- was also on ARB prior to admit.  Developed anuria/oliguria with worsening azotemia and hyperkalemia.  started CRRT on 10/3 early AM - stopped on 10/6. Renal was consulted.  Dr. Moshe Cipro saw patient today did not  think patient need acute dialysis today.  -hold dialysis and continue diuresis with IV lasix 80 mg per Dr. Moshe Cipro -check BMP in AM.  Acute on Chronic diastolic HF: With transudative bilateral  pleural effusions and pulmonary edema. I liter removed in thoracentesis yesterday. Monitor volume status.  The patient is stable on room air. Echocardiogram performed on 09/22/2019 demonstrated EF of 41-66% and diastolic dysfunction. This study strongly suggests that the patient's volume overload is not cardiogenic in nature. -on lasix 80 mg bid  Cellulitis of the right arm: Right arm was swollen and was found to be negative for DVT by ultrasound on 10/7. Pt has been on vancomycin and Zosyn.  No fever.  Leukocytosis improved from 25.4 to 12.1 today. lood cultures have been obtained, so far no grow. -Narrow down antibiotics to Ancef by IV   Hypotension: Blood pressures are improved with midodrine 10 mg tid. - Monitor. BP  Light Chain Disease: Concern for multiple myeloma. Hematology/Oncology has been consulted. Appreciate their assistance. - BM biopsy was performed by Dr. Levan Hurst of IR 09/30/19  Hx hypertension: The patient had been on multiple antihypertensives at home. Suspect this is due to toxicity from accumulation d/t renal failure. Antihypertensives are being held due to the patient's hypotension since admission. Goal MAP remains > 65.  Acute respiratory failure 2/2 Pulmonary edema and Bilateral Pleural Effusions: has CRRT Hx OSA on home CPAP.  -on Lasix as above   Mild Macrocytic anemia: Hgb 9.5 on 09/25/19 -->8.9 -->8.8. likely chronic due to progressive CKD: There is no evidence of bleeding.  -Monitor and transfuse for hemoglobin less than 7.  Elevated LFTs:  Resolved  Hypothyroidism: TSH minimally elevated at 6.8. Continue Synthroid as at home. Likely euthyroid sick syndrome.  H/O seizure: Continue phenytoin  H/O GERD: Continue PPI.  Severe Malnutrition: I appreciate  nutrition's assistance. Continue prostat and ensure.  Deconditioning: The patient will likely require SNF or rehab at discharge. PT/OT ordered.  Addendum: Lower abdominal pain, etiology is not clear.  Patient does not have nausea vomiting.  She had one loose stool bowel movement.  Currently no fever or chills.  On physical examination, patient does not seem to have acute abdomen. -Check lipase -Observe closely, if getting worse will need CT scan.   DVT prophylaxis: SCD Code Status: fall Family Communication: tried to call her daughter, but she did not pick up phone Disposition Plan Barriers for discharge:  Patient is not ready to be discharged.  Patient still has leukocytosis with WBC 12.5.  Patient still needs high-dose IV diuretics.  Consultants:   PCCM  Nephrology  Hematology/Oncology  Procedures:  CRRT  HD  Thoracentesis  Bone marrow biopsy  Antimicrobials:  Anti-infectives (From admission, onward)   Start     Dose/Rate Route Frequency Ordered Stop   10/01/19 1300  ceFAZolin (ANCEF) IVPB 1 g/50 mL premix     1 g 100 mL/hr over 30 Minutes Intravenous Every 12 hours 10/01/19 1150     09/30/19 0100  piperacillin-tazobactam (ZOSYN) IVPB 3.375 g  Status:  Discontinued     3.375 g 12.5 mL/hr over 240 Minutes Intravenous Every 12 hours 09/29/19 1130 10/01/19 1127   09/29/19 1130  vancomycin variable dose per unstable renal function (pharmacist dosing)  Status:  Discontinued      Does not apply See admin instructions 09/29/19 1130 10/01/19 1150   09/29/19 1130  piperacillin-tazobactam (ZOSYN) IVPB 3.375 g     3.375 g 100 mL/hr over 30 Minutes Intravenous  Once 09/29/19 1115 09/29/19 2015   09/29/19 1130  vancomycin (VANCOCIN) 2,000 mg in sodium chloride 0.9 % 500 mL IVPB     2,000 mg 250 mL/hr over 120 Minutes Intravenous  Once 09/29/19 1115 09/29/19 2015        Subjective:  Patient reports mild lower abdominal abdominal pain, but no nausea or vomiting.  She had  one loose stool bowel movement. Patient has no subjective fever, chills, no chest pain. His temperature is 98.8.  Objective: Vitals:   10/01/19 1400 10/01/19 1500 10/01/19 1600 10/01/19 1633  BP: (!) 115/55 (!) 124/58    Pulse: 76 72 76   Resp: (!) 21 (!) 23 (!) 22   Temp:    97.8 F (36.6 C)  TempSrc:    Oral  SpO2: 98% 99% 98%   Weight:      Height:        Intake/Output Summary (Last 24 hours) at 10/01/2019 1726 Last data filed at 10/01/2019 1725 Gross per 24 hour  Intake 840.41 ml  Output 2275 ml  Net -1434.59 ml   Filed Weights   09/29/19 1612 09/30/19 0350 10/01/19 0320  Weight: 103.6 kg 104.9 kg 102.3 kg    Examination: Physical Exam:  General: Not in acute distress HEENT: PERRL, EOMI, no scleral icterus, No JVD or bruit Cardiac: S1/S2, RRR, No murmurs, gallops or rubs Pulm: No rales, wheezing, rhonchi or rubs. Abd: Soft, nondistended, has mild lower abdominal tenderness, no rebound pain, no organomegaly, BS present Ext: 1+ edema. 2+DP/PT pulse bilaterally Musculoskeletal: No joint deformities, erythema, or stiffness, ROM full. Right forearm is tender and mildly swelling. Skin: No rashes.  Neuro:  Alert and oriented X3, cranial nerves II-XII grossly intact, muscle strength 5/5 in all extremeties. Psych: Patient is not psychotic, no suicidal or hemocidal ideation.   Data Reviewed: I have personally reviewed following labs and imaging studies  CBC: Recent Labs  Lab 09/26/19 0700 09/27/19 0355 09/28/19 0416 09/29/19 0314 10/01/19 0258  WBC 11.1* 9.9 17.0* 25.4* 12.1*  NEUTROABS  --   --   --  18.9*  --   HGB 9.2* 8.8* 8.6* 8.9* 8.8*  HCT 28.2* 26.2* 26.7* 26.7* 26.7*  MCV 100.4* 100.4* 101.9* 101.9* 102.7*  PLT 174 144* 151 141* 056   Basic Metabolic Panel: Recent Labs  Lab 09/25/19 0412  09/26/19 0420  09/27/19 0355 09/27/19 1712 09/28/19 0416 09/28/19 1708 09/29/19 0314 09/30/19 0756 10/01/19 0258  NA 135   < > 139   < > 135 134* 135 133*  136 135 137  K 4.4   < > 4.4   < > 4.1 4.3 4.4 3.9 4.3 4.2 4.3  CL 103   < > 102   < > 103 103 103 100 102 101 99  CO2 24   < > 25   < > 26 24 25 26 27 26 27   GLUCOSE 100*   < > 84   < > 90 118* 99 127* 102* 110* 98  BUN 46*   < > 26*   < > 17 20 22 14 17  28* 35*  CREATININE 3.31*   < > 2.24*   < > 1.84* 2.28* 2.61* 2.02* 2.49* 3.38* 3.58*  CALCIUM 6.8*   < > 6.8*   < > 6.5* 7.1* 7.2* 7.5* 7.4* 7.5* 7.5*  MG 2.1  --  2.3  --  2.0  --  2.0  --   --   --   --   PHOS 4.0   < > 2.6   < > 2.8 2.1* 2.5 2.4*  --  3.5 3.8   < > = values in this interval not displayed.   GFR: Estimated Creatinine Clearance: 16.7 mL/min (A) (by C-G formula based on SCr of 3.58 mg/dL (H)). Liver Function Tests: Recent Labs  Lab 09/26/19 0420  09/27/19 0355 09/27/19 1712 09/28/19 0416 09/28/19 1708 09/30/19 0756 10/01/19 0258  AST 52*  --  61*  --   --   --   --   --   ALT 33  --  36  --   --   --   --   --   ALKPHOS 145*  --  149*  --   --   --   --   --   BILITOT 0.2*  --  0.3  --   --   --   --   --   PROT 4.3*  --  3.9* 3.9*  --   --   --   --   ALBUMIN 1.7*   1.6*   < > 1.4* 1.4* 1.4* 1.4* 1.4* 1.3*   < > = values in this interval not displayed.   No results for input(s): LIPASE, AMYLASE in the last 168 hours. No results for input(s): AMMONIA in the last 168 hours. Coagulation Profile: No results for input(s): INR, PROTIME in the last 168 hours. Cardiac Enzymes: No results for input(s): CKTOTAL, CKMB, CKMBINDEX, TROPONINI in the last 168 hours. BNP (last 3 results) No results for input(s): PROBNP in the last 8760 hours. HbA1C: No results for input(s): HGBA1C in the last 72 hours. CBG: Recent Labs  Lab 09/24/19 2008  GLUCAP 107*   Lipid Profile: No results for input(s): CHOL, HDL, LDLCALC, TRIG, CHOLHDL, LDLDIRECT in the last 72 hours. Thyroid Function Tests: No results for input(s): TSH, T4TOTAL, FREET4, T3FREE, THYROIDAB in the last 72 hours. Anemia Panel: No results for input(s):  VITAMINB12, FOLATE, FERRITIN, TIBC, IRON, RETICCTPCT in the last 72 hours. Sepsis Labs: Recent Labs  Lab 09/29/19 0741 09/30/19 0304 10/01/19 0258  PROCALCITON 0.92 0.88 0.79    Recent Results (from the past 240 hour(s))  SARS CORONAVIRUS 2 (TAT 6-24 HRS) Nasopharyngeal Nasopharyngeal Swab     Status: None   Collection Time: 09/22/19  3:31 AM   Specimen: Nasopharyngeal Swab  Result Value Ref Range Status   SARS Coronavirus 2 NEGATIVE NEGATIVE Final    Comment: (NOTE) SARS-CoV-2 target nucleic acids are NOT DETECTED. The SARS-CoV-2 RNA is generally detectable in upper and lower respiratory specimens during the acute phase of infection. Negative results do not preclude SARS-CoV-2 infection, do not rule out co-infections with other pathogens, and should not be used as the sole basis for treatment or other patient management decisions. Negative results must be combined with clinical observations, patient history, and epidemiological information. The expected result is Negative. Fact Sheet for Patients: SugarRoll.be Fact Sheet for Healthcare Providers: https://www.woods-mathews.com/ This test is not yet approved or cleared by the [place] FDA and  has been authorized for detection and/or diagnosis of SARS-CoV-2 by FDA under an Emergency Use Authorization (EUA). This EUA will remain  in effect (meaning this test can be used) for the duration of the COVID-19 declaration under Section 56 4(b)(1) of the Act, 21 U.S.C. section 360bbb-3(b)(1), unless the authorization is terminated or revoked sooner. Performed at [facility], [place] [address] [place], [place] [address]   MRSA PCR Screening     Status: None   Collection Time: 09/23/19  6:38 AM   Specimen: Nasal Mucosa; Nasopharyngeal  Result Value Ref Range Status   MRSA by PCR NEGATIVE NEGATIVE Final    Comment:        The GeneXpert MRSA Assay (FDA approved for NASAL  specimens only), is one component of a comprehensive MRSA colonization surveillance program. It is not intended to diagnose MRSA infection nor to guide or monitor treatment for MRSA infections. Performed at [facility], [address] [place], [place] [address]   Body fluid culture     Status: None   Collection Time: 09/27/19  2:09 PM   Specimen: Body Fluid  Result Value Ref Range Status   Specimen Description FLUID RIGHT PLEURAL  Final   Special Requests NONE  Final   Gram Stain NO WBC SEEN NO ORGANISMS SEEN   Final   Culture   Final    NO GROWTH 3 DAYS Performed at [facility], [address] [place], [place] [address]    Report Status 10/01/2019 FINAL  Final  Culture, blood (routine x 2)     Status: None (Preliminary result)   Collection Time: 09/29/19  8:00 AM   Specimen: BLOOD LEFT ARM  Result Value Ref Range Status   Specimen Description BLOOD LEFT ARM  Final   Special Requests   Final    BOTTLES DRAWN AEROBIC AND ANAEROBIC Blood Culture adequate volume   Culture   Final    NO GROWTH 2 DAYS Performed at [facility], [place] [address], [place], [place] [address]    Report Status PENDING  Incomplete  Culture, blood (routine x 2)  Status: None (Preliminary result)   Collection Time: 09/29/19  8:20 AM   Specimen: BLOOD  Result Value Ref Range Status   Specimen Description BLOOD LEFT ANTECUBITAL  Final   Special Requests   Final    BOTTLES DRAWN AEROBIC AND ANAEROBIC Blood Culture adequate volume   Culture   Final    NO GROWTH 2 DAYS Performed at [facility], [address] [place], [place] [address]    Report Status PENDING  Incomplete  Culture, Urine     Status: Abnormal   Collection Time: 09/29/19  8:09 PM   Specimen: Urine, Random  Result Value Ref Range Status   Specimen Description URINE, RANDOM  Final   Special Requests   Final    NONE Performed at [facility], [place] [address], [place], [place] [address]    Culture  >=100,000 COLONIES/mL STAPHYLOCOCCUS LUGDUNENSIS (A)  Final   Report Status 10/01/2019 FINAL  Final   Organism ID, Bacteria STAPHYLOCOCCUS LUGDUNENSIS (A)  Final      Susceptibility   Staphylococcus lugdunensis - MIC*    CIPROFLOXACIN <=0.5 SENSITIVE Sensitive     GENTAMICIN <=0.5 SENSITIVE Sensitive     NITROFURANTOIN <=16 SENSITIVE Sensitive     OXACILLIN >=4 RESISTANT Resistant     TETRACYCLINE <=1 SENSITIVE Sensitive     VANCOMYCIN <=0.5 SENSITIVE Sensitive     TRIMETH/SULFA <=10 SENSITIVE Sensitive     CLINDAMYCIN >=8 RESISTANT Resistant     RIFAMPIN <=0.5 SENSITIVE Sensitive     Inducible Clindamycin NEGATIVE Sensitive     * >=100,000 COLONIES/mL STAPHYLOCOCCUS LUGDUNENSIS     Radiology Studies: No results found.      Scheduled Meds:  Chlorhexidine Gluconate Cloth  6 each Topical Daily   Chlorhexidine Gluconate Cloth  6 each Topical Q0600   Chlorhexidine Gluconate Cloth  6 each Topical Q0600   darbepoetin (ARANESP) injection - NON-DIALYSIS  60 mcg Subcutaneous Q Sun-1800   docusate sodium  100 mg Oral Daily   feeding supplement (ENSURE ENLIVE)  237 mL Oral BID BM   feeding supplement (PRO-STAT SUGAR FREE 64)  30 mL Oral BID   furosemide  80 mg Intravenous Q12H   heparin  5,000 Units Subcutaneous Q8H   levothyroxine  25 mcg Oral Q0600   midodrine  10 mg Oral TID WC   pantoprazole  40 mg Oral Daily   phenytoin  300 mg Oral QHS   sodium chloride flush  3 mL Intravenous Q12H   Continuous Infusions:  sodium chloride Stopped (10/01/19 1448)    ceFAZolin (ANCEF) IV Stopped (10/01/19 1350)     LOS: 9 days    Time spent: 30 min    Ivor Costa, DO [facility] PAGER is on AMION  If 7PM-7AM, please contact night-coverage [email or web address] Password [email or web address] 10/01/2019, 5:26 PM

## 2019-10-01 NOTE — Progress Notes (Signed)
Patient ID: Sophia Mcmahon, female   DOB: [birth year], 78 y.o.   MRN: [id number]  [facility] Progress Note   Assessment/ Plan:   1. Acute kidney Injury on chronic kidney disease (baseline creatinine/renal function unclear though at one time reported as CKD IV- followed by nephrologist pre admit ): Acute kidney injury likely from acute cardiorenal syndrome versus ischemic ATN- was also on ARB prior to admit.  Developed anuria/oliguria with worsening azotemia and hyperkalemia.  started CRRT on 10/3 early AM - stopped on 10/6 - had intermittent tx on Wed, and  Thursday.  Does not seem to have acute needs for HD today -  , will hold dialysis   2.  Acute exacerbation of congestive heart failure: Started on renal replacement therapy for volume unloading.  Admitted with volume overload/refractory to outpatient diuretic therapy.  Not on pressors at this time but on midodrine.  Will try to challenge with lasix since having UOP-- she tells me she stays swollen, is already down 25 pounds from admit-  Is on room air - cont lasix 80 q 12-  Will not get all of her volume off quickly   3. Hypotension  - continue midodrine to augment pressure -  10 mg TID for now  5.  Anemia - acute : Likely from chronic illness including chronic kidney disease and dilution and volume excess.  Status post IV iron and continue ESA.  CBC daily    6. Leukocytosis Cultures neg to date- on zosyn and vanc- improving   7. Proteinuria    - no concurrent hematuria.  GN work-up really negative  UPEP with M spike    8. Abnormal free light chain ratio - concern for plasma cell dyscrasia   - note free light chain ratio low at 0.07 - Urine with m spike  - hem/onc has been consulted - for skeletal survey and bone marrow biopsy- diagnosis not yet clear   Subjective:    Had 1600 of UOP with lasix, slightly negative overall- BUN and crt a little higher but not dangerously so -  Looks good, not uremic      Objective:   BP  (!) 126/59   Pulse 76   Temp 98.3 F (36.8 C) (Oral)   Resp 10   Ht _0  (1.727 m)   Wt 102.3 kg   SpO2 99%   BMI 34.29 kg/m   Intake/Output Summary (Last 24 hours) at 10/01/2019 0730 Last data filed at 10/01/2019 0700 Gross per 24 hour  Intake 1026.75 ml  Output 1650 ml  Net -623.25 ml   Weight change: -1.4 kg  Physical Exam: Gen: Resting comfortably in bed  CVS: Pulse regular, S1 and S2 normal Resp: Anteriorly clear to auscultation, no distinct rales or rhonchi Abd: Soft, obese, nontender Ext: 2+ edema extremities bilaterally - improving  Neuro - alert and oriented x 3 follows commands Psych - normal mood and affect  GU - foley in place Access: RIJ nontunneled dialysis catheter placed 10/3  Imaging: Dg Chest Port 1 View  Result Date: 09/29/2019 CLINICAL DATA:  Leukocytosis.  Shortness of breath EXAM: PORTABLE CHEST 1 VIEW COMPARISON:  Two days ago FINDINGS: Haziness of the bilateral lower chest is unchanged. Cardiomegaly without edema in the apical lungs. No pneumothorax. Right IJ line with tip at the SVC. Notable advanced glenohumeral osteoarthritis with loose body on the right. IMPRESSION: Stable opacification at the bases from atelectasis/pneumonia and small pleural effusion. Electronically Signed   By: Neva Seat.D.  On: 09/29/2019 10:52    Labs: BMET Recent Labs  Lab 09/26/19 1554 09/27/19 0355 09/27/19 1712 09/28/19 0416 09/28/19 1708 09/29/19 0314 09/30/19 0756 10/01/19 0258  NA 136 135 134* 135 133* 136 135 137  K 4.3 4.1 4.3 4.4 3.9 4.3 4.2 4.3  CL 104 103 103 103 100 102 101 99  CO2 _0 GLUCOSE 110* 90 118* 99 127* 102* 110* 98  BUN _1 28* 35*  CREATININE 1.98* 1.84* 2.28* 2.61* 2.02* 2.49* 3.38* 3.58*  CALCIUM 6.9* 6.5* 7.1* 7.2* 7.5* 7.4* 7.5* 7.5*  PHOS 2.2* 2.8 2.1* 2.5 2.4*  --  3.5 3.8   CBC Recent Labs  Lab 09/27/19 0355 09/28/19 0416 09/29/19 0314 10/01/19 0258  WBC 9.9 17.0* 25.4* 12.1*   NEUTROABS  --   --  18.9*  --   HGB 8.8* 8.6* 8.9* 8.8*  HCT 26.2* 26.7* 26.7* 26.7*  MCV 100.4* 101.9* 101.9* 102.7*  PLT 144* 151 141* 214    Medications:    . Chlorhexidine Gluconate Cloth  6 each Topical Daily  . Chlorhexidine Gluconate Cloth  6 each Topical Q0600  . Chlorhexidine Gluconate Cloth  6 each Topical Q0600  . darbepoetin (ARANESP) injection - NON-DIALYSIS  60 mcg Subcutaneous Q Sun-1800  . docusate sodium  100 mg Oral Daily  . feeding supplement (ENSURE ENLIVE)  237 mL Oral BID BM  . feeding supplement (PRO-STAT SUGAR FREE 64)  30 mL Oral BID  . furosemide  80 mg Intravenous Q12H  . heparin  5,000 Units Subcutaneous Q8H  . levothyroxine  25 mcg Oral Q0600  . midodrine  10 mg Oral TID WC  . pantoprazole  40 mg Oral Daily  . phenytoin  300 mg Oral QHS  . sodium chloride flush  3 mL Intravenous Q12H  . vancomycin variable dose per unstable renal function (pharmacist dosing)   Does not apply See admin instructions   Louis Meckel 10/01/2019, 7:30 AM

## 2019-10-01 NOTE — Evaluation (Signed)
Occupational Therapy Evaluation Patient Details Name: Sophia Mcmahon MRN: [id number] DOB: 1941-09-07 Today's Date: 10/01/2019    History of Present Illness 78 year old woman with CAD, OSA, hypertension, hyperlipidemia, obesity, HFpEF (TTE 09/22/2019 EF 65 to 70%), hypothyroidism, CKD with unknown baseline presenting with acute heart failure and acute on chronic renal failure. Placed on CRRT at [facility] 09/24/19-09/27/19. s/p thoracentesis R pleural effusion 09/27/19   Clinical Impression   Pt PTA: living alone, independent. Pt currently limited by weakness, poor activity tolerance and decreased ability to care for self.  Pt performing grooming at sink with minguardA; RUE limited due to increased edema, but still functional in use with no pain. Pt standing at sink x5 mins for grooming tasks and back pain flaring up. Pt performing mobility in room with minA overall for lines and maneuvering RW through obstacles. Pt minA overall for UB ADL and maxA for LB ADL. Pt would benefit from continued OT skilled services for ADL, mobility and safety. OT following acutely.  VSS. 130/57 after exertion, 84 BPM, 93% O2 on RA     Follow Up Recommendations  Home health OT;Supervision/Assistance - 24 hour    Equipment Recommendations  3 in 1 bedside commode    Recommendations for Other Services       Precautions / Restrictions Precautions Precautions: Fall Precaution Comments: catheter Restrictions Weight Bearing Restrictions: No      Mobility Bed Mobility Overal bed mobility: Needs Assistance Bed Mobility: Supine to Sit     Supine to sit: Mod assist     General bed mobility comments: Assist to for lines, increased time and trunk elevation  Transfers Overall transfer level: Needs assistance Equipment used: Rolling walker (2 wheeled) Transfers: Sit to/from Stand Sit to Stand: Min assist;From elevated surface         General transfer comment: Assist to stabilize with initial standing  balance    Balance Overall balance assessment: Needs assistance Sitting-balance support: Feet supported;No upper extremity supported Sitting balance-Leahy Scale: Good       Standing balance-Leahy Scale: Poor Standing balance comment: walker and min guard for static standing                           ADL either performed or assessed with clinical judgement   ADL Overall ADL's : Needs assistance/impaired Eating/Feeding: Set up;Sitting   Grooming: Min guard;Standing Grooming Details (indicate cue type and reason): x5 mins Upper Body Bathing: Minimal assistance;Sitting Upper Body Bathing Details (indicate cue type and reason): RUE weakness and edema, but still uses Lower Body Bathing: Maximal assistance;Sitting/lateral leans;Sit to/from stand;Cueing for safety   Upper Body Dressing : Minimal assistance;Sitting   Lower Body Dressing: Maximal assistance;Sit to/from stand;Sitting/lateral leans   Toilet Transfer: Minimal assistance;BSC;Ambulation;RW   Toileting- Clothing Manipulation and Hygiene: Maximal assistance;Sitting/lateral lean;Sit to/from stand;Cueing for safety       Functional mobility during ADLs: Min guard;Cueing for safety;Rolling walker General ADL Comments: Pt performing ADL tasks at sink with minguardA; RUE limited due to increased edema, but still functional in use with no pain. Pt requires increased assist due to poor activity tolerance and decreased ability to care for self.      Vision Baseline Vision/History: No visual deficits Vision Assessment?: No apparent visual deficits     Perception     Praxis      Pertinent Vitals/Pain Pain Assessment: Faces Faces Pain Scale: Hurts a little bit Pain Location: back Pain Descriptors / Indicators: Discomfort Pain Intervention(s): Limited activity  within patient's tolerance;Monitored during session     Hand Dominance Right   Extremity/Trunk Assessment Upper Extremity Assessment Upper Extremity  Assessment: Generalized weakness;RUE deficits/detail;LUE deficits/detail RUE Deficits / Details: increased edema limiting full ROM; 3-/5 MM grade  RUE Coordination: decreased fine motor LUE Deficits / Details: increased edema limiting full ROM    Lower Extremity Assessment Lower Extremity Assessment: Generalized weakness;Defer to PT evaluation   Cervical / Trunk Assessment Cervical / Trunk Assessment: Kyphotic   Communication Communication Communication: No difficulties   Cognition Arousal/Alertness: Awake/alert Behavior During Therapy: WFL for tasks assessed/performed Overall Cognitive Status: Within Functional Limits for tasks assessed                                     General Comments  VSS. 130/57 after exertion, 84 BPM, 93% O2 on RA    Exercises     Shoulder Instructions      Home Living Family/patient expects to be discharged to:: Private residence Living Arrangements: Children Available Help at Discharge: Family;Available 24 hours/day Type of Home: House Home Access: Stairs to enter [employer] of Steps: 2 Entrance Stairs-Rails: Can reach both Home Layout: Multi-level;Able to live on main level with bedroom/bathroom     Bathroom Shower/Tub: Occupational psychologist: Standard     Home Equipment: Cane - single point;Shower seat          Prior Functioning/Environment Level of Independence: Independent        Comments: drives, cooks, cleans, gardens        OT Problem List: Decreased strength;Decreased activity tolerance;Impaired balance (sitting and/or standing);Decreased safety awareness;Impaired UE functional use      OT Treatment/Interventions: Self-care/ADL training;Therapeutic exercise;Neuromuscular education;Energy conservation;DME and/or AE instruction;Therapeutic activities;Patient/family education;Balance training    OT Goals(Current goals can be found in the care plan section) Acute Rehab OT Goals Patient  Stated Goal: get back to living independently OT Goal Formulation: With patient Time For Goal Achievement: 10/15/19 Potential to Achieve Goals: Good ADL Goals Pt Will Perform Grooming: with modified independence;standing Pt Will Perform Lower Body Dressing: sit to/from stand;with min guard assist Pt Will Transfer to Toilet: with supervision;ambulating;bedside commode Pt Will Perform Toileting - Clothing Manipulation and hygiene: with min guard assist;sitting/lateral leans;sit to/from stand Pt/caregiver will Perform Home Exercise Program: Increased ROM;Both right and left upper extremity;With Supervision Additional ADL Goal #1: Pt will increase x10 mins for OOB ADL with set-upA in standing  OT Frequency: Min 2X/week   Barriers to D/C:            Co-evaluation              AM-PAC OT "6 Clicks" Daily Activity     Outcome Measure Help from another person eating meals?: None Help from another person taking care of personal grooming?: A Little Help from another person toileting, which includes using toliet, bedpan, or urinal?: A Little Help from another person bathing (including washing, rinsing, drying)?: A Little Help from another person to put on and taking off regular upper body clothing?: A Little Help from another person to put on and taking off regular lower body clothing?: A Lot 6 Click Score: 18   End of Session Equipment Utilized During Treatment: Gait belt;Rolling walker Nurse Communication: Mobility status  Activity Tolerance: Patient tolerated treatment well Patient left: in chair;with call bell/phone within reach;with chair alarm set  OT Visit Diagnosis: Unsteadiness on feet (R26.81);Muscle weakness (generalized) (M62.81)  Time: 0539-7673 OT Time Calculation (min): 37 min Charges:  OT General Charges $OT Visit: 1 Visit OT Evaluation $OT Eval Moderate Complexity: 1 Mod OT Treatments $Self Care/Home Management : 8-22 mins  Darryl Nestle)  Marsa Aris OTR/L Acute Rehabilitation Services Pager: [phone] Office: [place] 10/01/2019, 4:09 PM

## 2019-10-02 LAB — CBC
HCT: 26.5 % — ABNORMAL LOW (ref 36.0–46.0)
Hemoglobin: 8.8 g/dL — ABNORMAL LOW (ref 12.0–15.0)
MCH: 34 pg (ref 26.0–34.0)
MCHC: 33.2 g/dL (ref 30.0–36.0)
MCV: 102.3 fL — ABNORMAL HIGH (ref 80.0–100.0)
Platelets: 261 10*3/uL (ref 150–400)
RBC: 2.59 MIL/uL — ABNORMAL LOW (ref 3.87–5.11)
RDW: 16 % — ABNORMAL HIGH (ref 11.5–15.5)
WBC: 11.1 10*3/uL — ABNORMAL HIGH (ref 4.0–10.5)
nRBC: 0 % (ref 0.0–0.2)

## 2019-10-02 LAB — RENAL FUNCTION PANEL
Albumin: 1.3 g/dL — ABNORMAL LOW (ref 3.5–5.0)
Anion gap: 10 (ref 5–15)
BUN: 42 mg/dL — ABNORMAL HIGH (ref 8–23)
CO2: 28 mmol/L (ref 22–32)
Calcium: 7.6 mg/dL — ABNORMAL LOW (ref 8.9–10.3)
Chloride: 98 mmol/L (ref 98–111)
Creatinine, Ser: 3.96 mg/dL — ABNORMAL HIGH (ref 0.44–1.00)
GFR calc Af Amer: 12 mL/min — ABNORMAL LOW (ref >60)
GFR calc non Af Amer: 10 mL/min — ABNORMAL LOW (ref >60)
Glucose, Bld: 92 mg/dL (ref 70–99)
Phosphorus: 4.2 mg/dL (ref 2.5–4.6)
Potassium: 4.2 mmol/L (ref 3.5–5.1)
Sodium: 136 mmol/L (ref 135–145)

## 2019-10-02 MED ORDER — SODIUM CHLORIDE 0.9% FLUSH
10.0000 mL | INTRAVENOUS | Status: DC | PRN
  Administered 2019-10-02: 10 mL
  Filled 2019-10-02: qty 40

## 2019-10-02 NOTE — Progress Notes (Signed)
Pt asking about glasses, pt has 2 bags from her txr from [facility]  I found her lower dentures which she did not want at this time, upper in place, no glasses found in either bag, called [facility] and found dark glasses, will tube and see if they are hers

## 2019-10-02 NOTE — Evaluation (Signed)
Clinical/Bedside Swallow Evaluation Patient Details  Name: Sophia Mcmahon MRN: [id number] Date of Birth: 08-14-1941  Today's Date: 10/02/2019 Time: SLP Start Time (ACUTE ONLY): 0850 SLP Stop Time (ACUTE ONLY): 0904 SLP Time Calculation (min) (ACUTE ONLY): 14 min  Past Medical History:  Past Medical History:  Diagnosis Date  . CHF (congestive heart failure) ([place])   . Coronary artery disease   . Dyspnea   . High cholesterol   . Hypertension   . Hypothyroidism   . Renal disorder   . Seizures ([place])   . Sleep apnea    Past Surgical History:  Past Surgical History:  Procedure Laterality Date  . ABDOMINAL HYSTERECTOMY     HPI:  78 year old female admitted 9/30 to [facility] with 30lb weight gain in 6 weeks & progressive renal failure, tx to [facility] on 10/2 in the setting of progressive cardiorenal syndrome, hypotension, and worsening acute on chronic renal failure.The patient required dopamine infusion in the ICU. She had a HD catheter placed, and she underwent CRRT until the catheter clotted off. As her FENa was low and consistent with cardiorenal syndrome or acute glomerulonephropathy urine immunofixation and UPEP were performed. Results are consistent with multiple myeloma.   Hematology Oncology was consulted on 09/28/2019.Bone scan was performed on 09/28/2019. I have discussed this with the patient's daughter. She stated that she will convey this to oncology today.  Chest xray was showing stable opacification at the bases from atelectasis/PNA and small pleural effusions.     Assessment / Plan / Recommendation Clinical Impression  Clinical swallowing evaluation was completed using thin liquids via cup and straw, pureed material and dry solids.  The patient did not endorse any issues swallowing prior to admission but did state she had a history of GERD.  Cranial nerve exam was completed and unremarkable.  Lingual, labial, facial and jaw range of motion and strength were  adequate.  Facial sensation appeared to be intact.  Her oral and pharyngeal swallow appeared to be functional.  Mastication of dry solids was slow with no oral residue seen post swallow.  It was most likely related to her lack of dentition.  Swallow trigger was appreciated to palpation and overt s/s of aspiration were not seen.  In addition, she passed the [facility] Swallowing Protocol suggesting the low likelihood for aspiration.  Recommend a regular diet with thin liquids.  ST will follow briefly due to most current chest imaging reporting opacification at the bases from atlectasis/PNA.   SLP Visit Diagnosis: Dysphagia, unspecified (R13.10)    Aspiration Risk  No limitations    Diet Recommendation   Regular with thin liquids  Medication Administration: Whole meds with liquid    Other  Recommendations Oral Care Recommendations: Oral care BID   Follow up Recommendations None      Frequency and Duration min 1 x/week  2 weeks       Prognosis Prognosis for Safe Diet Advancement: Good      Swallow Study   General Date of Onset: 09/21/19 HPI: 78 year old female admitted 9/30 to [facility] with 30lb weight gain in 6 weeks & progressive renal failure, tx to [facility] on 10/2 in the setting of progressive cardiorenal syndrome, hypotension, and worsening acute on chronic renal failure.The patient required dopamine infusion in the ICU. She had a HD catheter placed, and she underwent CRRT until the catheter clotted off. As her FENa was low and consistent with cardiorenal syndrome or acute glomerulonephropathy urine immunofixation and UPEP were performed. Results  are consistent with multiple myeloma.   Hematology Oncology was consulted on 09/28/2019.Bone scan was performed on 09/28/2019. I have discussed this with the patient's daughter. She stated that she will convey this to oncology today.  Chest xray was showing stable opacification at the bases from atelectasis/PNA and small pleural  effusions.   Type of Study: Bedside Swallow Evaluation Previous Swallow Assessment: None noted at [facility]. Diet Prior to this Study: Dysphagia 1 (puree);Thin liquids Temperature Spikes Noted: No History of Recent Intubation: No Behavior/Cognition: Alert;Cooperative;Pleasant mood Oral Cavity Assessment: Within Functional Limits Oral Care Completed by SLP: No Oral Cavity - Dentition: Edentulous Vision: Functional for self-feeding Self-Feeding Abilities: Able to feed self Patient Positioning: Upright in bed Baseline Vocal Quality: Normal Volitional Swallow: Able to elicit    Oral/Motor/Sensory Function Overall Oral Motor/Sensory Function: Within functional limits   Ice Chips Ice chips: Not tested   Thin Liquid Thin Liquid: Within functional limits Presentation: Cup;Straw;Self Fed    Nectar Thick Nectar Thick Liquid: Not tested   Honey Thick Honey Thick Liquid: Not tested   Puree Puree: Within functional limits Presentation: Spoon;Self Fed   Solid     Solid: Within functional limits Presentation: [place], [place], [place] Acute Rehab SLP [phone]  Lamar Sprinkles 10/02/2019,9:14 AM

## 2019-10-02 NOTE — Progress Notes (Signed)
Patient ID: Sophia Mcmahon, female   DOB: [birth year], 78 y.o.   MRN: [id number]  [facility] Progress Note   Assessment/ Plan:   1. Acute kidney Injury on chronic kidney disease (baseline creatinine/renal function unclear though at one time reported as CKD IV- followed by nephrologist pre admit ): Acute kidney injury likely from acute cardiorenal syndrome versus ischemic ATN- was also on ARB.  Developed anuria/oliguria with worsening azotemia and hyperkalemia.  started CRRT on 10/3 early AM - stopped on 10/6 - had intermittent tx on Wed, and  Thursday.  Have held HD since but seeing slight rise in BUN and crt daily- no indications today - still need to watch   2.  Acute exacerbation of congestive heart failure: Started on renal replacement therapy for volume unloading.  Admitted with volume overload/refractory to outpatient diuretic therapy.   on midodrine.  using lasix since having UOP- managing to achieve a negative fluid balance-- she tells me she stays swollen, is already down over 25 pounds from admit-  Is on room air - cont lasix 80 q 12-  Will not get all of her volume off quickly   3. Hypotension  - continue midodrine -  10 mg TID for now  5.  Anemia - acute : Likely from chronic illness including chronic kidney disease and dilution and volume excess.  Status post IV iron and continue ESA.  hgb stable  6. Leukocytosis Cultures neg to date- on zosyn and vanc- improving   7. Proteinuria    - no concurrent hematuria.  GN work-up really negative  UPEP with M spike    8. Abnormal free light chain ratio - concern for plasma cell dyscrasia   - note free light chain ratio low at 0.07 - Urine with m spike  - hem/onc has been consulted - bone marrow biopsy done- diagnosis not yet clear   Subjective:    Had 1500 of UOP with lasix, fluid status negative overall- BUN and crt a little higher again -  Looks good, not uremic.  Moved to floor bed       Objective:   BP 123/62 (BP  Location: Right Arm)   Pulse 86   Temp 98.4 F (36.9 C) (Oral)   Resp 18   Ht 5' 8"  (1.727 m)   Wt 102.3 kg   SpO2 93%   BMI 34.29 kg/m   Intake/Output Summary (Last 24 hours) at 10/02/2019 0948 Last data filed at 10/02/2019 0800 Gross per 24 hour  Intake 560.73 ml  Output 1550 ml  Net -989.27 ml   Weight change:   Physical Exam: Gen: Resting comfortably in bed  CVS: Pulse regular, S1 and S2 normal Resp: Anteriorly clear to auscultation, no distinct rales or rhonchi Abd: Soft, obese, nontender Ext: 2+ edema extremities bilaterally - improving  Neuro - alert and oriented x 3 follows commands Psych - normal mood and affect  GU - foley in place Access: RIJ nontunneled dialysis catheter placed 10/3  Imaging: No results found.  Labs: BMET Recent Labs  Lab 09/27/19 0355 09/27/19 1712 09/28/19 0416 09/28/19 1708 09/29/19 0314 09/30/19 0756 10/01/19 0258 10/02/19 0351  NA 135 134* 135 133* 136 135 137 136  K 4.1 4.3 4.4 3.9 4.3 4.2 4.3 4.2  CL 103 103 103 100 102 101 99 98  CO2 26 24 25 26 27 26 27 28   GLUCOSE 90 118* 99 127* 102* 110* 98 92  BUN 17 20 22 14 17  28* 35* 42*  CREATININE 1.84* 2.28* 2.61* 2.02* 2.49* 3.38* 3.58* 3.96*  CALCIUM 6.5* 7.1* 7.2* 7.5* 7.4* 7.5* 7.5* 7.6*  PHOS 2.8 2.1* 2.5 2.4*  --  3.5 3.8 4.2   CBC Recent Labs  Lab 09/28/19 0416 09/29/19 0314 10/01/19 0258 10/02/19 0351  WBC 17.0* 25.4* 12.1* 11.1*  NEUTROABS  --  18.9*  --   --   HGB 8.6* 8.9* 8.8* 8.8*  HCT 26.7* 26.7* 26.7* 26.5*  MCV 101.9* 101.9* 102.7* 102.3*  PLT 151 141* 214 261    Medications:    . Chlorhexidine Gluconate Cloth  6 each Topical Daily  . Chlorhexidine Gluconate Cloth  6 each Topical Q0600  . Chlorhexidine Gluconate Cloth  6 each Topical Q0600  . darbepoetin (ARANESP) injection - NON-DIALYSIS  60 mcg Subcutaneous Q Sun-1800  . docusate sodium  100 mg Oral Daily  . feeding supplement (ENSURE ENLIVE)  237 mL Oral BID BM  . feeding supplement  (PRO-STAT SUGAR FREE 64)  30 mL Oral BID  . furosemide  80 mg Intravenous Q12H  . heparin  5,000 Units Subcutaneous Q8H  . levothyroxine  25 mcg Oral Q0600  . midodrine  10 mg Oral TID WC  . pantoprazole  40 mg Oral Daily  . phenytoin  300 mg Oral QHS  . sodium chloride flush  3 mL Intravenous Q12H   Sophia Mcmahon A Sophia Mcmahon 10/02/2019, 9:48 AM

## 2019-10-02 NOTE — Progress Notes (Signed)
[facility] PROGRESS NOTE  Sophia Mcmahon VEL:[id number] DOB: October 01, 1941 DOA: 09/21/2019 PCP: Frances Maywood, FNP  Brief summary   78 year old female admitted 9/30 to [facility] with 30lb weight gain in 6 weeks & progressive renal failure, tx to [facility] on 10/2 in the setting of progressive cardiorenal syndrome, hypotension, and worsening acute on chronic renal failure.The patient required dopamine infusion in the ICU. She had a HD catheter placed, and she underwent CRRT until the catheter clotted off. As her FENa was low and consistent with cardiorenal syndrome or acute glomerulonephropathy. Urine immunofixation and UPEP were performed. Results are consistent with multiple myeloma.  Hematology Oncology was consulted on 09/28/2019.Bone scan was performed on 09/28/2019.  Interim History: -BM biopsy was performed by Dr. Levan Hurst of IR 09/30/19 -narrow down antibiotics of vancomycin and Zosyn Ancef   Assessment/Plan:  Acute renal failure superimposed on chronic kidney disease -IV: Baseline creatinine/renal function unclear though at one time reported as CKD IV- followed by nephrologist pre admit): Acute kidney injury likely from acute cardiorenal syndrome versus ischemic ATN- was also on ARB prior to admit.found to have MM.Developedanuria/oliguria with worsening azotemia and hyperkalemia. started CRRT on 10/3 early AM - stopped on 10/6. Renal was consulted.  Dr. Moshe Cipro. Holding dialysis and continue diuresis with IV lasix 80 mg per Dr. Moshe Cipro. Creatinine is 3.9 today. Cont per nephrology.   Acute on Chronic diastolic HF: With transudative bilateralpleural effusions and pulmonary edema. I liter removed in thoracentesis. Monitor volume status. The patient is stable on room air. Echocardiogram performed on 10/1/2020demonstrated EF of 25-85% and diastolic dysfunction. This study strongly suggests that the patient's volume overload is not cardiogenic in nature. -on  lasix 80 mg bid  Cellulitis of the right arm: Right arm was swollen and was found to be negative for DVT by ultrasound on 10/7. Pt has been on vancomycin and Zosyn.  No fever.  Leukocytosis improved. blood cultures have been obtained, so far no grow. Narrowed down antibiotics to Ancef by IV. Elevate right arm.    Hypotension: Blood pressures are improved with midodrine 10 mg tid. - Monitor. BP  Light Chain Disease: Concern for multiple myeloma. Hematology/Oncology has been consulted. Appreciate their assistance. - BM biopsy was performed by Dr. Levan Hurst of IR 09/30/19  Hx hypertension: The patient had been on multiple antihypertensives at home. Suspect this is due to toxicity from accumulation d/t renal failure. Antihypertensives are being held due to the patient's hypotension since admission. Goal MAP remains >65.  Acute respiratory failure 2/2 Pulmonary edema and Bilateral Pleural Effusions: has CRRT Hx OSA on home CPAP.  -on Lasix as above   Mild Macrocytic anemia: Hgb 9.5 on 09/25/19 -->8.9 -->8.8. likely chronic due to progressive CKD: There is no evidence of bleeding.  -Monitor and transfuse for hemoglobin less than 7.  Elevated LFTs: Resolved  Hypothyroidism: TSH minimally elevated at 6.8. Continue Synthroid as at home. Likely euthyroid sick syndrome.  H/O seizure: Continue phenytoin  H/O GERD: Continue PPI.  Severe Malnutrition: I appreciate nutrition's assistance. Continue prostat and ensure.  Deconditioning: The patient will likely require SNF or rehab at discharge.PT/OT ordered.  Code Status: full Family Communication: d/w patient, RN (indicate person spoken with, relationship, and if by phone, the number) Disposition Plan: needs snf. After completion of iv diuresis. Needs plan for HD planning in future    Consultants:  PCCM  Nephrology  Hematology/Oncology  Procedures:  CRRT  HD  Thoracentesis  Bone marrow  biopsy  Antibiotics: Anti-infectives (From admission,  onward)   Start     Dose/Rate Route Frequency Ordered Stop   10/01/19 1300  ceFAZolin (ANCEF) IVPB 1 g/50 mL premix     1 g 100 mL/hr over 30 Minutes Intravenous Every 12 hours 10/01/19 1150     09/30/19 0100  piperacillin-tazobactam (ZOSYN) IVPB 3.375 g  Status:  Discontinued     3.375 g 12.5 mL/hr over 240 Minutes Intravenous Every 12 hours 09/29/19 1130 10/01/19 1127   09/29/19 1130  vancomycin variable dose per unstable renal function (pharmacist dosing)  Status:  Discontinued      Does not apply See admin instructions 09/29/19 1130 10/01/19 1150   09/29/19 1130  piperacillin-tazobactam (ZOSYN) IVPB 3.375 g     3.375 g 100 mL/hr over 30 Minutes Intravenous  Once 09/29/19 1115 09/29/19 2015   09/29/19 1130  vancomycin (VANCOCIN) 2,000 mg in sodium chloride 0.9 % 500 mL IVPB     2,000 mg 250 mL/hr over 120 Minutes Intravenous  Once 09/29/19 1115 09/29/19 2015        (indicate start date, and stop date if known)  HPI/Subjective: Reports feeling better. No acute SOB> diuresing well with iv lasix. No chest pains. No fevers.   Objective: Vitals:   10/02/19 0521 10/02/19 0847  BP: (!) 116/52 123/62  Pulse: 81 86  Resp: 16 18  Temp: 98.5 F (36.9 C) 98.4 F (36.9 C)  SpO2: 93% 93%    Intake/Output Summary (Last 24 hours) at 10/02/2019 0847 Last data filed at 10/02/2019 0600 Gross per 24 hour  Intake 460.73 ml  Output 1550 ml  Net -1089.27 ml   Filed Weights   09/29/19 1612 09/30/19 0350 10/01/19 0320  Weight: 103.6 kg 104.9 kg 102.3 kg    Exam:   General:  No acute distress   Cardiovascular: s1,s2 rrr  Respiratory: few crackles at bases   Abdomen: soft, nt   Musculoskeletal: + mild edema    Data Reviewed: Basic Metabolic Panel: Recent Labs  Lab 09/26/19 0420  09/27/19 0355  09/28/19 0416 09/28/19 1708 09/29/19 0314 09/30/19 0756 10/01/19 0258 10/02/19 0351  NA 139   < > 135   < > 135 133*  136 135 137 136  K 4.4   < > 4.1   < > 4.4 3.9 4.3 4.2 4.3 4.2  CL 102   < > 103   < > 103 100 102 101 99 98  CO2 25   < > 26   < > 25 26 27 26 27 28   GLUCOSE 84   < > 90   < > 99 127* 102* 110* 98 92  BUN 26*   < > 17   < > 22 14 17  28* 35* 42*  CREATININE 2.24*   < > 1.84*   < > 2.61* 2.02* 2.49* 3.38* 3.58* 3.96*  CALCIUM 6.8*   < > 6.5*   < > 7.2* 7.5* 7.4* 7.5* 7.5* 7.6*  MG 2.3  --  2.0  --  2.0  --   --   --   --   --   PHOS 2.6   < > 2.8   < > 2.5 2.4*  --  3.5 3.8 4.2   < > = values in this interval not displayed.   Liver Function Tests: Recent Labs  Lab 09/26/19 0420  09/27/19 0355 09/27/19 1712 09/28/19 0416 09/28/19 1708 09/30/19 0756 10/01/19 0258 10/02/19 0351  AST 52*  --  61*  --   --   --   --   --   --  ALT 33  --  36  --   --   --   --   --   --   ALKPHOS 145*  --  149*  --   --   --   --   --   --   BILITOT 0.2*  --  0.3  --   --   --   --   --   --   PROT 4.3*  --  3.9* 3.9*  --   --   --   --   --   ALBUMIN 1.7*  1.6*   < > 1.4* 1.4* 1.4* 1.4* 1.4* 1.3* 1.3*   < > = values in this interval not displayed.   Recent Labs  Lab 10/01/19 1810  LIPASE 54*   No results for input(s): AMMONIA in the last 168 hours. CBC: Recent Labs  Lab 09/27/19 0355 09/28/19 0416 09/29/19 0314 10/01/19 0258 10/02/19 0351  WBC 9.9 17.0* 25.4* 12.1* 11.1*  NEUTROABS  --   --  18.9*  --   --   HGB 8.8* 8.6* 8.9* 8.8* 8.8*  HCT 26.2* 26.7* 26.7* 26.7* 26.5*  MCV 100.4* 101.9* 101.9* 102.7* 102.3*  PLT 144* 151 141* 214 261   Cardiac Enzymes: No results for input(s): CKTOTAL, CKMB, CKMBINDEX, TROPONINI in the last 168 hours. BNP (last 3 results) Recent Labs    09/22/19 0121  BNP 1,148.0*    ProBNP (last 3 results) No results for input(s): PROBNP in the last 8760 hours.  CBG: No results for input(s): GLUCAP in the last 168 hours.  Recent Results (from the past 240 hour(s))  MRSA PCR Screening     Status: None   Collection Time: 09/23/19  6:38 AM    Specimen: Nasal Mucosa; Nasopharyngeal  Result Value Ref Range Status   MRSA by PCR NEGATIVE NEGATIVE Final    Comment:        The GeneXpert MRSA Assay (FDA approved for NASAL specimens only), is one component of a comprehensive MRSA colonization surveillance program. It is not intended to diagnose MRSA infection nor to guide or monitor treatment for MRSA infections. Performed at [facility], [address] [place], [place] [address]   Body fluid culture     Status: None   Collection Time: 09/27/19  2:09 PM   Specimen: Body Fluid  Result Value Ref Range Status   Specimen Description FLUID RIGHT PLEURAL  Final   Special Requests NONE  Final   Gram Stain NO WBC SEEN NO ORGANISMS SEEN   Final   Culture   Final    NO GROWTH 3 DAYS Performed at [facility], [address], [place], [place] [address]    Report Status 10/01/2019 FINAL  Final  Culture, blood (routine x 2)     Status: None (Preliminary result)   Collection Time: 09/29/19  8:00 AM   Specimen: BLOOD LEFT ARM  Result Value Ref Range Status   Specimen Description BLOOD LEFT ARM  Final   Special Requests   Final    BOTTLES DRAWN AEROBIC AND ANAEROBIC Blood Culture adequate volume   Culture   Final    NO GROWTH 2 DAYS Performed at [facility], [place] [address], [place], [place] [address]    Report Status PENDING  Incomplete  Culture, blood (routine x 2)     Status: None (Preliminary result)   Collection Time: 09/29/19  8:20 AM   Specimen: BLOOD  Result Value Ref Range Status  Specimen Description BLOOD LEFT ANTECUBITAL  Final   Special Requests   Final    BOTTLES DRAWN AEROBIC AND ANAEROBIC Blood Culture adequate volume   Culture   Final    NO GROWTH 2 DAYS Performed at [facility], [address], [place], [place] [address]    Report Status PENDING  Incomplete  Culture, Urine     Status: Abnormal   Collection Time: 09/29/19  8:09 PM   Specimen: Urine, Random  Result Value Ref Range  Status   Specimen Description URINE, RANDOM  Final   Special Requests   Final    NONE Performed at [facility], [place] [address], [place], [place] [address]    Culture >=100,000 COLONIES/mL STAPHYLOCOCCUS LUGDUNENSIS (A)  Final   Report Status 10/01/2019 FINAL  Final   Organism ID, Bacteria STAPHYLOCOCCUS LUGDUNENSIS (A)  Final      Susceptibility   Staphylococcus lugdunensis - MIC*    CIPROFLOXACIN <=0.5 SENSITIVE Sensitive     GENTAMICIN <=0.5 SENSITIVE Sensitive     NITROFURANTOIN <=16 SENSITIVE Sensitive     OXACILLIN >=4 RESISTANT Resistant     TETRACYCLINE <=1 SENSITIVE Sensitive     VANCOMYCIN <=0.5 SENSITIVE Sensitive     TRIMETH/SULFA <=10 SENSITIVE Sensitive     CLINDAMYCIN >=8 RESISTANT Resistant     RIFAMPIN <=0.5 SENSITIVE Sensitive     Inducible Clindamycin NEGATIVE Sensitive     * >=100,000 COLONIES/mL STAPHYLOCOCCUS LUGDUNENSIS     Studies: No results found.  Scheduled Meds: . Chlorhexidine Gluconate Cloth  6 each Topical Daily  . Chlorhexidine Gluconate Cloth  6 each Topical Q0600  . Chlorhexidine Gluconate Cloth  6 each Topical Q0600  . darbepoetin (ARANESP) injection - NON-DIALYSIS  60 mcg Subcutaneous Q Sun-1800  . docusate sodium  100 mg Oral Daily  . feeding supplement (ENSURE ENLIVE)  237 mL Oral BID BM  . feeding supplement (PRO-STAT SUGAR FREE 64)  30 mL Oral BID  . furosemide  80 mg Intravenous Q12H  . heparin  5,000 Units Subcutaneous Q8H  . levothyroxine  25 mcg Oral Q0600  . midodrine  10 mg Oral TID WC  . pantoprazole  40 mg Oral Daily  . phenytoin  300 mg Oral QHS  . sodium chloride flush  3 mL Intravenous Q12H   Continuous Infusions: . sodium chloride Stopped (10/01/19 1448)  .  ceFAZolin (ANCEF) IV Stopped (10/02/19 0045)    Principal Problem:   Acute renal failure superimposed on chronic kidney disease ([place]) Active Problems:   Protein-calorie malnutrition, severe (HCC)   Abnormal liver function   Anemia   Anasarca   Acute  on chronic diastolic CHF (congestive heart failure) (HCC)   Cardiorenal syndrome   Acute renal failure (ARF) (HCC)   Pleural effusion   Leukocytosis   Multiple myeloma (HCC)   Acute on chronic congestive heart failure ([place])    Time spent: >25 minutes     Kinnie Feil  [facility] Pager [phone]. If 7PM-7AM, please contact night-coverage at [email or web address], password [facility] 10/02/2019, 8:47 AM  LOS: 10 days

## 2019-10-02 NOTE — Progress Notes (Signed)
Pt refuses cpap at this time 

## 2019-10-02 NOTE — Progress Notes (Signed)
Pt transferred from 70M S/P Acute Rena Failure, pt alert and oriented, denies any pain at this time, pt settled in bed with call light within pt's reach, tele monitor put and verified on pt, was however reassured and will continue to monitor, safety concern explained and initiated as well, v/s stable. [place], [place] Rallo Efe

## 2019-10-03 ENCOUNTER — Inpatient Hospital Stay (HOSPITAL_COMMUNITY): Payer: Medicare Other

## 2019-10-03 LAB — RENAL FUNCTION PANEL
Albumin: 1.3 g/dL — ABNORMAL LOW (ref 3.5–5.0)
Anion gap: 12 (ref 5–15)
BUN: 48 mg/dL — ABNORMAL HIGH (ref 8–23)
CO2: 27 mmol/L (ref 22–32)
Calcium: 7.6 mg/dL — ABNORMAL LOW (ref 8.9–10.3)
Chloride: 98 mmol/L (ref 98–111)
Creatinine, Ser: 4.46 mg/dL — ABNORMAL HIGH (ref 0.44–1.00)
GFR calc Af Amer: 10 mL/min — ABNORMAL LOW (ref >60)
GFR calc non Af Amer: 9 mL/min — ABNORMAL LOW (ref >60)
Glucose, Bld: 96 mg/dL (ref 70–99)
Phosphorus: 4.6 mg/dL (ref 2.5–4.6)
Potassium: 4 mmol/L (ref 3.5–5.1)
Sodium: 137 mmol/L (ref 135–145)

## 2019-10-03 LAB — CBC
HCT: 26.9 % — ABNORMAL LOW (ref 36.0–46.0)
Hemoglobin: 8.6 g/dL — ABNORMAL LOW (ref 12.0–15.0)
MCH: 33.3 pg (ref 26.0–34.0)
MCHC: 32 g/dL (ref 30.0–36.0)
MCV: 104.3 fL — ABNORMAL HIGH (ref 80.0–100.0)
Platelets: 304 10*3/uL (ref 150–400)
RBC: 2.58 MIL/uL — ABNORMAL LOW (ref 3.87–5.11)
RDW: 16.2 % — ABNORMAL HIGH (ref 11.5–15.5)
WBC: 11.2 10*3/uL — ABNORMAL HIGH (ref 4.0–10.5)
nRBC: 0.2 % (ref 0.0–0.2)

## 2019-10-03 NOTE — Care Management Important Message (Signed)
Important Message  Patient Details  Name: Sophia Mcmahon MRN: [id number] Date of Birth: [birth year]   Medicare Important Message Given:  Yes     [name] 10/03/2019, 2:15 PM

## 2019-10-03 NOTE — TOC Initial Note (Signed)
Transition of Care [facility]) - Initial/Assessment Note    Patient Details  Name: Sophia Mcmahon MRN: [id number] Date of Birth: 1941/09/15  [facility]) CM/SW Contact:    Gelene Mink, [place] Phone Number: 10/03/2019, 4:53 PM  Clinical Narrative:                  CSW called [facility] in [place], [place]. [facility] was able to accept the patient. CSW called and spoke with Horris Latino. They are able to provide PT, OT, and Speech. Horris Latino stated that speech maybe delayed a few days. CSW shared that the patient is only needing it once per week for no more than 2 weeks. She stated that she will review her clinicals and run her insurance. She stated to please call her once the patient is close to being ready for discharge. [facility] P: [phone]. CSW faxed referral to [phone]. [facility] requested referral be sent and they will review and let CSW know if they are able to take.   CSW called and spoke with the patient. CSW informed her that [facility] was able to accept the patient. CSW asked if she needed any DME, she stated that she could use the 3-in-1. The patient stated that she has a rolling walker at home.   CSW called Zack with [employer] and left a message requesting 3-in-1. CSW is awaiting a return phone call. Patient will need home health orders.    Expected Discharge Plan: [place] Barriers to Discharge: Continued Medical Work up   Patient Goals and CMS Choice Patient states their goals for this hospitalization and ongoing recovery are:: Pt would like to go home CMS Medicare.gov Compare Post Acute Care list provided to:: Patient Choice offered to / list presented to : Patient  Expected Discharge Plan and Services Expected Discharge Plan: [place] In-house Referral: Clinical Social Work Discharge Planning Services: NA Post Acute Care Choice: [place] arrangements for the past 2 months: [place] Expected Discharge  Date: 09/25/19               DME Arranged: 3-N-1 DME Agency: [facility] Date DME Agency Contacted: 10/03/19 Time DME Agency Contacted: [phone] Representative spoke with at DME Agency: [place] left VM with Zach asking for DME HH Arranged: Speech Therapy, PT, OT HH Agency: [facility] Date [place]: 10/03/19 Time [place]: 1652 Representative spoke with at [place]: [place] Arrangements/Services Living arrangements for the past 2 months: [place] with:: Self Patient language and need for interpreter reviewed:: No Do you feel safe going back to the place where you live?: Yes      Need for Family Participation in Patient Care: No (Comment) Care giver support system in place?: No (comment) Current home services: DME Criminal Activity/Legal Involvement Pertinent to Current Situation/Hospitalization: No - Comment as needed  Activities of Daily Living Home Assistive Devices/Equipment: CPAP, Blood pressure cuff ADL Screening (condition at time of admission) Patient's cognitive ability adequate to safely complete daily activities?: Yes Is the patient deaf or have difficulty hearing?: No Does the patient have difficulty seeing, even when wearing glasses/contacts?: No Does the patient have difficulty concentrating, remembering, or making decisions?: No Patient able to express need for assistance with ADLs?: Yes Does the patient have difficulty dressing or bathing?: No Independently performs ADLs?: Yes (appropriate for developmental age) Does the patient have difficulty walking or climbing stairs?: No Weakness of Legs: None Weakness of Arms/Hands:  None  Permission Sought/Granted Permission sought to share information with : Case Manager Permission granted to share information with : Yes, Verbal Permission Granted     Permission granted to share info w AGENCY: [place] granted to share info w Relationship: daughter      Emotional Assessment Appearance:: Appears stated age Attitude/Demeanor/Rapport: Engaged Affect (typically observed): Calm Orientation: : Oriented to Self, Oriented to Place, Oriented to  Time, Oriented to Situation Alcohol / Substance Use: Not Applicable Psych Involvement: No (comment)  Admission diagnosis:  Anasarca [R60.1] ARF (acute renal failure) ([place]) [N17.9] Abnormal liver function [R94.5] Acute renal failure superimposed on chronic kidney disease, unspecified CKD stage, unspecified acute renal failure type (HCC) [N17.9, N18.9] Acute on chronic congestive heart failure, unspecified heart failure type ([place]) [I50.9] Patient Active Problem List   Diagnosis Date Noted  . Acute on chronic congestive heart failure ([place])   . Leukocytosis   . Multiple myeloma ([place])   . Pleural effusion   . Acute renal failure (ARF) ([place]) 09/24/2019  . Anasarca   . Acute on chronic diastolic CHF (congestive heart failure) ([place])   . Cardiorenal syndrome   . Acute renal failure superimposed on chronic kidney disease ([place]) 09/22/2019  . Protein-calorie malnutrition, severe ([place]) 09/22/2019  . Abnormal liver function 09/22/2019  . Anemia 09/22/2019   PCP:  Frances Maywood, FNP Pharmacy:  No Pharmacies Listed    Social Determinants of Health (SDOH) Interventions    Readmission Risk Interventions No flowsheet data found.

## 2019-10-03 NOTE — Progress Notes (Signed)
[facility] PROGRESS NOTE  Roselynn Whitacre VPX:[id number] DOB: October 04, 1941 DOA: 09/21/2019 PCP: Frances Maywood, FNP  Brief summary   78 year old female admitted 9/30 to [facility] with 30lb weight gain in 6 weeks & progressive renal failure, tx to [facility] on 10/2 in the setting of progressive cardiorenal syndrome, hypotension, and worsening acute on chronic renal failure.The patient required dopamine infusion in the ICU. She had a HD catheter placed, and she underwent CRRT from 09/23/2021 09/27/2019 until the catheter clotted off. As her FENa was low and consistent with cardiorenal syndrome or acute glomerulonephropathy. Urine immunofixation and UPEP were performed. Results are concerning multiple myeloma.  Hematology Oncology was consulted on 09/28/2019.Bone scan was performed on 09/28/2019 which was unremarkable except diffuse osteopenia.  Oncology recommended bone marrow biopsy which was performed by Dr. Levan Hurst of IR on 09/30/19.  She was then diagnosed with right upper extremity cellulitis on 09/29/2019 and started on vancomycin and Zosyn which were de-escalated to IV Ancef on 10/01/2019.  Assessment/Plan:  Acute renal failure superimposed on chronic kidney disease -IV: Baseline creatinine/renal function unclear though at one time reported as CKD IV- followed by nephrologist pre admit): Acute kidney injury likely from acute cardiorenal syndrome versus ischemic ATN- was also on ARB prior to admit.found to have MM.Developedanuria/oliguria with worsening azotemia and hyperkalemia. started CRRT on 10/3 early AM - stopped on 10/6.  Patient is recent hemodialysis was on 09/29/2019.  Hemodialysis on hold since then.  Now his creatinine is jumping and his urine output is dropping.  Nephrology planning on possible dialysis tomorrow if this continues to get worse.  Acute hypoxic respiratory failure secondary to acute on Chronic diastolic HF/bilateral pleural effusion/acute pulmonary edema:  With transudative bilateralpleural effusions and pulmonary edema. I liter removed in thoracentesis. Monitor volume status. The patient is stable on room air. Echocardiogram performed on 10/1/2020demonstrated EF of 46-27% and diastolic dysfunction.  Bilateral pleural effusion could still be due to diastolic congestive heart failure. -on lasix 80 mg bid  Cellulitis of the right arm: Right arm was swollen and was found to be negative for DVT by ultrasound on 10/7.   Patient initially was started on vancomycin and Zosyn on 09/29/2019 and this was switched to IV Ancef on 10/01/2019.  According to patient, her arm is looking much better today, less warm and less painful.  Continue Ancef for now.   Hypotension: Blood pressures are improved with midodrine 10 mg tid. - Monitor. BP  Light Chain Disease: Concern for multiple myeloma. Hematology/Oncology has been consulted. Appreciate their assistance. - BM biopsy was performed by Dr. Levan Hurst of IR 09/30/19.  Results are still pending.  Hx hypertension: The patient had been on multiple antihypertensives at home. Suspect this is due to toxicity from accumulation d/t renal failure. Antihypertensives are being held due to the patient's hypotension since admission. Goal MAP remains >65.  Now her blood pressure has started to get better.  We will watch closely and resume antihypertensives as needed.  Mild Macrocytic anemia: Hgb 9.5 on 09/25/19 -->8.9 -->8.8. likely chronic due to progressive CKD: There is no evidence of bleeding.  Hemoglobin remains a stable. -Monitor and transfuse for hemoglobin less than 7.  Elevated LFTs: Resolved  Hypothyroidism: TSH minimally elevated at 6.8. Continue Synthroid as at home. Likely euthyroid sick syndrome.  H/O seizure: Continue phenytoin  H/O GERD: Continue PPI.  Severe Malnutrition: I appreciate nutrition's assistance. Continue prostat and ensure.  Deconditioning: The patient will likely require SNF or  rehab at discharge.PT/OT ordered.  Code Status: full Family Communication: Discussed with patient.  Daughter wanted update from nephrology.  Per nurse's note, nephrology has been aware of that.  I will also try to call her later today. Disposition Plan: Needs snf medically cleared by nephrology.   Consultants:  PCCM  Nephrology  Hematology/Oncology  Procedures:  CRRT  HD  Thoracentesis  Bone marrow biopsy  Antibiotics: Anti-infectives (From admission, onward)   Start     Dose/Rate Route Frequency Ordered Stop   10/01/19 1300  ceFAZolin (ANCEF) IVPB 1 g/50 mL premix     1 g 100 mL/hr over 30 Minutes Intravenous Every 12 hours 10/01/19 1150     09/30/19 0100  piperacillin-tazobactam (ZOSYN) IVPB 3.375 g  Status:  Discontinued     3.375 g 12.5 mL/hr over 240 Minutes Intravenous Every 12 hours 09/29/19 1130 10/01/19 1127   09/29/19 1130  vancomycin variable dose per unstable renal function (pharmacist dosing)  Status:  Discontinued      Does not apply See admin instructions 09/29/19 1130 10/01/19 1150   09/29/19 1130  piperacillin-tazobactam (ZOSYN) IVPB 3.375 g     3.375 g 100 mL/hr over 30 Minutes Intravenous  Once 09/29/19 1115 09/29/19 2015   09/29/19 1130  vancomycin (VANCOCIN) 2,000 mg in sodium chloride 0.9 % 500 mL IVPB     2,000 mg 250 mL/hr over 120 Minutes Intravenous  Once 09/29/19 1115 09/29/19 2015       (indicate start date, and stop date if known)  HPI/Subjective: Patient seen and examined.  She states that she feels better.  No shortness of breath.  Some right upper extremity pain.  Objective: Vitals:   10/03/19 0442 10/03/19 0853  BP: (!) 116/53 (!) 149/76  Pulse: 81 90  Resp: 18 20  Temp: 98.6 F (37 C) 98.7 F (37.1 C)  SpO2: 95% 95%    Intake/Output Summary (Last 24 hours) at 10/03/2019 1354 Last data filed at 10/03/2019 0950 Gross per 24 hour  Intake 1226.5 ml  Output 350 ml  Net 876.5 ml   Filed Weights   09/30/19 0350 10/01/19  0320 10/03/19 0614  Weight: 104.9 kg 102.3 kg 99.5 kg    Exam:  General exam: Appears calm and comfortable, obese Respiratory system: Fine crackles at the bases bilaterally. Respiratory effort normal. Cardiovascular system: S1 & S2 heard, RRR. No JVD, murmurs, rubs, gallops or clicks.  +1 pitting edema bilateral lower extremity Gastrointestinal system: Abdomen is nondistended, soft and nontender. No organomegaly or masses felt. Normal bowel sounds heard. Central nervous system: Alert and oriented. No focal neurological deficits. Extremities: Symmetric 5 x 5 power.  Right upper extremity slightly edematous than the left, slightly warm and tender to touch.  No open sores. Skin: No rashes, lesions or ulcers.  Psychiatry: Judgement and insight appear normal. Mood & affect appropriate.    Data Reviewed: Basic Metabolic Panel: Recent Labs  Lab 09/27/19 0355  09/28/19 0416 09/28/19 1708 09/29/19 0314 09/30/19 0756 10/01/19 0258 10/02/19 0351 10/03/19 0355  NA 135   < > 135 133* 136 135 137 136 137  K 4.1   < > 4.4 3.9 4.3 4.2 4.3 4.2 4.0  CL 103   < > 103 100 102 101 99 98 98  CO2 26   < > _0 GLUCOSE 90   < > 99 127* 102* 110* 98 92 96  BUN 17   < > _1 28* 35* 42* 48*  CREATININE 1.84*   < >  2.61* 2.02* 2.49* 3.38* 3.58* 3.96* 4.46*  CALCIUM 6.5*   < > 7.2* 7.5* 7.4* 7.5* 7.5* 7.6* 7.6*  MG 2.0  --  2.0  --   --   --   --   --   --   PHOS 2.8   < > 2.5 2.4*  --  3.5 3.8 4.2 4.6   < > = values in this interval not displayed.   Liver Function Tests: Recent Labs  Lab 09/27/19 0355 09/27/19 1712  09/28/19 1708 09/30/19 0756 10/01/19 0258 10/02/19 0351 10/03/19 0355  AST 61*  --   --   --   --   --   --   --   ALT 36  --   --   --   --   --   --   --   ALKPHOS 149*  --   --   --   --   --   --   --   BILITOT 0.3  --   --   --   --   --   --   --   PROT 3.9* 3.9*  --   --   --   --   --   --   ALBUMIN 1.4* 1.4*   < > 1.4* 1.4* 1.3* 1.3* 1.3*   < > =  values in this interval not displayed.   Recent Labs  Lab 10/01/19 1810  LIPASE 54*   No results for input(s): AMMONIA in the last 168 hours. CBC: Recent Labs  Lab 09/28/19 0416 09/29/19 0314 10/01/19 0258 10/02/19 0351 10/03/19 0355  WBC 17.0* 25.4* 12.1* 11.1* 11.2*  NEUTROABS  --  18.9*  --   --   --   HGB 8.6* 8.9* 8.8* 8.8* 8.6*  HCT 26.7* 26.7* 26.7* 26.5* 26.9*  MCV 101.9* 101.9* 102.7* 102.3* 104.3*  PLT 151 141* 214 261 304   Cardiac Enzymes: No results for input(s): CKTOTAL, CKMB, CKMBINDEX, TROPONINI in the last 168 hours. BNP (last 3 results) Recent Labs    09/22/19 0121  BNP 1,148.0*    ProBNP (last 3 results) No results for input(s): PROBNP in the last 8760 hours.  CBG: No results for input(s): GLUCAP in the last 168 hours.  Recent Results (from the past 240 hour(s))  Body fluid culture     Status: None   Collection Time: 09/27/19  2:09 PM   Specimen: Body Fluid  Result Value Ref Range Status   Specimen Description FLUID RIGHT PLEURAL  Final   Special Requests NONE  Final   Gram Stain NO WBC SEEN NO ORGANISMS SEEN   Final   Culture   Final    NO GROWTH 3 DAYS Performed at [facility], [address], [place], [place] [address]    Report Status 10/01/2019 FINAL  Final  Culture, blood (routine x 2)     Status: None (Preliminary result)   Collection Time: 09/29/19  8:00 AM   Specimen: BLOOD LEFT ARM  Result Value Ref Range Status   Specimen Description BLOOD LEFT ARM  Final   Special Requests   Final    BOTTLES DRAWN AEROBIC AND ANAEROBIC Blood Culture adequate volume   Culture   Final    NO GROWTH 4 DAYS Performed at [facility], [place] [address] [place], [place] [address]    Report Status PENDING  Incomplete  Culture, blood (routine x 2)     Status: None (Preliminary result)  Collection Time: 09/29/19  8:20 AM   Specimen: BLOOD  Result Value Ref Range Status   Specimen Description BLOOD LEFT ANTECUBITAL  Final   Special  Requests   Final    BOTTLES DRAWN AEROBIC AND ANAEROBIC Blood Culture adequate volume   Culture   Final    NO GROWTH 4 DAYS Performed at [facility], [place] [address] [place], [place] [address]    Report Status PENDING  Incomplete  Culture, Urine     Status: Abnormal   Collection Time: 09/29/19  8:09 PM   Specimen: Urine, Random  Result Value Ref Range Status   Specimen Description URINE, RANDOM  Final   Special Requests   Final    NONE Performed at [facility], [place] [address] [place],  [address]    Culture >=100,000 COLONIES/mL STAPHYLOCOCCUS LUGDUNENSIS (A)  Final   Report Status 10/01/2019 FINAL  Final   Organism ID, Bacteria STAPHYLOCOCCUS LUGDUNENSIS (A)  Final      Susceptibility   Staphylococcus lugdunensis - MIC*    CIPROFLOXACIN <=0.5 SENSITIVE Sensitive     GENTAMICIN <=0.5 SENSITIVE Sensitive     NITROFURANTOIN <=16 SENSITIVE Sensitive     OXACILLIN >=4 RESISTANT Resistant     TETRACYCLINE <=1 SENSITIVE Sensitive     VANCOMYCIN <=0.5 SENSITIVE Sensitive     TRIMETH/SULFA <=10 SENSITIVE Sensitive     CLINDAMYCIN >=8 RESISTANT Resistant     RIFAMPIN <=0.5 SENSITIVE Sensitive     Inducible Clindamycin NEGATIVE Sensitive     * >=100,000 COLONIES/mL STAPHYLOCOCCUS LUGDUNENSIS     Studies: No results found.  Scheduled Meds: . Chlorhexidine Gluconate Cloth  6 each Topical Daily  . Chlorhexidine Gluconate Cloth  6 each Topical Q0600  . Chlorhexidine Gluconate Cloth  6 each Topical Q0600  . darbepoetin (ARANESP) injection - NON-DIALYSIS  60 mcg Subcutaneous Q Sun-1800  . docusate sodium  100 mg Oral Daily  . feeding supplement (ENSURE ENLIVE)  237 mL Oral BID BM  . feeding supplement (PRO-STAT SUGAR FREE 64)  30 mL Oral BID  . furosemide  80 mg Intravenous Q12H  . heparin  5,000 Units Subcutaneous Q8H  . levothyroxine  25 mcg Oral Q0600  . midodrine  10 mg Oral TID WC  . pantoprazole  40 mg Oral Daily  . phenytoin  300 mg Oral QHS  . sodium  chloride flush  3 mL Intravenous Q12H   Continuous Infusions: . sodium chloride 250 mL (10/02/19 2254)  .  ceFAZolin (ANCEF) IV Stopped (10/03/19 0950)    Principal Problem:   Acute renal failure superimposed on chronic kidney disease ([place]) Active Problems:   Protein-calorie malnutrition, severe (HCC)   Abnormal liver function   Anemia   Anasarca   Acute on chronic diastolic CHF (congestive heart failure) (HCC)   Cardiorenal syndrome   Acute renal failure (ARF) (HCC)   Pleural effusion   Leukocytosis   Multiple myeloma ([place])   Acute on chronic congestive heart failure ([place])  Time spent: 35 minutes  [facility] If 7PM-7AM, please contact night-coverage at [email or web address], password [facility] 10/03/2019, 1:54 PM  LOS: 11 days

## 2019-10-03 NOTE — Progress Notes (Signed)
Pt dtr Magda Paganini called about plan and update . I read plan to do HD pending labs in am. She wanted to speak to MD for more details. I paged Dr Marval Regal per pt request

## 2019-10-03 NOTE — Progress Notes (Signed)
OT Cancellation Note  Patient Details Name: Sophia Mcmahon MRN: [id number] DOB: 20-Jul-1941   Cancelled Treatment:    Reason Eval/Treat Not Completed: Patient at procedure or test/ unavailable. Will follow and see as able.   Delight Stare, OT Acute Rehabilitation Services Pager [phone] Office [phone]   Delight Stare 10/03/2019, 2:13 PM

## 2019-10-03 NOTE — Progress Notes (Signed)
Patient ID: Sophia Mcmahon, female   DOB: 09-22-1941, 78 y.o.   MRN: [id number] S:Feels well, no complaints O:BP (!) 149/76 (BP Location: Right Leg)   Pulse 90   Temp 98.7 F (37.1 C) (Oral)   Resp 20   Ht 5' 8"  (1.727 m)   Wt 99.5 kg   SpO2 95%   BMI 33.36 kg/m   Intake/Output Summary (Last 24 hours) at 10/03/2019 1149 Last data filed at 10/03/2019 0950 Gross per 24 hour  Intake 1276.5 ml  Output 350 ml  Net 926.5 ml   Intake/Output: I/O last 3 completed shifts: In: 1279.5 [P.O.:1080; I.V.:49.5; IV Piggyback:150] Out: 901 [Urine:900; Stool:1]  Intake/Output this shift:  Total I/O In: 410 [P.O.:360; IV Piggyback:50] Out: 150 [Urine:150] Weight change:  Gen: NAD CVS: no rub Resp: cta Abd: +BS, soft, NT Ext: 1+ edema of legs, 2+ edema RUE  Recent Labs  Lab 09/27/19 0355 09/27/19 1712 09/28/19 0416 09/28/19 1708 09/29/19 0314 09/30/19 0756 10/01/19 0258 10/02/19 0351 10/03/19 0355  NA 135 134* 135 133* 136 135 137 136 137  K 4.1 4.3 4.4 3.9 4.3 4.2 4.3 4.2 4.0  CL 103 103 103 100 102 101 99 98 98  CO2 26 24 25 26 27 26 27 28 27   GLUCOSE 90 118* 99 127* 102* 110* 98 92 96  BUN 17 20 22 14 17  28* 35* 42* 48*  CREATININE 1.84* 2.28* 2.61* 2.02* 2.49* 3.38* 3.58* 3.96* 4.46*  ALBUMIN 1.4* 1.4* 1.4* 1.4*  --  1.4* 1.3* 1.3* 1.3*  CALCIUM 6.5* 7.1* 7.2* 7.5* 7.4* 7.5* 7.5* 7.6* 7.6*  PHOS 2.8 2.1* 2.5 2.4*  --  3.5 3.8 4.2 4.6  AST 61*  --   --   --   --   --   --   --   --   ALT 36  --   --   --   --   --   --   --   --    Liver Function Tests: Recent Labs  Lab 09/27/19 0355 09/27/19 1712  10/01/19 0258 10/02/19 0351 10/03/19 0355  AST 61*  --   --   --   --   --   ALT 36  --   --   --   --   --   ALKPHOS 149*  --   --   --   --   --   BILITOT 0.3  --   --   --   --   --   PROT 3.9* 3.9*  --   --   --   --   ALBUMIN 1.4* 1.4*   < > 1.3* 1.3* 1.3*   < > = values in this interval not displayed.   Recent Labs  Lab 10/01/19 1810  LIPASE 54*   No  results for input(s): AMMONIA in the last 168 hours. CBC: Recent Labs  Lab 09/28/19 0416 09/29/19 0314 10/01/19 0258 10/02/19 0351 10/03/19 0355  WBC 17.0* 25.4* 12.1* 11.1* 11.2*  NEUTROABS  --  18.9*  --   --   --   HGB 8.6* 8.9* 8.8* 8.8* 8.6*  HCT 26.7* 26.7* 26.7* 26.5* 26.9*  MCV 101.9* 101.9* 102.7* 102.3* 104.3*  PLT 151 141* 214 261 304   Cardiac Enzymes: No results for input(s): CKTOTAL, CKMB, CKMBINDEX, TROPONINI in the last 168 hours. CBG: No results for input(s): GLUCAP in the last 168 hours.  Iron Studies: No results for input(s): IRON, TIBC, TRANSFERRIN, FERRITIN in  the last 72 hours. Studies/Results: No results found. . Chlorhexidine Gluconate Cloth  6 each Topical Daily  . Chlorhexidine Gluconate Cloth  6 each Topical Q0600  . Chlorhexidine Gluconate Cloth  6 each Topical Q0600  . darbepoetin (ARANESP) injection - NON-DIALYSIS  60 mcg Subcutaneous Q Sun-1800  . docusate sodium  100 mg Oral Daily  . feeding supplement (ENSURE ENLIVE)  237 mL Oral BID BM  . feeding supplement (PRO-STAT SUGAR FREE 64)  30 mL Oral BID  . furosemide  80 mg Intravenous Q12H  . heparin  5,000 Units Subcutaneous Q8H  . levothyroxine  25 mcg Oral Q0600  . midodrine  10 mg Oral TID WC  . pantoprazole  40 mg Oral Daily  . phenytoin  300 mg Oral QHS  . sodium chloride flush  3 mL Intravenous Q12H    BMET    Component Value Date/Time   NA 137 10/03/2019 0355   K 4.0 10/03/2019 0355   CL 98 10/03/2019 0355   CO2 27 10/03/2019 0355   GLUCOSE 96 10/03/2019 0355   BUN 48 (H) 10/03/2019 0355   CREATININE 4.46 (H) 10/03/2019 0355   CALCIUM 7.6 (L) 10/03/2019 0355   GFRNONAA 9 (L) 10/03/2019 0355   GFRAA 10 (L) 10/03/2019 0355   CBC    Component Value Date/Time   WBC 11.2 (H) 10/03/2019 0355   RBC 2.58 (L) 10/03/2019 0355   HGB 8.6 (L) 10/03/2019 0355   HCT 26.9 (L) 10/03/2019 0355   HCT 29.7 (L) 09/22/2019 0422   PLT 304 10/03/2019 0355   MCV 104.3 (H) 10/03/2019 0355    MCH 33.3 10/03/2019 0355   MCHC 32.0 10/03/2019 0355   RDW 16.2 (H) 10/03/2019 0355   LYMPHSABS 3.1 09/29/2019 0314   MONOABS 2.9 (H) 09/29/2019 0314   EOSABS 0.2 09/29/2019 0314   BASOSABS 0.1 09/29/2019 0314     Assessment/Plan:  1. AKI/CKD stage 4 in setting of cardiorenal syndrome vs ischemic ATN.  She developed oliguria and worsening azotemia and hyperkalemia with hypotension and started on CVVHD on 09/24/19 and had intermittent HD on 09/27/19 and 09/29/19 with some improvement of UOP but climbing BUN/Cr.  Will likely require HD tomorrow if BUN/Cr cont to rise.  Will follow closely 2. Acute on chronic CHF- 16 liters negative since admission but still has some edema.  Breathing is comfortable 3. Hypotension- on midodrine. 4. Anemia of CKD- on ESA and s/p IV iron 5. Elevated light chains- concern for myeloma and s/p bone marrow biopsy, results pending. Heme/Onc following.  Dr. Alen Blew has recommended bone survey to complete workup.  Donetta Potts, MD [employer] [phone]

## 2019-10-04 ENCOUNTER — Other Ambulatory Visit: Payer: Self-pay | Admitting: Oncology

## 2019-10-04 DIAGNOSIS — C9000 Multiple myeloma not having achieved remission: Secondary | ICD-10-CM

## 2019-10-04 DIAGNOSIS — I509 Heart failure, unspecified: Secondary | ICD-10-CM

## 2019-10-04 DIAGNOSIS — N185 Chronic kidney disease, stage 5: Secondary | ICD-10-CM

## 2019-10-04 LAB — CBC WITH DIFFERENTIAL/PLATELET
Abs Immature Granulocytes: 0.04 10*3/uL (ref 0.00–0.07)
Basophils Absolute: 0.1 10*3/uL (ref 0.0–0.1)
Basophils Relative: 1 %
Eosinophils Absolute: 0.1 10*3/uL (ref 0.0–0.5)
Eosinophils Relative: 1 %
HCT: 27.4 % — ABNORMAL LOW (ref 36.0–46.0)
Hemoglobin: 8.9 g/dL — ABNORMAL LOW (ref 12.0–15.0)
Immature Granulocytes: 0 %
Lymphocytes Relative: 19 %
Lymphs Abs: 1.9 10*3/uL (ref 0.7–4.0)
MCH: 33.3 pg (ref 26.0–34.0)
MCHC: 32.5 g/dL (ref 30.0–36.0)
MCV: 102.6 fL — ABNORMAL HIGH (ref 80.0–100.0)
Monocytes Absolute: 1.6 10*3/uL — ABNORMAL HIGH (ref 0.1–1.0)
Monocytes Relative: 16 %
Neutro Abs: 6.2 10*3/uL (ref 1.7–7.7)
Neutrophils Relative %: 63 %
Platelets: 316 10*3/uL (ref 150–400)
RBC: 2.67 MIL/uL — ABNORMAL LOW (ref 3.87–5.11)
RDW: 15.9 % — ABNORMAL HIGH (ref 11.5–15.5)
WBC: 9.8 10*3/uL (ref 4.0–10.5)
nRBC: 0 % (ref 0.0–0.2)

## 2019-10-04 LAB — RENAL FUNCTION PANEL
Albumin: 1.4 g/dL — ABNORMAL LOW (ref 3.5–5.0)
Anion gap: 13 (ref 5–15)
BUN: 50 mg/dL — ABNORMAL HIGH (ref 8–23)
CO2: 27 mmol/L (ref 22–32)
Calcium: 7.8 mg/dL — ABNORMAL LOW (ref 8.9–10.3)
Chloride: 98 mmol/L (ref 98–111)
Creatinine, Ser: 4.61 mg/dL — ABNORMAL HIGH (ref 0.44–1.00)
GFR calc Af Amer: 10 mL/min — ABNORMAL LOW (ref >60)
GFR calc non Af Amer: 9 mL/min — ABNORMAL LOW (ref >60)
Glucose, Bld: 85 mg/dL (ref 70–99)
Phosphorus: 5.5 mg/dL — ABNORMAL HIGH (ref 2.5–4.6)
Potassium: 4 mmol/L (ref 3.5–5.1)
Sodium: 138 mmol/L (ref 135–145)

## 2019-10-04 LAB — CULTURE, BLOOD (ROUTINE X 2)
Culture: NO GROWTH
Culture: NO GROWTH
Special Requests: ADEQUATE
Special Requests: ADEQUATE

## 2019-10-04 LAB — HEPATITIS B SURFACE ANTIBODY,QUALITATIVE: Hep B S Ab: NONREACTIVE

## 2019-10-04 MED ORDER — ALUM & MAG HYDROXIDE-SIMETH 200-200-20 MG/5ML PO SUSP
15.0000 mL | ORAL | Status: DC | PRN
  Administered 2019-10-04 – 2019-10-06 (×3): 15 mL via ORAL
  Filled 2019-10-04 (×3): qty 30

## 2019-10-04 MED ORDER — HEPARIN SODIUM (PORCINE) 1000 UNIT/ML IJ SOLN
INTRAMUSCULAR | Status: AC
  Administered 2019-10-04: 2400 [IU]
  Filled 2019-10-04: qty 3

## 2019-10-04 NOTE — Consult Note (Addendum)
Hospital Consult  VASCULAR SURGERY ASSESSMENT & PLAN:   END-STAGE RENAL DISEASE: This patient has a temporary right IJ dialysis catheter.  We have been asked to place a tunneled dialysis catheter and permanent hemodialysis access.  She is being treated for cellulitis of the right arm.  She has swelling and warmth of the right forearm however duplex scan showed no evidence of DVT or superficial thrombophlebitis in that arm.  Her vein map is pending.  If her right forearm swelling and cellulitis continued to improve then we could potentially proceed with placement of an AV fistula/AV graft and tunneled dialysis catheter on Thursday or Friday of this week.  I will follow-up on her vein map.  Deitra Mayo, MD, [place] [phone] Office: [phone]   Reason for Consult:  Dialysis access and [facility] Requesting Physician:  Marval Regal MRN #:  [id number]  History of Present Illness: This is a 78 y.o. female who presented to the hospital about 12 days ago.  She states that her daughter is a Marine scientist and felt something wasn't right.  Pt states she had about a 30lb weight gain and was had fluid all over.    Since being in the hospital, she has had a renal u/s, which suggested chronic renal dz.  Her workup during hospitalization is concerning for multiple myeloma and is still in progress.  She is being followed by medical oncology.    She did have some cellulitis in the right arm and RUE duplex was negative for DVT on 10/7.  She was on IV abx and she states this has improved.  Her right arm is restricted due to this.   She has hx of seizure disorder, HTN, heart failure, hypothyroidism.  She has severe malnutrition.   She had a son that had renal dz on dialysis.    The pt is on a statin for cholesterol management.  The pt is not on a daily aspirin.   The pt is on BB for hypertension.  She was on ARB and this was discontinued.   The pt is not diabetic.   Tobacco hx:  never  Past Medical History:   Diagnosis Date  . CHF (congestive heart failure) ([place])   . Coronary artery disease   . Dyspnea   . High cholesterol   . Hypertension   . Hypothyroidism   . Renal disorder   . Seizures ([place])   . Sleep apnea     Past Surgical History:  Procedure Laterality Date  . ABDOMINAL HYSTERECTOMY      Allergies  Allergen Reactions  . Sulfa Antibiotics Hives    Prior to Admission medications   Medication Sig Start Date End Date Taking? Authorizing Provider  atenolol (TENORMIN) 50 MG tablet Take 50 mg by mouth daily. 07/26/19  Yes [provider]  atorvastatin (LIPITOR) 80 MG tablet Take 80 mg by mouth daily. 09/11/19  Yes [provider]  felodipine (PLENDIL) 10 MG 24 hr tablet Take 10 mg by mouth daily. 08/15/19  Yes [provider]  gemfibrozil (LOPID) 600 MG tablet Take 1 tablet by mouth 2 (two) times daily. 06/14/19  Yes [provider]  hydrALAZINE (APRESOLINE) 25 MG tablet Take 25 mg by mouth 2 (two) times daily.   Yes [provider]  mometasone (NASONEX) 50 MCG/ACT nasal spray Place 1 spray into the nose 2 (two) times daily. 1 spray into each nostril twice daily 08/11/19  Yes [provider]  omeprazole (PRILOSEC) 40 MG capsule Take 40 mg by  mouth daily.   Yes [provider]  phenytoin (DILANTIN) 100 MG ER capsule Take 3 capsules by mouth at bedtime. 07/24/19  Yes [provider]  potassium chloride (KLOR-CON) 10 MEQ tablet Take 10 mEq by mouth daily. with food 08/16/19  Yes [provider]  losartan (COZAAR) 25 MG tablet Take 2 tablets by mouth daily. 09/05/19   [provider]    Social History   Socioeconomic History  . Marital status: Widowed    Spouse name: Not on file  . Number of children: Not on file  . Years of education: Not on file  . Highest education level: Not on file  Occupational History  . Not on file  Social Needs  . Financial resource strain: Not hard at all  . Food  insecurity    Worry: Never true    Inability: Never true  . Transportation needs    Medical: Yes    Non-medical: Yes  Tobacco Use  . Smoking status: Never Smoker  . Smokeless tobacco: Never Used  Substance and Sexual Activity  . Alcohol use: Never    Frequency: Never  . Drug use: Never  . Sexual activity: Not Currently  Lifestyle  . Physical activity    Days per week: 0 days    Minutes per session: 0 min  . Stress: Only a little  Relationships  . Social connections    Talks on phone: More than three times a week    Gets together: Twice a week    Attends religious service: 1 to 4 times per year    Active member of club or organization: No    Attends meetings of clubs or organizations: Never    Relationship status: Widowed  . Intimate partner violence    Fear of current or ex partner: No    Emotionally abused: No    Physically abused: No    Forced sexual activity: No  Other Topics Concern  . Not on file  Social History Narrative  . Not on file    Family History  Problem Relation Age of Onset  . Cancer Mother   . Cancer Sister   . Cancer Brother     ROS: [x]  Positive   [ ]  Negative   [ ]  All sytems reviewed and are negative  Cardiac: [x]  heart failure [x]  high blood pressure [x]  high cholesterol    Vascular: []  pain in legs while walking []  non-healing ulcers []  hx of DVT [x]  swelling in legs  Pulmonary: [x]  OSA   Neurologic: []  hx of CVA []  mini stroke [x]  hx seizures  Hematologic: [x]  possible multiple myeloma  Endocrine:   []  diabetes [x]  thyroid disease  GI []  vomiting blood []  blood in stool  GU: [x]  CKD/renal failure [x]  HD--[]  M/W/F or []  T/T/S  Psychiatric: []  anxiety []  depression  Musculoskeletal: []  arthritis []  joint pain  Integumentary: []  rashes []  ulcers  Constitutional: []  fever []  chills   Physical Examination  Vitals:   10/04/19 1430 10/04/19 1500  BP: 120/70 111/67  Pulse: 79 77  Resp:  20  Temp:     SpO2:     Body mass index is 32.82 kg/m.  General:  WDWN in NAD Gait: Not observed HENT: WNL, normocephalic Pulmonary: normal non-labored breathing, without Rales, rhonchi,  wheezing Cardiac: regular, without  Murmur Abdomen:  soft, NT/ND, no masses Skin: without rashes Vascular Exam/Pulses:  Right Left  Radial 2+ (normal) 2+ (normal)  Ulnar 1+ (weak) 1+ (weak)  DP 2+ (normal) 1+ (weak)  PT Unable to palpate  Unable to palpate    Extremities: without ischemic changes, without Gangrene , without cellulitis; without open wounds; +edema Musculoskeletal: no muscle wasting or atrophy  Neurologic: A&O X 3;  No focal weakness or paresthesias are detected; speech is fluent/normal Psychiatric:  The pt has Normal affect.  CBC    Component Value Date/Time   WBC 9.8 10/04/2019 0405   RBC 2.67 (L) 10/04/2019 0405   HGB 8.9 (L) 10/04/2019 0405   HCT 27.4 (L) 10/04/2019 0405   HCT 29.7 (L) 09/22/2019 0422   PLT 316 10/04/2019 0405   MCV 102.6 (H) 10/04/2019 0405   MCH 33.3 10/04/2019 0405   MCHC 32.5 10/04/2019 0405   RDW 15.9 (H) 10/04/2019 0405   LYMPHSABS 1.9 10/04/2019 0405   MONOABS 1.6 (H) 10/04/2019 0405   EOSABS 0.1 10/04/2019 0405   BASOSABS 0.1 10/04/2019 0405    BMET    Component Value Date/Time   NA 138 10/04/2019 0405   K 4.0 10/04/2019 0405   CL 98 10/04/2019 0405   CO2 27 10/04/2019 0405   GLUCOSE 85 10/04/2019 0405   BUN 50 (H) 10/04/2019 0405   CREATININE 4.61 (H) 10/04/2019 0405   CALCIUM 7.8 (L) 10/04/2019 0405   GFRNONAA 9 (L) 10/04/2019 0405   GFRAA 10 (L) 10/04/2019 0405    COAGS: No results found for: INR, PROTIME   Non-Invasive Vascular Imaging:   Vein mapping ordered today   ASSESSMENT/PLAN: This is a 78 y.o. female who is left hand dominant now with ESRD in need of dialysis access and TDC  -will order BUE vein mapping, but despite pt being left handed, she has had a cellulitis in the RUE this admission and has been on IV abx for this  and therefore, the arm is restricted.  Discussed access with pt and probable need to proceed with left arm access.  Will determine plan once vein mapping is complete.  -Dr. Scot Dock to see pt later today    Leontine Locket, PA-C [facility] [phone]

## 2019-10-04 NOTE — Procedures (Signed)
Pt declined CPAP at this time.  

## 2019-10-04 NOTE — Progress Notes (Signed)
.  OT Cancellation Note  Patient Details Name: Sophia Mcmahon MRN: [id number] DOB: 1941/07/04   Cancelled Treatment:    Reason Eval/Treat Not Completed: Patient at procedure or test/ unavailable;Other (comment) Pt off unit at HD. Will check back as time allows.  [place], [place] [facility] [place] 10/04/2019, 3:16 PM

## 2019-10-04 NOTE — Progress Notes (Signed)
PT Cancellation Note  Patient Details Name: Caisley Baxendale MRN: [id number] DOB: 03-Jun-1941   Cancelled Treatment:    Reason Eval/Treat Not Completed: Patient at procedure or test/unavailable. Pt off floor at HD. PT to return as able to progress ambulation.   Kittie Plater, PT, DPT Acute Rehabilitation Services Pager #: [phone] Office #: [phone]    [place] Lopes 10/04/2019, 12:47 PM

## 2019-10-04 NOTE — Progress Notes (Signed)
Pt to HD in bed in stable condition, report was given to HD nurse, pt had purewick malfuncvtion, pt cleansed and new gown and bedding, pt assisted to chair then back to bed, CCMD called to advise pt to HD

## 2019-10-04 NOTE — Progress Notes (Signed)
Renal Navigator notified by Nephrologist/Dr. Marval Regal that patient needs referral for OP HD treatment. Renal Navigator spoke with patient at bedside who asked Navigator to contact her daughter Danton Clap at [phone]. Renal Navigator contacted [facility] to discuss patient's need for OP HD treatment per MD. Danton Clap states she does not understand why she is being called about OP HD when she has stated that she wants her mother to have Home PD treatment. Renal Navigator explained that patient can transition to Home Therapy as soon as a PD catheter can be placed, but that at this time, she will need to be set up in center and transition to Home Therapy from there. Patient's daughter then sharply said, "it's fine. We'll go with [facility] in [place]." Renal Navigator will submit referral to [facility] in [place] and follow closely for acceptance and seat schedule. Patient is requesting a first shift MWF, but states understanding that this may not be available. She states she and her sister will provide transportation.  Alphonzo Cruise, [place] Renal Navigator [phone]

## 2019-10-04 NOTE — Progress Notes (Signed)
  Speech Language Pathology Treatment: Dysphagia  Patient Details Name: Shauntavia Brackin MRN: [id number] DOB: 07-01-1941 Today's Date: 10/04/2019 Time: 0950-1006 SLP Time Calculation (min) (ACUTE ONLY): 16 min  Assessment / Plan / Recommendation Clinical Impression  Pt initially appeared to do well and denied any difficulty with meals, but soon after having a few bites of cracker and sips of water she started gagging and regurgitated what she had just eaten. She is hopeful that she will feel better after her dialysis, but she also describes a h/o similar symptoms associated with her h/o GER. She shared that she had previously been taking ranitidine, which seemed to manage her symptoms well, but that since it had been discontinued she has not found anything that helped. Suspect that she has a functional oropharyngeal swallow but recommend that MD consider additional w/u for possible esophageal issues. Will f/u briefly.   HPI HPI: 78 year old female admitted 9/30 to [facility] with 30lb weight gain in 6 weeks & progressive renal failure, tx to [facility] on 10/2 in the setting of progressive cardiorenal syndrome, hypotension, and worsening acute on chronic renal failure.The patient required dopamine infusion in the ICU. She had a HD catheter placed, and she underwent CRRT until the catheter clotted off. As her FENa was low and consistent with cardiorenal syndrome or acute glomerulonephropathy urine immunofixation and UPEP were performed. Results are consistent with multiple myeloma.   Hematology Oncology was consulted on 09/28/2019.Bone scan was performed on 09/28/2019. I have discussed this with the patient's daughter. She stated that she will convey this to oncology today.  Chest xray was showing stable opacification at the bases from atelectasis/PNA and small pleural effusions.        SLP Plan  Continue with current plan of care       Recommendations  Diet recommendations: Regular;Thin  liquid Liquids provided via: Cup;Straw Medication Administration: Whole meds with liquid Supervision: Patient able to self feed;Intermittent supervision to cue for compensatory strategies Compensations: Slow rate;Small sips/bites Postural Changes and/or Swallow Maneuvers: Seated upright 90 degrees;Upright 30-60 min after meal                Oral Care Recommendations: Oral care BID Follow up Recommendations: None SLP Visit Diagnosis: Dysphagia, unspecified (R13.10) Plan: Continue with current plan of care       GO                Venita Sheffield Venola Castello 10/04/2019, 10:56 AM  Pollyann Glen, M.A. [place] Acute Environmental education officer [phone] Office [phone]

## 2019-10-04 NOTE — Progress Notes (Signed)
[facility] PROGRESS NOTE  Sophia Mcmahon XBD:[id number] DOB: [birth year] DOA: 09/21/2019 PCP: Frances Maywood, FNP  Brief summary   78 year old female admitted 9/30 to [facility] with 30lb weight gain in 6 weeks & progressive renal failure, tx to [facility] on 10/2 in the setting of progressive cardiorenal syndrome, hypotension, and worsening acute on chronic renal failure.The patient required dopamine infusion in the ICU. She had a HD catheter placed, and she underwent CRRT from 09/23/2021 09/27/2019 until the catheter clotted off. As her FENa was low and consistent with cardiorenal syndrome or acute glomerulonephropathy. Urine immunofixation and UPEP were performed. Results are concerning multiple myeloma.  Hematology Oncology was consulted on 09/28/2019.Bone scan was performed on 09/28/2019 which was unremarkable except diffuse osteopenia.  Oncology recommended bone marrow biopsy which was performed by Dr. Levan Hurst of IR on 09/30/19.  She was then diagnosed with right upper extremity cellulitis on 09/29/2019 and started on vancomycin and Zosyn which were de-escalated to IV Ancef on 10/01/2019.  Assessment/Plan:  Acute renal failure superimposed on chronic kidney disease -IV: Baseline creatinine/renal function unclear though at one time reported as CKD IV- followed by nephrologist pre admit): Acute kidney injury likely from acute cardiorenal syndrome versus ischemic ATN- was also on ARB prior to admit.found to have MM.Developedanuria/oliguria with worsening azotemia and hyperkalemia. started CRRT on 10/3 early AM - stopped on 10/6.  Patient is recent hemodialysis was on 09/29/2019.  Hemodialysis was held in anticipation that her renal function will improve however creatinine started to climb with decreasing urine output so she is going to have dialysis today.  Nephrology has consulted vascular surgery for [facility] as well as AV graft.  Acute hypoxic respiratory failure secondary to acute on  Chronic diastolic HF/bilateral pleural effusion/acute pulmonary edema: With transudative bilateralpleural effusions and pulmonary edema. I liter removed in thoracentesis. Monitor volume status. The patient is stable on room air. Echocardiogram performed on 10/1/2020demonstrated EF of 83-41% and diastolic dysfunction.  Bilateral pleural effusion could still be due to diastolic congestive heart failure. -on lasix 80 mg bid.  She saturating well above 90% on room air.  Cellulitis of the right arm: Right arm was swollen and was found to be negative for DVT by ultrasound on 10/7.   Patient initially was started on vancomycin and Zosyn on 09/29/2019 and this was switched to IV Ancef on 10/01/2019.  According to patient, her arm is looking much better today, less warm and less painful.  Continue Ancef for now.   Hypotension: Blood pressures are improved with midodrine 10 mg tid. - Monitor. BP  Light Chain Disease: Concern for multiple myeloma. Hematology/Oncology is following.  Dr. Hazeline Junker note from this morning noted. - BM biopsy was performed by Dr. Levan Hurst of IR 09/30/19.  Results are still pending.  Hx hypertension: The patient had been on multiple antihypertensives at home. Suspect this is due to toxicity from accumulation d/t renal failure. Antihypertensives are being held due to the patient's hypotension since admission. Goal MAP remains >65.  Now her blood pressure has started to get better.  We will watch closely and resume antihypertensives as needed.  Mild Macrocytic anemia: Hgb 9.5 on 09/25/19 -->8.9 -->8.8. likely chronic due to progressive CKD: There is no evidence of bleeding.  Hemoglobin remains a stable. -Monitor and transfuse for hemoglobin less than 7.  Elevated LFTs: Resolved  Hypothyroidism: TSH minimally elevated at 6.8. Continue Synthroid as at home. Likely euthyroid sick syndrome.  H/O seizure: Continue phenytoin  H/O GERD: Continue PPI.  Severe Malnutrition: I  appreciate nutrition's assistance.  Nutrition per their recommendations.  Deconditioning: The patient will likely require SNF or rehab at discharge.PT/OT ordered.  Code Status: full Family Communication: Discussed with patient.  Daughter was updated by nephrologist yesterday.  I did confirm this with the patient today. Disposition Plan: Needs snf medically cleared by nephrology.   Consultants:  PCCM  Nephrology  Hematology/Oncology  Procedures:  CRRT  HD  Thoracentesis  Bone marrow biopsy  Antibiotics: Anti-infectives (From admission, onward)   Start     Dose/Rate Route Frequency Ordered Stop   10/01/19 1300  ceFAZolin (ANCEF) IVPB 1 g/50 mL premix     1 g 100 mL/hr over 30 Minutes Intravenous Every 12 hours 10/01/19 1150     09/30/19 0100  piperacillin-tazobactam (ZOSYN) IVPB 3.375 g  Status:  Discontinued     3.375 g 12.5 mL/hr over 240 Minutes Intravenous Every 12 hours 09/29/19 1130 10/01/19 1127   09/29/19 1130  vancomycin variable dose per unstable renal function (pharmacist dosing)  Status:  Discontinued      Does not apply See admin instructions 09/29/19 1130 10/01/19 1150   09/29/19 1130  piperacillin-tazobactam (ZOSYN) IVPB 3.375 g     3.375 g 100 mL/hr over 30 Minutes Intravenous  Once 09/29/19 1115 09/29/19 2015   09/29/19 1130  vancomycin (VANCOCIN) 2,000 mg in sodium chloride 0.9 % 500 mL IVPB     2,000 mg 250 mL/hr over 120 Minutes Intravenous  Once 09/29/19 1115 09/29/19 2015       (indicate start date, and stop date if known)  HPI/Subjective: Patient seen and examined.  No new complaint.  Feels better.  Objective: Vitals:   10/04/19 1400 10/04/19 1430  BP: 122/64 120/70  Pulse: 78 79  Resp:    Temp:    SpO2:      Intake/Output Summary (Last 24 hours) at 10/04/2019 1453 Last data filed at 10/04/2019 1232 Gross per 24 hour  Intake 783 ml  Output 1650 ml  Net -867 ml   Filed Weights   10/01/19 0320 10/03/19 0614 10/04/19 1250   Weight: 102.3 kg 99.5 kg 97.9 kg    Exam:  General exam: Appears calm and comfortable, obese Respiratory system: Fine crackles at bases respiratory effort normal. Cardiovascular system: S1 & S2 heard, RRR. No JVD, murmurs, rubs, gallops or clicks.  +1 pitting edema bilateral lower extremity Gastrointestinal system: Abdomen is nondistended, soft and nontender. No organomegaly or masses felt. Normal bowel sounds heard. Central nervous system: Alert and oriented. No focal neurological deficits. Extremities: Symmetric 5 x 5 power.  Slightly edematous right upper extremity with some warmth and some tenderness. Skin: No rashes, lesions or ulcers.  Psychiatry: Judgement and insight appear normal. Mood & affect appropriate.   Data Reviewed: Basic Metabolic Panel: Recent Labs  Lab 09/28/19 0416  09/30/19 0756 10/01/19 0258 10/02/19 0351 10/03/19 0355 10/04/19 0405  NA 135   < > 135 137 136 137 138  K 4.4   < > 4.2 4.3 4.2 4.0 4.0  CL 103   < > 101 99 98 98 98  CO2 25   < > 26 27 28 27 27   GLUCOSE 99   < > 110* 98 92 96 85  BUN 22   < > 28* 35* 42* 48* 50*  CREATININE 2.61*   < > 3.38* 3.58* 3.96* 4.46* 4.61*  CALCIUM 7.2*   < > 7.5* 7.5* 7.6* 7.6* 7.8*  MG 2.0  --   --   --   --   --   --  PHOS 2.5   < > 3.5 3.8 4.2 4.6 5.5*   < > = values in this interval not displayed.   Liver Function Tests: Recent Labs  Lab 09/27/19 1712  09/30/19 0756 10/01/19 0258 10/02/19 0351 10/03/19 0355 10/04/19 0405  PROT 3.9*  --   --   --   --   --   --   ALBUMIN 1.4*   < > 1.4* 1.3* 1.3* 1.3* 1.4*   < > = values in this interval not displayed.   Recent Labs  Lab 10/01/19 1810  LIPASE 54*   No results for input(s): AMMONIA in the last 168 hours. CBC: Recent Labs  Lab 09/29/19 0314 10/01/19 0258 10/02/19 0351 10/03/19 0355 10/04/19 0405  WBC 25.4* 12.1* 11.1* 11.2* 9.8  NEUTROABS 18.9*  --   --   --  6.2  HGB 8.9* 8.8* 8.8* 8.6* 8.9*  HCT 26.7* 26.7* 26.5* 26.9* 27.4*  MCV  101.9* 102.7* 102.3* 104.3* 102.6*  PLT 141* 214 261 304 316   Cardiac Enzymes: No results for input(s): CKTOTAL, CKMB, CKMBINDEX, TROPONINI in the last 168 hours. BNP (last 3 results) Recent Labs    09/22/19 0121  BNP 1,148.0*    ProBNP (last 3 results) No results for input(s): PROBNP in the last 8760 hours.  CBG: No results for input(s): GLUCAP in the last 168 hours.  Recent Results (from the past 240 hour(s))  Body fluid culture     Status: None   Collection Time: 09/27/19  2:09 PM   Specimen: Body Fluid  Result Value Ref Range Status   Specimen Description FLUID RIGHT PLEURAL  Final   Special Requests NONE  Final   Gram Stain NO WBC SEEN NO ORGANISMS SEEN   Final   Culture   Final    NO GROWTH 3 DAYS Performed at [facility], [address], [place], [place] [address]    Report Status 10/01/2019 FINAL  Final  Culture, blood (routine x 2)     Status: None (Preliminary result)   Collection Time: 09/29/19  8:00 AM   Specimen: BLOOD LEFT ARM  Result Value Ref Range Status   Specimen Description BLOOD LEFT ARM  Final   Special Requests   Final    BOTTLES DRAWN AEROBIC AND ANAEROBIC Blood Culture adequate volume   Culture   Final    NO GROWTH 4 DAYS Performed at [facility], [place] [address], [place], [place] [address]    Report Status PENDING  Incomplete  Culture, blood (routine x 2)     Status: None (Preliminary result)   Collection Time: 09/29/19  8:20 AM   Specimen: BLOOD  Result Value Ref Range Status   Specimen Description BLOOD LEFT ANTECUBITAL  Final   Special Requests   Final    BOTTLES DRAWN AEROBIC AND ANAEROBIC Blood Culture adequate volume   Culture   Final    NO GROWTH 4 DAYS Performed at [facility], [place] [address], [place], [place] [address]    Report Status PENDING  Incomplete  Culture, Urine     Status: Abnormal   Collection Time: 09/29/19  8:09 PM   Specimen: Urine, Random  Result Value Ref Range Status   Specimen  Description URINE, RANDOM  Final   Special Requests   Final    NONE Performed at [facility], [place] [address], [place],  [address]    Culture >=100,000 COLONIES/mL STAPHYLOCOCCUS LUGDUNENSIS (A)  Final  Report Status 10/01/2019 FINAL  Final   Organism ID, Bacteria STAPHYLOCOCCUS LUGDUNENSIS (A)  Final      Susceptibility   Staphylococcus lugdunensis - MIC*    CIPROFLOXACIN <=0.5 SENSITIVE Sensitive     GENTAMICIN <=0.5 SENSITIVE Sensitive     NITROFURANTOIN <=16 SENSITIVE Sensitive     OXACILLIN >=4 RESISTANT Resistant     TETRACYCLINE <=1 SENSITIVE Sensitive     VANCOMYCIN <=0.5 SENSITIVE Sensitive     TRIMETH/SULFA <=10 SENSITIVE Sensitive     CLINDAMYCIN >=8 RESISTANT Resistant     RIFAMPIN <=0.5 SENSITIVE Sensitive     Inducible Clindamycin NEGATIVE Sensitive     * >=100,000 COLONIES/mL STAPHYLOCOCCUS LUGDUNENSIS     Studies: Dg Bone Survey Met  Result Date: 10/03/2019 CLINICAL DATA:  Light chain disease. EXAM: METASTATIC BONE SURVEY COMPARISON:  Metastatic bone survey dated 09/28/2019 FINDINGS: There are no lytic lesions in the skeleton. There are severe arthritic changes of both shoulders with multiple loose bodies in the right glenohumeral joint. There are arthritic changes of both knees. Chronic degenerative facet arthritis in the cervical spine. Since the prior bone survey of 09/28/2019 the patient has developed small bilateral pleural effusions. Central line in place with the tip at the level of the carina in good position. IMPRESSION: 1. No lytic lesions in the skeleton. 2. New small bilateral pleural effusions. 3. Severe arthritic changes of both shoulders with multiple loose bodies. Electronically Signed   By: Lorriane Shire M.D.   On: 10/03/2019 17:04    Scheduled Meds: . heparin      . Chlorhexidine Gluconate Cloth  6 each Topical Q0600  . darbepoetin (ARANESP) injection - NON-DIALYSIS  60 mcg Subcutaneous Q Sun-1800  . docusate sodium  100 mg Oral Daily   . feeding supplement (ENSURE ENLIVE)  237 mL Oral BID BM  . feeding supplement (PRO-STAT SUGAR FREE 64)  30 mL Oral BID  . furosemide  80 mg Intravenous Q12H  . heparin  5,000 Units Subcutaneous Q8H  . levothyroxine  25 mcg Oral Q0600  . midodrine  10 mg Oral TID WC  . pantoprazole  40 mg Oral Daily  . phenytoin  300 mg Oral QHS  . sodium chloride flush  3 mL Intravenous Q12H   Continuous Infusions: . sodium chloride 250 mL (10/03/19 2211)  .  ceFAZolin (ANCEF) IV 1 g (10/04/19 1053)    Principal Problem:   Acute renal failure superimposed on chronic kidney disease ([place]) Active Problems:   Protein-calorie malnutrition, severe (HCC)   Abnormal liver function   Anemia   Anasarca   Acute on chronic diastolic CHF (congestive heart failure) (HCC)   Cardiorenal syndrome   Acute renal failure (ARF) (HCC)   Pleural effusion   Leukocytosis   Multiple myeloma ([place])   Acute on chronic congestive heart failure ([place])  Time spent: 29 minutes  [facility] If 7PM-7AM, please contact night-coverage at [email or web address], password [facility] 10/04/2019, 2:53 PM  LOS: 12 days

## 2019-10-04 NOTE — Progress Notes (Signed)
Patient ID: Nelva Hauk, female   DOB: 03-21-1941, 78 y.o.   MRN: [id number] S: Not feeling well today.  Had some nausea again this am. O:BP (!) 116/56 (BP Location: Left Arm)   Pulse 84   Temp 98.7 F (37.1 C) (Oral)   Resp 20   Ht _0  (1.727 m)   Wt 99.5 kg   SpO2 97%   BMI 33.36 kg/m   Intake/Output Summary (Last 24 hours) at 10/04/2019 1041 Last data filed at 10/04/2019 0823 Gross per 24 hour  Intake 720 ml  Output 1350 ml  Net -630 ml   Intake/Output: I/O last 3 completed shifts: In: 1336.5 [P.O.:1200; I.V.:36.5; IV Piggyback:100] Out: 600 [Urine:600]  Intake/Output this shift:  Total I/O In: 120 [P.O.:120] Out: 900 [Urine:900] Weight change:  Gen: NAD CVS: no rub Resp: cta Abd: +BS, soft, NT Ext: 1+ edema of lower ext, 2+ edema of right upper arm.  Recent Labs  Lab 09/28/19 0416 09/28/19 1708 09/29/19 0314 09/30/19 0756 10/01/19 0258 10/02/19 0351 10/03/19 0355 10/04/19 0405  NA 135 133* 136 135 137 136 137 138  K 4.4 3.9 4.3 4.2 4.3 4.2 4.0 4.0  CL 103 100 102 101 99 98 98 98  CO2 _1 GLUCOSE 99 127* 102* 110* 98 92 96 85  BUN _2 28* 35* 42* 48* 50*  CREATININE 2.61* 2.02* 2.49* 3.38* 3.58* 3.96* 4.46* 4.61*  ALBUMIN 1.4* 1.4*  --  1.4* 1.3* 1.3* 1.3* 1.4*  CALCIUM 7.2* 7.5* 7.4* 7.5* 7.5* 7.6* 7.6* 7.8*  PHOS 2.5 2.4*  --  3.5 3.8 4.2 4.6 5.5*   Liver Function Tests: Recent Labs  Lab 09/27/19 1712  10/02/19 0351 10/03/19 0355 10/04/19 0405  PROT 3.9*  --   --   --   --   ALBUMIN 1.4*   < > 1.3* 1.3* 1.4*   < > = values in this interval not displayed.   Recent Labs  Lab 10/01/19 1810  LIPASE 54*   No results for input(s): AMMONIA in the last 168 hours. CBC: Recent Labs  Lab 09/29/19 0314 10/01/19 0258 10/02/19 0351 10/03/19 0355 10/04/19 0405  WBC 25.4* 12.1* 11.1* 11.2* 9.8  NEUTROABS 18.9*  --   --   --  6.2  HGB 8.9* 8.8* 8.8* 8.6* 8.9*  HCT 26.7* 26.7* 26.5* 26.9* 27.4*  MCV 101.9* 102.7*  102.3* 104.3* 102.6*  PLT 141* 214 261 304 316   Cardiac Enzymes: No results for input(s): CKTOTAL, CKMB, CKMBINDEX, TROPONINI in the last 168 hours. CBG: No results for input(s): GLUCAP in the last 168 hours.  Iron Studies: No results for input(s): IRON, TIBC, TRANSFERRIN, FERRITIN in the last 72 hours. Studies/Results: Dg Bone Survey Met  Result Date: 10/03/2019 CLINICAL DATA:  Light chain disease. EXAM: METASTATIC BONE SURVEY COMPARISON:  Metastatic bone survey dated 09/28/2019 FINDINGS: There are no lytic lesions in the skeleton. There are severe arthritic changes of both shoulders with multiple loose bodies in the right glenohumeral joint. There are arthritic changes of both knees. Chronic degenerative facet arthritis in the cervical spine. Since the prior bone survey of 09/28/2019 the patient has developed small bilateral pleural effusions. Central line in place with the tip at the level of the carina in good position. IMPRESSION: 1. No lytic lesions in the skeleton. 2. New small bilateral pleural effusions. 3. Severe arthritic changes of both shoulders with multiple loose bodies. Electronically Signed   By: Lorriane Shire  M.D.   On: 10/03/2019 17:04   . Chlorhexidine Gluconate Cloth  6 each Topical Daily  . Chlorhexidine Gluconate Cloth  6 each Topical Q0600  . Chlorhexidine Gluconate Cloth  6 each Topical Q0600  . darbepoetin (ARANESP) injection - NON-DIALYSIS  60 mcg Subcutaneous Q Sun-1800  . docusate sodium  100 mg Oral Daily  . feeding supplement (ENSURE ENLIVE)  237 mL Oral BID BM  . feeding supplement (PRO-STAT SUGAR FREE 64)  30 mL Oral BID  . furosemide  80 mg Intravenous Q12H  . heparin  5,000 Units Subcutaneous Q8H  . levothyroxine  25 mcg Oral Q0600  . midodrine  10 mg Oral TID WC  . pantoprazole  40 mg Oral Daily  . phenytoin  300 mg Oral QHS  . sodium chloride flush  3 mL Intravenous Q12H    BMET    Component Value Date/Time   NA 138 10/04/2019 0405   K 4.0  10/04/2019 0405   CL 98 10/04/2019 0405   CO2 27 10/04/2019 0405   GLUCOSE 85 10/04/2019 0405   BUN 50 (H) 10/04/2019 0405   CREATININE 4.61 (H) 10/04/2019 0405   CALCIUM 7.8 (L) 10/04/2019 0405   GFRNONAA 9 (L) 10/04/2019 0405   GFRAA 10 (L) 10/04/2019 0405   CBC    Component Value Date/Time   WBC 9.8 10/04/2019 0405   RBC 2.67 (L) 10/04/2019 0405   HGB 8.9 (L) 10/04/2019 0405   HCT 27.4 (L) 10/04/2019 0405   HCT 29.7 (L) 09/22/2019 0422   PLT 316 10/04/2019 0405   MCV 102.6 (H) 10/04/2019 0405   MCH 33.3 10/04/2019 0405   MCHC 32.5 10/04/2019 0405   RDW 15.9 (H) 10/04/2019 0405   LYMPHSABS 1.9 10/04/2019 0405   MONOABS 1.6 (H) 10/04/2019 0405   EOSABS 0.1 10/04/2019 0405   BASOSABS 0.1 10/04/2019 0405     Assessment/Plan:  1. AKI/CKD stage 3 (Cr was 1.5 in June 2020) in setting of cardiorenal syndrome vs ischemic ATN vs. Light chain disease/plasma cell disorder.  She developed oliguria and worsening azotemia and hyperkalemia with hypotension and started on CVVHD on 09/24/19 and had intermittent HD on 09/27/19 and 09/29/19 with some improvement of UOP but climbing BUN/Cr.   1. Will plan for HD today due to rising BUN/Cr and associated nausea 2. Will consult [facility]VS for [facility] and AVF/AVG placement as well 3. Will start CLIP process for outpatient HD 4. Family is very involved and home therapies will likely be an excellent option for them. 2. Acute on chronic CHF- 16 liters negative since admission but still has some edema.  Breathing is comfortable 3. Hypotension- on midodrine. 4. Anemia of CKD- on ESA and s/p IV iron 5. Elevated light chains- concern for light chain multiple myeloma but light chain specification is pending. Dr. Alen Blew is following and bone survey negative for lytic lesions. 6. Cellulitis of right arm- negative for DVT and treated with vanc/zosyn then changed to ancef.  Clinically improving.  7. Vascular access- will need TDC and AVF/AVG placement during this  hospitalization   Donetta Potts, MD [employer] [phone]

## 2019-10-04 NOTE — Progress Notes (Signed)
Events in the last few days noted.  Laboratory data and clinical status updated appears overall stable.  Her skeletal survey did not show any evidence of lytic lesions.  Bone marrow biopsy obtained on 09/30/2019 showed a preliminary increase in her plasma cell infiltration of close to 14%.  These findings associated with small clusters without any sheets of plasma cell infiltration.  CD138 as well as the kappa and lambda specification is still pending.  Although the findings in the bone marrow are highly suspicious for plasma cell disorder, I would like to await the results of the kappa and lambda specification.  This will ensure that we are dealing with clonal disease and not a reactive plasmacytosis.  The increase in her serum light chain is very concerning which makes this likely light chain multiple myeloma but confirmation would be important at this time.   I will update the patient's daughter about these findings.  She will need follow-up at [facility] at [facility]  upon her discharge for follow-up on the results as well as discussion of treatment.

## 2019-10-04 NOTE — Progress Notes (Signed)
Renal Navigator completed referral to [facility] by faxing all requested medical records except for HepB surface antibody, which Navigator asked Nephrologist/Dr. Marval Regal to order. Renal Navigator will submit lab when it has resulted.  Alphonzo Cruise, [place] Renal Navigator [phone]

## 2019-10-05 ENCOUNTER — Inpatient Hospital Stay (HOSPITAL_COMMUNITY): Payer: Medicare Other

## 2019-10-05 ENCOUNTER — Encounter (HOSPITAL_COMMUNITY): Payer: Medicare Other

## 2019-10-05 DIAGNOSIS — N186 End stage renal disease: Secondary | ICD-10-CM

## 2019-10-05 LAB — CBC WITH DIFFERENTIAL/PLATELET
Abs Immature Granulocytes: 0.04 10*3/uL (ref 0.00–0.07)
Basophils Absolute: 0.1 10*3/uL (ref 0.0–0.1)
Basophils Relative: 1 %
Eosinophils Absolute: 0 10*3/uL (ref 0.0–0.5)
Eosinophils Relative: 0 %
HCT: 27.6 % — ABNORMAL LOW (ref 36.0–46.0)
Hemoglobin: 8.8 g/dL — ABNORMAL LOW (ref 12.0–15.0)
Immature Granulocytes: 0 %
Lymphocytes Relative: 21 %
Lymphs Abs: 1.9 10*3/uL (ref 0.7–4.0)
MCH: 33.3 pg (ref 26.0–34.0)
MCHC: 31.9 g/dL (ref 30.0–36.0)
MCV: 104.5 fL — ABNORMAL HIGH (ref 80.0–100.0)
Monocytes Absolute: 1.5 10*3/uL — ABNORMAL HIGH (ref 0.1–1.0)
Monocytes Relative: 16 %
Neutro Abs: 5.6 10*3/uL (ref 1.7–7.7)
Neutrophils Relative %: 62 %
Platelets: 288 10*3/uL (ref 150–400)
RBC: 2.64 MIL/uL — ABNORMAL LOW (ref 3.87–5.11)
RDW: 16.2 % — ABNORMAL HIGH (ref 11.5–15.5)
WBC: 9.1 10*3/uL (ref 4.0–10.5)
nRBC: 0 % (ref 0.0–0.2)

## 2019-10-05 LAB — RENAL FUNCTION PANEL
Albumin: 1.4 g/dL — ABNORMAL LOW (ref 3.5–5.0)
Anion gap: 12 (ref 5–15)
BUN: 34 mg/dL — ABNORMAL HIGH (ref 8–23)
CO2: 26 mmol/L (ref 22–32)
Calcium: 7.6 mg/dL — ABNORMAL LOW (ref 8.9–10.3)
Chloride: 99 mmol/L (ref 98–111)
Creatinine, Ser: 3.95 mg/dL — ABNORMAL HIGH (ref 0.44–1.00)
GFR calc Af Amer: 12 mL/min — ABNORMAL LOW (ref >60)
GFR calc non Af Amer: 10 mL/min — ABNORMAL LOW (ref >60)
Glucose, Bld: 86 mg/dL (ref 70–99)
Phosphorus: 5.2 mg/dL — ABNORMAL HIGH (ref 2.5–4.6)
Potassium: 3.9 mmol/L (ref 3.5–5.1)
Sodium: 137 mmol/L (ref 135–145)

## 2019-10-05 NOTE — TOC Progression Note (Signed)
Transition of Care [facility]) - Progression Note    Patient Details  Name: Jesscia Imm MRN: [id number] Date of Birth: [birth year]  Transition of [facility]) CM/SW [place], [place] Phone Number: [phone] 10/05/2019, 3:15 PM  Clinical Narrative:     CSW received all from daughter Danton Clap wanting an update on patient being assigned dialysis placement. CSW informed her that renal navigator Terri Piedra working and [place] will ask her to reach out to [employer].  CSW informed Danton Clap of [facility] PT recommendation, Danton Clap reports they wanted [place]. CSW notes that previous CSW notation states that patient is set up with [facility]. Alice reports she would like CSW to follow up with [facility] for [place] followed up with [facility] who states they do not take patient's insurance and CSW also followed up with [facility] that also do not take patient's insurance.   CSW informed Danton Clap of this and informed her that patient is currently able to be accepted by [facility] for [facility] PT, Danton Clap reports she has been a Marine scientist for 18 years and can take care of patient and will be refusing Home Health PT recommendation at this time. CSW mentioned DME needs for 3 in1 previously noted, Alice also reported refusing DME recs at this time.   No social work needs identified due to refusals.    Expected Discharge Plan: [place] Barriers to Discharge: Continued Medical Work up  Expected Discharge Plan and Services Expected Discharge Plan: [place] In-house Referral: Clinical Social Work Discharge Planning Services: [place] Post Acute Care Choice: [employer] arrangements for the past 2 months: Single Family Home Expected Discharge Date: 09/25/19               DME Arranged: 3-N-1 DME Agency: [employer] Date DME Agency Contacted: 10/03/19 Time DME Agency Contacted: [phone] Representative spoke with at DME Agency: CSW left VM with Thedore Mins asking for DME HH Arranged:  Speech Therapy, PT, OT HH Agency: [facility] Date [place]: 10/03/19 Time [place]: [place] Representative spoke with at [place]: [place] ([place]) Interventions    Readmission Risk Interventions No flowsheet data found.

## 2019-10-05 NOTE — Progress Notes (Signed)
VASCULAR SURGERY:  I reviewed her vein map today which shows that the upper arm cephalic vein on the left although somewhat small might potentially be usable for a fistula.  If she is medically ready, I plan on proceeding with a left brachiocephalic fistula on Friday.  Please try to not arrange dialysis on Friday as she will be scheduled for surgery.  Deitra Mayo, MD, [place] [phone] Office: [phone]

## 2019-10-05 NOTE — Progress Notes (Signed)
Physical Therapy Treatment Patient Details Name: Sophia Mcmahon MRN: [id number] DOB: 1941-06-11 Today's Date: 10/05/2019    History of Present Illness 78 year old woman with CAD, OSA, hypertension, hyperlipidemia, obesity, HFpEF (TTE 09/22/2019 EF 65 to 70%), hypothyroidism, CKD with unknown baseline presenting with acute heart failure and acute on chronic renal failure. Placed on CRRT at [facility] 09/24/19-09/27/19. s/p thoracentesis R pleural effusion 09/27/19    PT Comments    Pt progressing well and very motivated to return to baseline. Pt tolerated 300' of ambulation with seated rest break at 150' using both RW then transitioning to pushing IV pole with L UE. Acute PT to cont to follow.   Follow Up Recommendations  Home health PT;Supervision/Assistance - 24 hour     Equipment Recommendations  Rolling walker with 5" wheels    Recommendations for Other Services       Precautions / Restrictions Precautions Precautions: Fall Restrictions Weight Bearing Restrictions: No    Mobility  Bed Mobility Overal bed mobility: Needs Assistance Bed Mobility: Supine to Sit     Supine to sit: Min guard     General bed mobility comments: pt initiated transfer, HOB elevated, used bed rail  Transfers Overall transfer level: Needs assistance Equipment used: Rolling walker (2 wheeled) Transfers: Sit to/from Stand Sit to Stand: Min assist         General transfer comment: minA to power up and steady during transition of hands to RW  Ambulation/Gait Ambulation/Gait assistance: Min guard;Min assist Gait Distance (Feet): 150 Feet(x2) Assistive device: Rolling walker (2 wheeled);IV Pole Gait Pattern/deviations: Step-through pattern;Decreased stride length;Wide base of support Gait velocity: decreased Gait velocity interpretation: 1.31 - 2.62 ft/sec, indicative of limited community ambulator General Gait Details: pt with onset of fatigue at 100' requiring minA for stability with RW, minA  for walker management around obstacles. second 150' trial pt used L HHA transitioning to pushign IV pole. pt with LOB at end of 150' due to onset of fatigue requiring minA to prevent fall   Stairs             Wheelchair Mobility    Modified Rankin (Stroke Patients Only)       Balance Overall balance assessment: Needs assistance Sitting-balance support: No upper extremity supported;Feet supported Sitting balance-Leahy Scale: Good     Standing balance support: Bilateral upper extremity supported Standing balance-Leahy Scale: Fair Standing balance comment: walker and min guard for static standing                            Cognition Arousal/Alertness: Awake/alert Behavior During Therapy: WFL for tasks assessed/performed Overall Cognitive Status: Within Functional Limits for tasks assessed                                        Exercises      General Comments General comments (skin integrity, edema, etc.): VSS      Pertinent Vitals/Pain Pain Assessment: 0-10 Pain Score: 0-No pain Pain Location: back Pain Descriptors / Indicators: Discomfort Pain Intervention(s): Monitored during session    Home Living                      Prior Function            PT Goals (current goals can now be found in the care plan section) Progress towards PT goals: Progressing  toward goals    Frequency    Min 3X/week      PT Plan Current plan remains appropriate    Co-evaluation              AM-PAC PT "6 Clicks" Mobility   Outcome Measure  Help needed turning from your back to your side while in a flat bed without using bedrails?: None Help needed moving from lying on your back to sitting on the side of a flat bed without using bedrails?: A Little Help needed moving to and from a bed to a chair (including a wheelchair)?: A Little Help needed standing up from a chair using your arms (e.g., wheelchair or bedside chair)?: A  Little Help needed to walk in hospital room?: A Little Help needed climbing 3-5 steps with a railing? : A Little 6 Click Score: 19    End of Session Equipment Utilized During Treatment: Gait belt Activity Tolerance: Patient tolerated treatment well Patient left: in chair;with call bell/phone within reach Nurse Communication: Mobility status PT Visit Diagnosis: Unsteadiness on feet (R26.81);Other abnormalities of gait and mobility (R26.89);Muscle weakness (generalized) (M62.81);Difficulty in walking, not elsewhere classified (R26.2)     Time: 1000-1022 PT Time Calculation (min) (ACUTE ONLY): 22 min  Charges:  $Gait Training: 8-22 mins                     Kittie Plater, PT, DPT Acute Rehabilitation Services Pager #: [phone] Office #: [phone]    Berline Lopes 10/05/2019, 1:36 PM

## 2019-10-05 NOTE — Progress Notes (Signed)
Left upper extremity vein mapping completed. Refer to "CV Proc" under chart review to view preliminary results.  10/05/2019 10:44 AM Maudry Mayhew, MHA, RVT, RDCS, RDMS

## 2019-10-05 NOTE — Progress Notes (Signed)
Patient had question regarding plan of care for patient.   Paged Nephrologist to see if they can call patient's daughter Takhia Spoon to give an update

## 2019-10-05 NOTE — Progress Notes (Addendum)
Patient ID: Kalisi Bevill, female   DOB: 23-Apr-1941, 78 y.o.   MRN: [id number] S: feels better today O:BP 118/61 (BP Location: Left Arm)   Pulse 86   Temp 98.4 F (36.9 C) (Oral)   Resp 18   Ht 5' 8"  (1.727 m)   Wt 94.8 kg   SpO2 100%   BMI 31.78 kg/m   Intake/Output Summary (Last 24 hours) at 10/05/2019 1414 Last data filed at 10/05/2019 1212 Gross per 24 hour  Intake 1516.43 ml  Output 3000 ml  Net -1483.57 ml   Intake/Output: I/O last 3 completed shifts: In: 1387.5 [P.O.:1138; I.V.:99.5; IV Piggyback:150] Out: 0165 [Urine:2350; Other:1500]  Intake/Output this shift:  Total I/O In: 671.9 [P.O.:600; I.V.:19.6; IV Piggyback:52.3] Out: 350 [Urine:350] Weight change:  Gen: NAD CVS: no rub Resp:cta Abd: +BS, soft, NT/ND Ext: 1+ pretibial edema  Recent Labs  Lab 09/28/19 1708 09/29/19 0314 09/30/19 0756 10/01/19 0258 10/02/19 0351 10/03/19 0355 10/04/19 0405 10/05/19 0508  NA 133* 136 135 137 136 137 138 137  K 3.9 4.3 4.2 4.3 4.2 4.0 4.0 3.9  CL 100 102 101 99 98 98 98 99  CO2 26 27 26 27 28 27 27 26   GLUCOSE 127* 102* 110* 98 92 96 85 86  BUN 14 17 28* 35* 42* 48* 50* 34*  CREATININE 2.02* 2.49* 3.38* 3.58* 3.96* 4.46* 4.61* 3.95*  ALBUMIN 1.4*  --  1.4* 1.3* 1.3* 1.3* 1.4* 1.4*  CALCIUM 7.5* 7.4* 7.5* 7.5* 7.6* 7.6* 7.8* 7.6*  PHOS 2.4*  --  3.5 3.8 4.2 4.6 5.5* 5.2*   Liver Function Tests: Recent Labs  Lab 10/03/19 0355 10/04/19 0405 10/05/19 0508  ALBUMIN 1.3* 1.4* 1.4*   Recent Labs  Lab 10/01/19 1810  LIPASE 54*   No results for input(s): AMMONIA in the last 168 hours. CBC: Recent Labs  Lab 09/29/19 0314 10/01/19 0258 10/02/19 0351 10/03/19 0355 10/04/19 0405 10/05/19 0508  WBC 25.4* 12.1* 11.1* 11.2* 9.8 9.1  NEUTROABS 18.9*  --   --   --  6.2 5.6  HGB 8.9* 8.8* 8.8* 8.6* 8.9* 8.8*  HCT 26.7* 26.7* 26.5* 26.9* 27.4* 27.6*  MCV 101.9* 102.7* 102.3* 104.3* 102.6* 104.5*  PLT 141* 214 261 304 316 288   Cardiac Enzymes: No results  for input(s): CKTOTAL, CKMB, CKMBINDEX, TROPONINI in the last 168 hours. CBG: No results for input(s): GLUCAP in the last 168 hours.  Iron Studies: No results for input(s): IRON, TIBC, TRANSFERRIN, FERRITIN in the last 72 hours. Studies/Results: Dg Bone Survey Met  Result Date: 10/03/2019 CLINICAL DATA:  Light chain disease. EXAM: METASTATIC BONE SURVEY COMPARISON:  Metastatic bone survey dated 09/28/2019 FINDINGS: There are no lytic lesions in the skeleton. There are severe arthritic changes of both shoulders with multiple loose bodies in the right glenohumeral joint. There are arthritic changes of both knees. Chronic degenerative facet arthritis in the cervical spine. Since the prior bone survey of 09/28/2019 the patient has developed small bilateral pleural effusions. Central line in place with the tip at the level of the carina in good position. IMPRESSION: 1. No lytic lesions in the skeleton. 2. New small bilateral pleural effusions. 3. Severe arthritic changes of both shoulders with multiple loose bodies. Electronically Signed   By: Lorriane Shire M.D.   On: 10/03/2019 17:04   Vas [place] Upper Ext Vein Mapping (pre-op Avf)  Result Date: 10/05/2019 UPPER EXTREMITY VEIN MAPPING  Indications: Pre-access. Comparison Study: No prior study. Performing Technologist: Maudry Mayhew MHA, RDMS, RVT, RDCS  Examination Guidelines: A complete evaluation includes B-mode imaging, spectral Doppler, color Doppler, and power Doppler as needed of all accessible portions of each vessel. Bilateral testing is considered an integral part of a complete examination. Limited examinations for reoccurring indications may be performed as noted. +-----------------+-------------+----------+---------+ Left Cephalic    Diameter (cm)Depth (cm)Findings  +-----------------+-------------+----------+---------+ Shoulder             0.30        0.69             +-----------------+-------------+----------+---------+ Prox  upper arm       0.32        0.81             +-----------------+-------------+----------+---------+ Mid upper arm        0.31        0.59   branching +-----------------+-------------+----------+---------+ Dist upper arm       0.36        0.75             +-----------------+-------------+----------+---------+ Antecubital fossa    0.38        0.34   branching +-----------------+-------------+----------+---------+ Prox forearm         0.26        0.63             +-----------------+-------------+----------+---------+ Mid forearm          0.18        0.56   branching +-----------------+-------------+----------+---------+ Dist forearm         0.21        0.22             +-----------------+-------------+----------+---------+ Wrist                0.24        0.28             +-----------------+-------------+----------+---------+ +-----------------+-------------+----------+--------------+ Left Basilic     Diameter (cm)Depth (cm)   Findings    +-----------------+-------------+----------+--------------+ Prox upper arm       0.51                              +-----------------+-------------+----------+--------------+ Mid upper arm        0.45                              +-----------------+-------------+----------+--------------+ Dist upper arm       0.33                 branching    +-----------------+-------------+----------+--------------+ Antecubital fossa    0.18                 branching    +-----------------+-------------+----------+--------------+ Prox forearm         0.12                              +-----------------+-------------+----------+--------------+ Mid forearm                             not visualized +-----------------+-------------+----------+--------------+ Distal forearm                          not visualized +-----------------+-------------+----------+--------------+ Wrist  not  visualized +-----------------+-------------+----------+--------------+ *See table(s) above for measurements and observations.  Diagnosing physician: Deitra Mayo MD Electronically signed by Deitra Mayo MD on 10/05/2019 at 12:00:09 PM.    Final    . Chlorhexidine Gluconate Cloth  6 each Topical Q0600  . darbepoetin (ARANESP) injection - NON-DIALYSIS  60 mcg Subcutaneous Q Sun-1800  . docusate sodium  100 mg Oral Daily  . feeding supplement (ENSURE ENLIVE)  237 mL Oral BID BM  . feeding supplement (PRO-STAT SUGAR FREE 64)  30 mL Oral BID  . furosemide  80 mg Intravenous Q12H  . heparin  5,000 Units Subcutaneous Q8H  . levothyroxine  25 mcg Oral Q0600  . midodrine  10 mg Oral TID WC  . pantoprazole  40 mg Oral Daily  . phenytoin  300 mg Oral QHS  . sodium chloride flush  3 mL Intravenous Q12H    BMET    Component Value Date/Time   NA 137 10/05/2019 0508   K 3.9 10/05/2019 0508   CL 99 10/05/2019 0508   CO2 26 10/05/2019 0508   GLUCOSE 86 10/05/2019 0508   BUN 34 (H) 10/05/2019 0508   CREATININE 3.95 (H) 10/05/2019 0508   CALCIUM 7.6 (L) 10/05/2019 0508   GFRNONAA 10 (L) 10/05/2019 0508   GFRAA 12 (L) 10/05/2019 0508   CBC    Component Value Date/Time   WBC 9.1 10/05/2019 0508   RBC 2.64 (L) 10/05/2019 0508   HGB 8.8 (L) 10/05/2019 0508   HCT 27.6 (L) 10/05/2019 0508   HCT 29.7 (L) 09/22/2019 0422   PLT 288 10/05/2019 0508   MCV 104.5 (H) 10/05/2019 0508   MCH 33.3 10/05/2019 0508   MCHC 31.9 10/05/2019 0508   RDW 16.2 (H) 10/05/2019 0508   LYMPHSABS 1.9 10/05/2019 0508   MONOABS 1.5 (H) 10/05/2019 0508   EOSABS 0.0 10/05/2019 0508   BASOSABS 0.1 10/05/2019 0508    Assessment/Plan:  1. AKI/CKD stage 3 (Cr was 1.5 in June 2020) in setting of cardiorenal syndrome vs ischemic ATN vs. Light chain disease/plasma cell disorder. She developed oliguria and worsening azotemia and hyperkalemia with hypotension and started on CVVHD on 09/24/19 and had intermittent  HD on 09/27/19 and 09/29/19 with some improvement of UOP but climbing BUN/Cr.  1. Will plan for HD tomorrow due to rising BUN/Cr and associated nausea although UOP has significantly improved.  Will recheck labs before going to HD. 2. VVS for [facility] and AVF/AVG placement as well 3. Outpatient HD to be arranged at [facility] 4. Hopefully she can leave after AVF/AVG placement 5. Family is very involved and home therapies will likely be an excellent option for them and I spoke with her daughter Danton Clap who is a dialysis nurse and will arrange PD catheter placement as an outpatient in [place]. 2. Acute on chronic CHF- 16 liters negative since admission but still has some edema. Breathing is comfortable 3. Hypotension- on midodrine. 4. Anemia of CKD- on ESA and s/p IV iron 5. Elevated light chains- concern for light chain multiple myeloma but light chain specification is pending. Dr. Alen Blew is following and bone survey negative for lytic lesions. 6. Cellulitis of right arm- negative for DVT and treated with vanc/zosyn then changed to ancef.  Clinically improving.  7. Vascular access- will need TDC and AVF/AVG placement 10/07/19.  Will d/c HD catheter tomorrow after HD.  Donetta Potts, MD [employer] [phone]

## 2019-10-05 NOTE — Progress Notes (Signed)
Occupational Therapy Treatment Patient Details Name: Sophia Mcmahon MRN: [id number] DOB: 1941-08-15 Today's Date: 10/05/2019    History of present illness 78 year old woman with CAD, OSA, hypertension, hyperlipidemia, obesity, HFpEF (TTE 09/22/2019 EF 65 to 70%), hypothyroidism, CKD with unknown baseline presenting with acute heart failure and acute on chronic renal failure. Placed on CRRT at [facility] 09/24/19-09/27/19. s/p thoracentesis R pleural effusion 09/27/19   OT comments  Patient progressing well.  Completing transfers with min guard assist using RW, sustained standing balance at sink during grooming tasks with supervision, simulated IADL by placing soiled linen in bag with min guard assist, and LB dressing with min guard assist.  Initiated education on energy conservation and utilization of 3:1 as shower chair and commode chair.  Reports plan to dc home with daughters support.  Will follow acutely.    Follow Up Recommendations  Home health OT;Supervision/Assistance - 24 hour    Equipment Recommendations  3 in 1 bedside commode    Recommendations for Other Services      Precautions / Restrictions Precautions Precautions: Fall Restrictions Weight Bearing Restrictions: No       Mobility Bed Mobility Overal bed mobility: Needs Assistance Bed Mobility: Supine to Sit     Supine to sit: Min guard     General bed mobility comments: OOB in recliner upon entry   Transfers Overall transfer level: Needs assistance Equipment used: Rolling walker (2 wheeled) Transfers: Sit to/from Stand Sit to Stand: Min guard         General transfer comment: min guard for balance/stability, good hand placement     Balance Overall balance assessment: Needs assistance Sitting-balance support: No upper extremity supported;Feet supported Sitting balance-Leahy Scale: Good     Standing balance support: Bilateral upper extremity supported;During functional activity Standing balance-Leahy  Scale: Fair Standing balance comment: min guard dynamically with BUE support, able to satnd during grooming with supervision                            ADL either performed or assessed with clinical judgement   ADL Overall ADL's : Needs assistance/impaired     Grooming: Supervision/safety;Standing;Wash/dry hands;Wash/dry face;Oral care               Lower Body Dressing: Min guard;Sit to/from stand Lower Body Dressing Details (indicate cue type and reason): able to don/doff socks seated with increased time, min guard sit to stand  Toilet Transfer: Min guard;Ambulation;RW Toilet Transfer Details (indicate cue type and reason): simulated in room, discussed use of 3:1 over commode for home use        Tub/Shower Transfer Details (indicate cue type and reason): discussed use of 3:1 for shower chair at dc  Functional mobility during ADLs: Min guard;Rolling walker General ADL Comments: pt progressing well     Vision       Perception     Praxis      Cognition Arousal/Alertness: Awake/alert Behavior During Therapy: WFL for tasks assessed/performed Overall Cognitive Status: Within Functional Limits for tasks assessed                                          Exercises     Shoulder Instructions       General Comments VSS, reports plan to dc to her daughters home for 24/7 support    Pertinent Vitals/ Pain  Pain Assessment: No/denies pain Pain Score: 0-No pain Pain Location: back Pain Descriptors / Indicators: Discomfort Pain Intervention(s): Monitored during session  Home Living                                          Prior Functioning/Environment              Frequency  Min 2X/week        Progress Toward Goals  OT Goals(current goals can now be found in the care plan section)  Progress towards OT goals: Progressing toward goals  Acute Rehab OT Goals Patient Stated Goal: get back to living  independently OT Goal Formulation: With patient  Plan Discharge plan remains appropriate;Frequency remains appropriate    Co-evaluation                 AM-PAC OT "6 Clicks" Daily Activity     Outcome Measure   Help from another person eating meals?: None Help from another person taking care of personal grooming?: A Little Help from another person toileting, which includes using toliet, bedpan, or urinal?: A Little Help from another person bathing (including washing, rinsing, drying)?: A Little Help from another person to put on and taking off regular upper body clothing?: A Little Help from another person to put on and taking off regular lower body clothing?: A Little 6 Click Score: 19    End of Session Equipment Utilized During Treatment: Rolling walker  OT Visit Diagnosis: Unsteadiness on feet (R26.81);Muscle weakness (generalized) (M62.81)   Activity Tolerance Patient tolerated treatment well   Patient Left in chair;with call bell/phone within reach   Nurse Communication Mobility status        Time: 8657-8469 OT Time Calculation (min): 13 min  Charges: OT General Charges $OT Visit: 1 Visit OT Treatments $Self Care/Home Management : 8-22 mins  Delight Stare, [place] Pager [phone] Office [phone]    Delight Stare 10/05/2019, 2:18 PM

## 2019-10-05 NOTE — Progress Notes (Signed)
[facility] PROGRESS NOTE  Damani Rando VOZ:[id number] DOB: Aug 20, 1941 DOA: 09/21/2019 PCP: Frances Maywood, FNP  Brief summary   78 year old female admitted 9/30 to [facility] with 30lb weight gain in 6 weeks & progressive renal failure, tx to [facility] on 10/2 in the setting of progressive cardiorenal syndrome, hypotension, and worsening acute on chronic renal failure.The patient required dopamine infusion in the ICU. She had a HD catheter placed, and she underwent CRRT from 09/23/2021 09/27/2019 until the catheter clotted off. As her FENa was low and consistent with cardiorenal syndrome or acute glomerulonephropathy. Urine immunofixation and UPEP were performed. Results are concerning multiple myeloma.  Hematology Oncology was consulted on 09/28/2019.Bone scan was performed on 09/28/2019 which was unremarkable except diffuse osteopenia.  Oncology recommended bone marrow biopsy which was performed by Dr. Levan Hurst of IR on 09/30/19.  She was then diagnosed with right upper extremity cellulitis on 09/29/2019 and started on vancomycin and Zosyn which were de-escalated to IV Ancef on 10/01/2019.  Assessment/Plan:  Acute renal failure superimposed on chronic kidney disease -IV: Baseline creatinine/renal function unclear though at one time reported as CKD IV- followed by nephrologist pre admit): Acute kidney injury likely from acute cardiorenal syndrome versus ischemic ATN- was also on ARB prior to admit.found to have MM.Developedanuria/oliguria with worsening azotemia and hyperkalemia. started CRRT on 10/3 early AM - stopped on 10/6.  Patient is recent hemodialysis was on 09/29/2019.  Hemodialysis was held in anticipation that her renal function will improve however creatinine started to climb with decreasing urine output so she underwent another dialysis on 10/04/2019. Nephrology has consulted vascular surgery for [facility] as well as AV graft.  Vascular surgery has scheduled her for left  brachiocephalic fistula on Friday.  Appreciate both nephrology and vascular help and defer management to them.  Acute hypoxic respiratory failure secondary to acute on Chronic diastolic HF/bilateral pleural effusion/acute pulmonary edema: With transudative bilateralpleural effusions and pulmonary edema. I liter removed in thoracentesis. Monitor volume status. The patient is stable on room air. Echocardiogram performed on 10/1/2020demonstrated EF of 42-59% and diastolic dysfunction.  Bilateral pleural effusion could still be due to diastolic congestive heart failure. -on lasix 80 mg bid.  She saturating well above 90% on room air.  Cellulitis of the right arm: Right arm was swollen and was found to be negative for DVT by ultrasound on 10/7.   Patient initially was started on vancomycin and Zosyn on 09/29/2019 and this was switched to IV Ancef on 10/01/2019.  Her pain, tenderness and warmth has improved significantly.  Continue IV Ancef for total of 7 days.   Hypotension: Blood pressures are improved with midodrine 10 mg tid. - Monitor. BP  Light Chain Disease: Concern for multiple myeloma. Hematology/Oncology is following.  Dr. Hazeline Junker note from this morning noted. - BM biopsy was performed by Dr. Levan Hurst of IR 09/30/19.  Results are still pending.  Hx hypertension: The patient had been on multiple antihypertensives at home. Marland Kitchen Antihypertensives are being held due to the patient's hypotension since admission. Goal MAP remains >65.  Now her blood pressure has started to get better.  We will watch closely and resume antihypertensives as needed.  Mild Macrocytic anemia: Hgb 9.5 on 09/25/19 -->8.9 -->8.8. likely chronic due to progressive CKD: There is no evidence of bleeding.  Hemoglobin remains a stable. -Monitor and transfuse for hemoglobin less than 7.  Elevated LFTs: Resolved  Hypothyroidism: TSH minimally elevated at 6.8. Continue Synthroid as at home.  H/O seizure: Continue  phenytoin  H/O GERD: Continue PPI.  Severe Malnutrition: I appreciate nutrition's assistance.  Nutrition per their recommendations.  Deconditioning: The patient will likely require SNF or rehab at discharge.PT/OT ordered.  Code Status: full Family Communication: Discussed with patient.  Daughter was updated by nephrologist yesterday.  I did confirm this with the patient today. Disposition Plan: Needs snf medically cleared by nephrology.   Consultants:  PCCM  Nephrology  Hematology/Oncology  Procedures:  CRRT  HD  Thoracentesis  Bone marrow biopsy  Antibiotics: Anti-infectives (From admission, onward)   Start     Dose/Rate Route Frequency Ordered Stop   10/01/19 1300  ceFAZolin (ANCEF) IVPB 1 g/50 mL premix     1 g 100 mL/hr over 30 Minutes Intravenous Every 12 hours 10/01/19 1150     09/30/19 0100  piperacillin-tazobactam (ZOSYN) IVPB 3.375 g  Status:  Discontinued     3.375 g 12.5 mL/hr over 240 Minutes Intravenous Every 12 hours 09/29/19 1130 10/01/19 1127   09/29/19 1130  vancomycin variable dose per unstable renal function (pharmacist dosing)  Status:  Discontinued      Does not apply See admin instructions 09/29/19 1130 10/01/19 1150   09/29/19 1130  piperacillin-tazobactam (ZOSYN) IVPB 3.375 g     3.375 g 100 mL/hr over 30 Minutes Intravenous  Once 09/29/19 1115 09/29/19 2015   09/29/19 1130  vancomycin (VANCOCIN) 2,000 mg in sodium chloride 0.9 % 500 mL IVPB     2,000 mg 250 mL/hr over 120 Minutes Intravenous  Once 09/29/19 1115 09/29/19 2015       (indicate start date, and stop date if known)  HPI/Subjective: Patient seen and examined.  No new complaint.  She feels much better.  Right arm pain is improved.  Objective: Vitals:   10/05/19 0023 10/05/19 0331  BP: 126/62 127/60  Pulse: 87 94  Resp: 18 20  Temp: 97.8 F (36.6 C) 99.1 F (37.3 C)  SpO2: 96% 96%    Intake/Output Summary (Last 24 hours) at 10/05/2019 0825 Last data filed at  10/05/2019 0330 Gross per 24 hour  Intake 907.54 ml  Output 2950 ml  Net -2042.46 ml   Filed Weights   10/04/19 1250 10/04/19 1556 10/05/19 0717  Weight: 97.9 kg 95.8 kg 94.8 kg    Exam:  General exam: Appears calm and comfortable  Respiratory system: Clear to auscultation. Respiratory effort normal. Cardiovascular system: S1 & S2 heard, RRR. No JVD, murmurs, rubs, gallops or clicks.  +2 pitting edema bilateral lower extremity and +1 pitting edema bilateral upper extremity Gastrointestinal system: Abdomen is nondistended, soft and nontender. No organomegaly or masses felt. Normal bowel sounds heard. Central nervous system: Alert and oriented. No focal neurological deficits. Extremities: Right upper extremity still slightly more edematous than the left upper extremity.  Right upper extremity with minimal tenderness but no warmth or any open sores. Skin: No rashes, lesions or ulcers.  Psychiatry: Judgement and insight appear normal. Mood & affect appropriate.    Data Reviewed: Basic Metabolic Panel: Recent Labs  Lab 10/01/19 0258 10/02/19 0351 10/03/19 0355 10/04/19 0405 10/05/19 0508  NA 137 136 137 138 137  K 4.3 4.2 4.0 4.0 3.9  CL 99 98 98 98 99  CO2 27 28 27 27 26   GLUCOSE 98 92 96 85 86  BUN 35* 42* 48* 50* 34*  CREATININE 3.58* 3.96* 4.46* 4.61* 3.95*  CALCIUM 7.5* 7.6* 7.6* 7.8* 7.6*  PHOS 3.8 4.2 4.6 5.5* 5.2*   Liver Function Tests: Recent Labs  Lab 10/01/19 0258  10/02/19 0351 10/03/19 0355 10/04/19 0405 10/05/19 0508  ALBUMIN 1.3* 1.3* 1.3* 1.4* 1.4*   Recent Labs  Lab 10/01/19 1810  LIPASE 54*   No results for input(s): AMMONIA in the last 168 hours. CBC: Recent Labs  Lab 09/29/19 0314 10/01/19 0258 10/02/19 0351 10/03/19 0355 10/04/19 0405 10/05/19 0508  WBC 25.4* 12.1* 11.1* 11.2* 9.8 9.1  NEUTROABS 18.9*  --   --   --  6.2 5.6  HGB 8.9* 8.8* 8.8* 8.6* 8.9* 8.8*  HCT 26.7* 26.7* 26.5* 26.9* 27.4* 27.6*  MCV 101.9* 102.7* 102.3* 104.3*  102.6* 104.5*  PLT 141* 214 261 304 316 288   Cardiac Enzymes: No results for input(s): CKTOTAL, CKMB, CKMBINDEX, TROPONINI in the last 168 hours. BNP (last 3 results) Recent Labs    09/22/19 0121  BNP 1,148.0*    ProBNP (last 3 results) No results for input(s): PROBNP in the last 8760 hours.  CBG: No results for input(s): GLUCAP in the last 168 hours.  Recent Results (from the past 240 hour(s))  Body fluid culture     Status: None   Collection Time: 09/27/19  2:09 PM   Specimen: Body Fluid  Result Value Ref Range Status   Specimen Description FLUID RIGHT PLEURAL  Final   Special Requests NONE  Final   Gram Stain NO WBC SEEN NO ORGANISMS SEEN   Final   Culture   Final    NO GROWTH 3 DAYS Performed at [facility], [address], [place], [place] [address]    Report Status 10/01/2019 FINAL  Final  Culture, blood (routine x 2)     Status: None   Collection Time: 09/29/19  8:00 AM   Specimen: BLOOD LEFT ARM  Result Value Ref Range Status   Specimen Description BLOOD LEFT ARM  Final   Special Requests   Final    BOTTLES DRAWN AEROBIC AND ANAEROBIC Blood Culture adequate volume   Culture   Final    NO GROWTH 5 DAYS Performed at [facility], [place] [address] [place], [place] [address]    Report Status 10/04/2019 FINAL  Final  Culture, blood (routine x 2)     Status: None   Collection Time: 09/29/19  8:20 AM   Specimen: BLOOD  Result Value Ref Range Status   Specimen Description BLOOD LEFT ANTECUBITAL  Final   Special Requests   Final    BOTTLES DRAWN AEROBIC AND ANAEROBIC Blood Culture adequate volume   Culture   Final    NO GROWTH 5 DAYS Performed at [facility], [place] [address] [place], [place] [address]    Report Status 10/04/2019 FINAL  Final  Culture, Urine     Status: Abnormal   Collection Time: 09/29/19  8:09 PM   Specimen: Urine, Random  Result Value Ref Range Status   Specimen Description URINE, RANDOM  Final   Special Requests    Final    NONE Performed at [facility], [place] [address], [place], [place] [address]    Culture >=100,000 COLONIES/mL STAPHYLOCOCCUS LUGDUNENSIS (A)  Final   Report Status 10/01/2019 FINAL  Final   Organism ID, Bacteria STAPHYLOCOCCUS LUGDUNENSIS (A)  Final      Susceptibility   Staphylococcus lugdunensis - MIC*    CIPROFLOXACIN <=0.5 SENSITIVE Sensitive     GENTAMICIN <=0.5 SENSITIVE Sensitive     NITROFURANTOIN <=16 SENSITIVE Sensitive     OXACILLIN >=4 RESISTANT Resistant     TETRACYCLINE <=1 SENSITIVE Sensitive  VANCOMYCIN <=0.5 SENSITIVE Sensitive     TRIMETH/SULFA <=10 SENSITIVE Sensitive     CLINDAMYCIN >=8 RESISTANT Resistant     RIFAMPIN <=0.5 SENSITIVE Sensitive     Inducible Clindamycin NEGATIVE Sensitive     * >=100,000 COLONIES/mL STAPHYLOCOCCUS LUGDUNENSIS     Studies: Dg Bone Survey Met  Result Date: 10/03/2019 CLINICAL DATA:  Light chain disease. EXAM: METASTATIC BONE SURVEY COMPARISON:  Metastatic bone survey dated 09/28/2019 FINDINGS: There are no lytic lesions in the skeleton. There are severe arthritic changes of both shoulders with multiple loose bodies in the right glenohumeral joint. There are arthritic changes of both knees. Chronic degenerative facet arthritis in the cervical spine. Since the prior bone survey of 09/28/2019 the patient has developed small bilateral pleural effusions. Central line in place with the tip at the level of the carina in good position. IMPRESSION: 1. No lytic lesions in the skeleton. 2. New small bilateral pleural effusions. 3. Severe arthritic changes of both shoulders with multiple loose bodies. Electronically Signed   By: Lorriane Shire M.D.   On: 10/03/2019 17:04    Scheduled Meds: . Chlorhexidine Gluconate Cloth  6 each Topical Q0600  . darbepoetin (ARANESP) injection - NON-DIALYSIS  60 mcg Subcutaneous Q Sun-1800  . docusate sodium  100 mg Oral Daily  . feeding supplement (ENSURE ENLIVE)  237 mL Oral BID BM  . feeding  supplement (PRO-STAT SUGAR FREE 64)  30 mL Oral BID  . furosemide  80 mg Intravenous Q12H  . heparin  5,000 Units Subcutaneous Q8H  . levothyroxine  25 mcg Oral Q0600  . midodrine  10 mg Oral TID WC  . pantoprazole  40 mg Oral Daily  . phenytoin  300 mg Oral QHS  . sodium chloride flush  3 mL Intravenous Q12H   Continuous Infusions: . sodium chloride Stopped (10/05/19 0020)  .  ceFAZolin (ANCEF) IV Stopped (10/04/19 2241)    Principal Problem:   Acute renal failure superimposed on chronic kidney disease ([place]) Active Problems:   Protein-calorie malnutrition, severe (HCC)   Abnormal liver function   Anemia   Anasarca   Acute on chronic diastolic CHF (congestive heart failure) (HCC)   Cardiorenal syndrome   Acute renal failure (ARF) (HCC)   Pleural effusion   Leukocytosis   Multiple myeloma ([place])   Acute on chronic congestive heart failure ([place])  Time spent: 28 minutes  [facility] If 7PM-7AM, please contact night-coverage at [email or web address], password [place] 10/05/2019, 8:25 AM  LOS: 13 days

## 2019-10-05 NOTE — Progress Notes (Signed)
Renal Navigator called [facility] to follow up on referral and was told that they have not received the HepB Core Antibody. Renal Navigator explained that it is within the Hepatitis panel. Admissions Coordinator then stated that she had it and the referral would be submitted to the clinic. Renal Navigator stated expectation to have an answer about seat schedule by tomorrow as this referral was initially submitted yesterday. Renal Navigator received message from CSW/A. Hill who states that patient's daughter Danton Clap wants an update on OP HD referral. Renal Navigator called [facility] who states, "I want to know when the referral is going to be made!" Renal Navigator explained that the referral was made yesterday after we spoke, but that Renal Navigator just contacted [facility] who had not yet put in the referral. Renal Navigator is hopeful for a schedule tomorrow. Patient's daughter states her mother has been in the hospital too long and that she will leave AMA soon. Renal Navigator provided support and encouraged patient's daughter to keep her mother in the hospital in order to ger her permanent access, which appears to be scheduled for Friday, and OP HD schedule to be prepared for a safe discharge. Renal Navigator was able to build some rapport with patient's daughter by discussing her nursing background and desire to take great care of her mother. Renal Navigator talked about how sweet her mother is. Patient's daughter was calm and appreciative by end of the conversation and thanked Renal Navigator. Renal Navigator will update patient's daughter as soon as Navigator has heard about seat schedule from [facility].  Alphonzo Cruise, [place] Renal Navigator [phone]

## 2019-10-05 NOTE — Progress Notes (Signed)
Renal Navigator faxed HepB surface antibody result to [facility] to complete referral and will follow closely.  Alphonzo Cruise, [place] Renal Navigator [phone]

## 2019-10-06 LAB — RENAL FUNCTION PANEL
Albumin: 1.4 g/dL — ABNORMAL LOW (ref 3.5–5.0)
Anion gap: 14 (ref 5–15)
BUN: 42 mg/dL — ABNORMAL HIGH (ref 8–23)
CO2: 26 mmol/L (ref 22–32)
Calcium: 7.5 mg/dL — ABNORMAL LOW (ref 8.9–10.3)
Chloride: 96 mmol/L — ABNORMAL LOW (ref 98–111)
Creatinine, Ser: 4.37 mg/dL — ABNORMAL HIGH (ref 0.44–1.00)
GFR calc Af Amer: 11 mL/min — ABNORMAL LOW (ref >60)
GFR calc non Af Amer: 9 mL/min — ABNORMAL LOW (ref >60)
Glucose, Bld: 102 mg/dL — ABNORMAL HIGH (ref 70–99)
Phosphorus: 5.9 mg/dL — ABNORMAL HIGH (ref 2.5–4.6)
Potassium: 3.9 mmol/L (ref 3.5–5.1)
Sodium: 136 mmol/L (ref 135–145)

## 2019-10-06 MED ORDER — HEPARIN SODIUM (PORCINE) 1000 UNIT/ML IJ SOLN
INTRAMUSCULAR | Status: AC
  Filled 2019-10-06: qty 3

## 2019-10-06 MED ORDER — MUPIROCIN 2 % EX OINT
1.0000 "application " | TOPICAL_OINTMENT | Freq: Two times a day (BID) | CUTANEOUS | Status: DC
  Administered 2019-10-07: 1 via NASAL
  Filled 2019-10-06: qty 22

## 2019-10-06 NOTE — Progress Notes (Signed)
[facility] PROGRESS NOTE  Sophia Mcmahon EHM:[id number] DOB: [birth year] DOA: 09/21/2019 PCP: Frances Maywood, FNP  Brief summary   78 year old female admitted 9/30 to [facility] with 30lb weight gain in 6 weeks & progressive renal failure, tx to [facility] on 10/2 in the setting of progressive cardiorenal syndrome, hypotension, and worsening acute on chronic renal failure.The patient required dopamine infusion in the ICU. She had a HD catheter placed, and she underwent CRRT from 09/23/2021 09/27/2019 until the catheter clotted off. As her FENa was low and consistent with cardiorenal syndrome or acute glomerulonephropathy. Urine immunofixation and UPEP were performed. Results are concerning multiple myeloma.  Hematology Oncology was consulted on 09/28/2019.Bone scan was performed on 09/28/2019 which was unremarkable except diffuse osteopenia.  Oncology recommended bone marrow biopsy which was performed by Dr. Levan Hurst of IR on 09/30/19.  She was then diagnosed with right upper extremity cellulitis on 09/29/2019 and started on vancomycin and Zosyn which were de-escalated to IV Ancef on 10/01/2019.  Assessment/Plan:  Acute renal failure superimposed on chronic kidney disease -IV: Baseline creatinine/renal function unclear though at one time reported as CKD IV- followed by nephrologist pre admit): Acute kidney injury likely from acute cardiorenal syndrome versus ischemic ATN- was also on ARB prior to admit.found to have MM.Developedanuria/oliguria with worsening azotemia and hyperkalemia. started CRRT on 10/3 early AM - stopped on 10/6.  Patient is recent hemodialysis was on 09/29/2019.  Hemodialysis was held in anticipation that her renal function will improve however creatinine started to climb with decreasing urine output so she underwent another dialysis on 10/04/2019. Nephrology has consulted vascular surgery for [facility] as well as AV graft.  Vascular surgery has scheduled her for left  brachiocephalic fistula on Friday.  Appreciate both nephrology and vascular help and defer management to them.  Acute hypoxic respiratory failure secondary to acute on Chronic diastolic HF/bilateral pleural effusion/acute pulmonary edema: With transudative bilateralpleural effusions and pulmonary edema. I liter removed in thoracentesis. Monitor volume status. The patient is stable on room air. Echocardiogram performed on 10/1/2020demonstrated EF of 36-62% and diastolic dysfunction.  Bilateral pleural effusion could still be due to diastolic congestive heart failure. -on lasix 80 mg bid.  She saturating well above 90% on room air.  Cellulitis of the right arm: Right arm was swollen and was found to be negative for DVT by ultrasound on 10/7.   Patient initially was started on vancomycin and Zosyn on 09/29/2019 and this was switched to IV Ancef on 10/01/2019.  This has greatly improved.  No more tenderness.  Continue Ancef for total of 7 days.   Hypotension: Blood pressures are improved with midodrine 10 mg tid. - Monitor. BP  Light Chain Disease: Concern for multiple myeloma. Hematology/Oncology is following.  Dr. Hazeline Junker note from this morning noted. - BM biopsy was performed by Dr. Levan Hurst of IR 09/30/19.  Results are still pending.  Hx hypertension: The patient had been on multiple antihypertensives at home. Marland Kitchen Antihypertensives are being held due to the patient's hypotension since admission. Goal MAP remains >65.  Now her blood pressure has started to get better.  We will watch closely and resume antihypertensives as needed.  Mild Macrocytic anemia: Hgb 9.5 on 09/25/19 -->8.9 -->8.8. likely chronic due to progressive CKD: There is no evidence of bleeding.  Hemoglobin remains a stable. -Monitor and transfuse for hemoglobin less than 7.  Elevated LFTs: Resolved  Hypothyroidism: TSH minimally elevated at 6.8. Continue Synthroid as at home.  H/O seizure: Continue phenytoin  H/O  GERD:  Continue PPI.  Severe Malnutrition: I appreciate nutrition's assistance.  Nutrition per their recommendations.  Deconditioning: The patient will likely require SNF or rehab at discharge.PT/OT ordered.  Code Status: full Family Communication: Discussed with patient.  Per patient, her daughter has been updated by nephrologist frequently. Disposition Plan: Needs snf once medically cleared by nephrology.   Consultants:  PCCM  Nephrology  Hematology/Oncology  Procedures:  CRRT  HD  Thoracentesis  Bone marrow biopsy  Antibiotics: Anti-infectives (From admission, onward)   Start     Dose/Rate Route Frequency Ordered Stop   10/01/19 1300  ceFAZolin (ANCEF) IVPB 1 g/50 mL premix     1 g 100 mL/hr over 30 Minutes Intravenous Every 12 hours 10/01/19 1150     09/30/19 0100  piperacillin-tazobactam (ZOSYN) IVPB 3.375 g  Status:  Discontinued     3.375 g 12.5 mL/hr over 240 Minutes Intravenous Every 12 hours 09/29/19 1130 10/01/19 1127   09/29/19 1130  vancomycin variable dose per unstable renal function (pharmacist dosing)  Status:  Discontinued      Does not apply See admin instructions 09/29/19 1130 10/01/19 1150   09/29/19 1130  piperacillin-tazobactam (ZOSYN) IVPB 3.375 g     3.375 g 100 mL/hr over 30 Minutes Intravenous  Once 09/29/19 1115 09/29/19 2015   09/29/19 1130  vancomycin (VANCOCIN) 2,000 mg in sodium chloride 0.9 % 500 mL IVPB     2,000 mg 250 mL/hr over 120 Minutes Intravenous  Once 09/29/19 1115 09/29/19 2015       (indicate start date, and stop date if known)  HPI/Subjective: Patient seen and examined.  She has no complaints.  Objective: Vitals:   10/06/19 1000 10/06/19 1030  BP: (!) 108/57 (!) 104/28  Pulse: 91 92  Resp: 19 20  Temp:    SpO2:      Intake/Output Summary (Last 24 hours) at 10/06/2019 1103 Last data filed at 10/06/2019 0436 Gross per 24 hour  Intake 873.65 ml  Output 1100 ml  Net -226.35 ml   Filed Weights   10/05/19 0717  10/06/19 0637 10/06/19 0735  Weight: 94.8 kg 94.3 kg 95.3 kg    Exam:  General exam: Appears calm and comfortable  Respiratory system: Clear to auscultation. Respiratory effort normal. Cardiovascular system: S1 & S2 heard, RRR. No JVD, murmurs, rubs, gallops or clicks.  +2 pitting edema bilateral lower extremity and +1 pitting edema bilateral upper extremity Gastrointestinal system: Abdomen is nondistended, soft and nontender. No organomegaly or masses felt. Normal bowel sounds heard. Central nervous system: Alert and oriented. No focal neurological deficits. Extremities: Symmetric 5 x 5 power. Skin: No rashes, lesions or ulcers.  Psychiatry: Judgement and insight appear normal. Mood & affect appropriate.   Data Reviewed: Basic Metabolic Panel: Recent Labs  Lab 10/02/19 0351 10/03/19 0355 10/04/19 0405 10/05/19 0508 10/06/19 0435  NA 136 137 138 137 136  K 4.2 4.0 4.0 3.9 3.9  CL 98 98 98 99 96*  CO2 28 27 27 26 26   GLUCOSE 92 96 85 86 102*  BUN 42* 48* 50* 34* 42*  CREATININE 3.96* 4.46* 4.61* 3.95* 4.37*  CALCIUM 7.6* 7.6* 7.8* 7.6* 7.5*  PHOS 4.2 4.6 5.5* 5.2* 5.9*   Liver Function Tests: Recent Labs  Lab 10/02/19 0351 10/03/19 0355 10/04/19 0405 10/05/19 0508 10/06/19 0435  ALBUMIN 1.3* 1.3* 1.4* 1.4* 1.4*   Recent Labs  Lab 10/01/19 1810  LIPASE 54*   No results for input(s): AMMONIA in the last 168 hours. CBC: Recent  Labs  Lab 10/01/19 0258 10/02/19 0351 10/03/19 0355 10/04/19 0405 10/05/19 0508  WBC 12.1* 11.1* 11.2* 9.8 9.1  NEUTROABS  --   --   --  6.2 5.6  HGB 8.8* 8.8* 8.6* 8.9* 8.8*  HCT 26.7* 26.5* 26.9* 27.4* 27.6*  MCV 102.7* 102.3* 104.3* 102.6* 104.5*  PLT 214 261 304 316 288   Cardiac Enzymes: No results for input(s): CKTOTAL, CKMB, CKMBINDEX, TROPONINI in the last 168 hours. BNP (last 3 results) Recent Labs    09/22/19 0121  BNP 1,148.0*    ProBNP (last 3 results) No results for input(s): PROBNP in the last 8760  hours.  CBG: No results for input(s): GLUCAP in the last 168 hours.  Recent Results (from the past 240 hour(s))  Body fluid culture     Status: None   Collection Time: 09/27/19  2:09 PM   Specimen: Body Fluid  Result Value Ref Range Status   Specimen Description FLUID RIGHT PLEURAL  Final   Special Requests NONE  Final   Gram Stain NO WBC SEEN NO ORGANISMS SEEN   Final   Culture   Final    NO GROWTH 3 DAYS Performed at [facility], [address] [place], [place] [address]    Report Status 10/01/2019 FINAL  Final  Culture, blood (routine x 2)     Status: None   Collection Time: 09/29/19  8:00 AM   Specimen: BLOOD LEFT ARM  Result Value Ref Range Status   Specimen Description BLOOD LEFT ARM  Final   Special Requests   Final    BOTTLES DRAWN AEROBIC AND ANAEROBIC Blood Culture adequate volume   Culture   Final    NO GROWTH 5 DAYS Performed at [facility], [place] [address], [place], [place] [address]    Report Status 10/04/2019 FINAL  Final  Culture, blood (routine x 2)     Status: None   Collection Time: 09/29/19  8:20 AM   Specimen: BLOOD  Result Value Ref Range Status   Specimen Description BLOOD LEFT ANTECUBITAL  Final   Special Requests   Final    BOTTLES DRAWN AEROBIC AND ANAEROBIC Blood Culture adequate volume   Culture   Final    NO GROWTH 5 DAYS Performed at [facility], [place] [address], [place], [place] [address]    Report Status 10/04/2019 FINAL  Final  Culture, Urine     Status: Abnormal   Collection Time: 09/29/19  8:09 PM   Specimen: Urine, Random  Result Value Ref Range Status   Specimen Description URINE, RANDOM  Final   Special Requests   Final    NONE Performed at [facility], [place] [address] [place], [place] [address]    Culture >=100,000 COLONIES/mL STAPHYLOCOCCUS LUGDUNENSIS (A)  Final   Report Status 10/01/2019 FINAL  Final   Organism ID, Bacteria STAPHYLOCOCCUS LUGDUNENSIS (A)  Final      Susceptibility    Staphylococcus lugdunensis - MIC*    CIPROFLOXACIN <=0.5 SENSITIVE Sensitive     GENTAMICIN <=0.5 SENSITIVE Sensitive     NITROFURANTOIN <=16 SENSITIVE Sensitive     OXACILLIN >=4 RESISTANT Resistant     TETRACYCLINE <=1 SENSITIVE Sensitive     VANCOMYCIN <=0.5 SENSITIVE Sensitive     TRIMETH/SULFA <=10 SENSITIVE Sensitive     CLINDAMYCIN >=8 RESISTANT Resistant     RIFAMPIN <=0.5 SENSITIVE Sensitive     Inducible Clindamycin NEGATIVE Sensitive     * >=100,000 COLONIES/mL STAPHYLOCOCCUS  LUGDUNENSIS     Studies: Vas [place] Upper Ext Vein Mapping (pre-op Avf)  Result Date: 10/05/2019 UPPER EXTREMITY VEIN MAPPING  Indications: Pre-access. Comparison Study: No prior study. Performing Technologist: Maudry Mayhew MHA, RDMS, RVT, RDCS  Examination Guidelines: A complete evaluation includes B-mode imaging, spectral Doppler, color Doppler, and power Doppler as needed of all accessible portions of each vessel. Bilateral testing is considered an integral part of a complete examination. Limited examinations for reoccurring indications may be performed as noted. +-----------------+-------------+----------+---------+ Left Cephalic    Diameter (cm)Depth (cm)Findings  +-----------------+-------------+----------+---------+ Shoulder             0.30        0.69             +-----------------+-------------+----------+---------+ Prox upper arm       0.32        0.81             +-----------------+-------------+----------+---------+ Mid upper arm        0.31        0.59   branching +-----------------+-------------+----------+---------+ Dist upper arm       0.36        0.75             +-----------------+-------------+----------+---------+ Antecubital fossa    0.38        0.34   branching +-----------------+-------------+----------+---------+ Prox forearm         0.26        0.63             +-----------------+-------------+----------+---------+ Mid forearm          0.18         0.56   branching +-----------------+-------------+----------+---------+ Dist forearm         0.21        0.22             +-----------------+-------------+----------+---------+ Wrist                0.24        0.28             +-----------------+-------------+----------+---------+ +-----------------+-------------+----------+--------------+ Left Basilic     Diameter (cm)Depth (cm)   Findings    +-----------------+-------------+----------+--------------+ Prox upper arm       0.51                              +-----------------+-------------+----------+--------------+ Mid upper arm        0.45                              +-----------------+-------------+----------+--------------+ Dist upper arm       0.33                 branching    +-----------------+-------------+----------+--------------+ Antecubital fossa    0.18                 branching    +-----------------+-------------+----------+--------------+ Prox forearm         0.12                              +-----------------+-------------+----------+--------------+ Mid forearm                             not visualized +-----------------+-------------+----------+--------------+ Distal forearm  not visualized +-----------------+-------------+----------+--------------+ Wrist                                   not visualized +-----------------+-------------+----------+--------------+ *See table(s) above for measurements and observations.  Diagnosing physician: Deitra Mayo MD Electronically signed by Deitra Mayo MD on 10/05/2019 at 12:00:09 PM.    Final     Scheduled Meds: . Chlorhexidine Gluconate Cloth  6 each Topical Q0600  . darbepoetin (ARANESP) injection - NON-DIALYSIS  60 mcg Subcutaneous Q Sun-1800  . docusate sodium  100 mg Oral Daily  . feeding supplement (ENSURE ENLIVE)  237 mL Oral BID BM  . feeding supplement (PRO-STAT SUGAR FREE 64)  30 mL Oral BID  .  furosemide  80 mg Intravenous Q12H  . heparin  5,000 Units Subcutaneous Q8H  . levothyroxine  25 mcg Oral Q0600  . midodrine  10 mg Oral TID WC  . pantoprazole  40 mg Oral Daily  . phenytoin  300 mg Oral QHS  . sodium chloride flush  3 mL Intravenous Q12H   Continuous Infusions: . sodium chloride Stopped (10/06/19 0049)  .  ceFAZolin (ANCEF) IV Stopped (10/05/19 2138)    Principal Problem:   Acute renal failure superimposed on chronic kidney disease ([place]) Active Problems:   Protein-calorie malnutrition, severe (HCC)   Abnormal liver function   Anemia   Anasarca   Acute on chronic diastolic CHF (congestive heart failure) (HCC)   Cardiorenal syndrome   Acute renal failure (ARF) (HCC)   Pleural effusion   Leukocytosis   Multiple myeloma ([place])   Acute on chronic congestive heart failure ([place])  Time spent: 27 minutes  [place]  [facility] If 7PM-7AM, please contact night-coverage at [email or web address], password [facility] 10/06/2019, 11:03 AM  LOS: 14 days

## 2019-10-06 NOTE — Progress Notes (Signed)
VAST consulted to remove [facility] after dialysis tx. Spoke with unit nurse, Nira Retort regarding Scot Dock, MD (vascular surgery) note to leave [facility] in place until day of surgery. Nira Retort, RN spoke with Scot Dock, MD who confirmed he would like the [facility] left in place until tomorrow.

## 2019-10-06 NOTE — Progress Notes (Signed)
   VASCULAR SURGERY ASSESSMENT & PLAN:   ESRD: The patient is currently on dialysis via her temporary catheter in the right IJ.  She appears to be a candidate for a left brachiocephalic fistula.  This is her best chance for a fistula.  Her right arm swelling and cellulitis has improved significantly.  I will have them remove her IV from the left arm but would be reluctant to place an IV in the right arm given that she is just recovering from cellulitis here.  I think it would be reasonable to use her temporary right IJ catheter for IV access for now.  I have written preop orders.   SUBJECTIVE:   Currently on hemodialysis.  No complaints.  PHYSICAL EXAM:   Vitals:   10/05/19 2042 10/06/19 0035 10/06/19 0436 10/06/19 0637  BP: 121/72 (!) 124/55 (!) 119/55   Pulse: 84 83 82   Resp: '20 20 20   '$ Temp: 98 F (36.7 C) 98.1 F (36.7 C) 98.3 F (36.8 C)   TempSrc: Oral Oral Oral   SpO2: 100% 97% 97%   Weight:    94.3 kg  Height:       She has an IV in her left forearm.  I have written to have this removed. Her swelling and cellulitis in the right arm has markedly improved.  Her previous venous duplex scan showed no evidence of DVT or superficial thrombophlebitis.  LABS:   Lab Results  Component Value Date   WBC 9.1 10/05/2019   HGB 8.8 (L) 10/05/2019   HCT 27.6 (L) 10/05/2019   MCV 104.5 (H) 10/05/2019   PLT 288 10/05/2019   Lab Results  Component Value Date   CREATININE 4.37 (H) 10/06/2019     PROBLEM LIST:    Principal Problem:   Acute renal failure superimposed on chronic kidney disease (HCC) Active Problems:   Protein-calorie malnutrition, severe (HCC)   Abnormal liver function   Anemia   Anasarca   Acute on chronic diastolic CHF (congestive heart failure) (HCC)   Cardiorenal syndrome   Acute renal failure (ARF) (HCC)   Pleural effusion   Leukocytosis   Multiple myeloma (HCC)   Acute on chronic congestive heart failure (HCC)   CURRENT MEDS:   .  Chlorhexidine Gluconate Cloth  6 each Topical Q0600  . darbepoetin (ARANESP) injection - NON-DIALYSIS  60 mcg Subcutaneous Q Sun-1800  . docusate sodium  100 mg Oral Daily  . feeding supplement (ENSURE ENLIVE)  237 mL Oral BID BM  . feeding supplement (PRO-STAT SUGAR FREE 64)  30 mL Oral BID  . furosemide  80 mg Intravenous Q12H  . heparin  5,000 Units Subcutaneous Q8H  . levothyroxine  25 mcg Oral Q0600  . midodrine  10 mg Oral TID WC  . pantoprazole  40 mg Oral Daily  . phenytoin  300 mg Oral QHS  . sodium chloride flush  3 mL Intravenous Q12H    Deitra Mayo Beeper: [phone] Office: [phone] 10/06/2019

## 2019-10-06 NOTE — Progress Notes (Signed)
  Speech Language Pathology Treatment: Dysphagia  Patient Details Name: Sophia Mcmahon MRN: [id number] DOB: 1941/02/24 Today's Date: 10/06/2019 Time: 9136-8599 SLP Time Calculation (min) (ACUTE ONLY): 13 min  Assessment / Plan / Recommendation Clinical Impression  Patient was seen for dysphagia therapy. Pt continues to tolerate regular diet with thin liquids. She is currently having nausea and RN has just started administration of nausea medication. Pt and RN report pt tolerating diet well with no s/sx of aspiration. Pt also reports she has no further esophageal symptoms. ST will sign off at this time. No further services are needed.    HPI HPI: 78 year old female admitted 9/30 to [facility] with 30lb weight gain in 6 weeks & progressive renal failure, tx [facility] on [facility] in the setting of progressive cardiorenal syndrome, hypotension, and worsening acute on chronic renal failure.The patient required dopamine infusion in the ICU. She had a HD catheter placed, and she underwent CRRT until the catheter clotted off. As her FENa was low and consistent with cardiorenal syndrome or acute glomerulonephropathy urine immunofixation and UPEP were performed. Results are consistent with multiple myeloma.   Hematology Oncology was consulted on 09/28/2019.Bone scan was performed on 09/28/2019. I have discussed this with the patient's daughter. She stated that she will convey this to oncology today.  Chest xray was showing stable opacification at the bases from atelectasis/PNA and small pleural effusions.        SLP Plan  Discharge SLP treatment due to (comment);All goals met       Recommendations  Diet recommendations: Regular;Thin liquid Liquids provided via: Cup;Straw Medication Administration: Whole meds with liquid Supervision: Patient able to self feed;Intermittent supervision to cue for compensatory strategies Compensations: Slow rate;Small sips/bites Postural Changes and/or Swallow  Maneuvers: Seated upright 90 degrees;Upright 30-60 min after meal                Oral Care Recommendations: Oral care BID Follow up Recommendations: None SLP Visit Diagnosis: Dysphagia, unspecified (R13.10) Plan: Discharge SLP treatment due to (comment);All goals met       [place], MA, CCC-SLP 10/06/2019 1:17 PM

## 2019-10-06 NOTE — Progress Notes (Signed)
  Spoke with Scot Dock MD earlier today regarding orders of HD IJ. Per pt statement MD told her that it was going to be removed tomorrow.  Scot Dock MD stated to keep it today until tomorrow.  IV team aware.

## 2019-10-06 NOTE — Procedures (Signed)
I was present at this dialysis session. I have reviewed the session itself and made appropriate changes.   Vital signs in last 24 hours:  Temp:  [98 F (36.7 C)-98.4 F (36.9 C)] 98.3 F (36.8 C) (10/15 0436) Pulse Rate:  [79-86] 82 (10/15 0436) Resp:  [17-20] 20 (10/15 0436) BP: (118-124)/(55-72) 119/55 (10/15 0436) SpO2:  [92 %-100 %] 97 % (10/15 0436) Weight:  [94.3 kg] 94.3 kg (10/15 0637) Weight change: -3.098 kg Filed Weights   10/04/19 1556 10/05/19 0717 10/06/19 0637  Weight: 95.8 kg 94.8 kg 94.3 kg    Recent Labs  Lab 10/06/19 0435  NA 136  K 3.9  CL 96*  CO2 26  GLUCOSE 102*  BUN 42*  CREATININE 4.37*  CALCIUM 7.5*  PHOS 5.9*    Recent Labs  Lab 10/03/19 0355 10/04/19 0405 10/05/19 0508  WBC 11.2* 9.8 9.1  NEUTROABS  --  6.2 5.6  HGB 8.6* 8.9* 8.8*  HCT 26.9* 27.4* 27.6*  MCV 104.3* 102.6* 104.5*  PLT 304 316 288    Scheduled Meds: . Chlorhexidine Gluconate Cloth  6 each Topical Q0600  . darbepoetin (ARANESP) injection - NON-DIALYSIS  60 mcg Subcutaneous Q Sun-1800  . docusate sodium  100 mg Oral Daily  . feeding supplement (ENSURE ENLIVE)  237 mL Oral BID BM  . feeding supplement (PRO-STAT SUGAR FREE 64)  30 mL Oral BID  . furosemide  80 mg Intravenous Q12H  . heparin  5,000 Units Subcutaneous Q8H  . levothyroxine  25 mcg Oral Q0600  . midodrine  10 mg Oral TID WC  . pantoprazole  40 mg Oral Daily  . phenytoin  300 mg Oral QHS  . sodium chloride flush  3 mL Intravenous Q12H   Continuous Infusions: . sodium chloride Stopped (10/06/19 0049)  .  ceFAZolin (ANCEF) IV Stopped (10/05/19 2138)   PRN Meds:.sodium chloride, acetaminophen, alum & mag hydroxide-simeth, ondansetron (ZOFRAN) IV, sodium chloride flush, sodium chloride flush    Assessment/Plan:  1. AKI/CKD stage3 (Cr was 1.5 in June 2020)in setting of cardiorenal syndrome vs ischemic ATN vs. Light chain disease/plasma cell disorder. She developed oliguria and worsening azotemia and  hyperkalemia with hypotension and started on CVVHD on 09/24/19 and had intermittent HD on 09/27/19 and 09/29/19 with some improvement of UOP but climbing BUN/Cr.  1. Willcontinue with HD on TTS schedule. 2. UOP improving but BUN/Cr continue to climb 3. VVS for [facility] and AVF/AVG placement as well tomorrow 4. Outpatient HD to be arranged at [facility] 5. Hopefully she can leave after AVF/AVG placement 6. Family is very involved and home therapies will likely be an excellent option for them and I spoke with her daughter Danton Clap who is a dialysis nurse and will arrange PD catheter placement as an outpatient in [place]. 2. Acute on chronic CHF- 16 liters negative since admission but still has some edema. Breathing is comfortable 3. Hypotension- on midodrine. 4. Anemia of CKD- on ESA and s/p IV iron 5. Elevated light chains- concern forlight chain multiplemyelomabut light chain specification is pending. Dr. Lavone Orn following andbone surveynegative for lytic lesions. 6. Cellulitis of right arm- negative for DVT and treated with vanc/zosyn then changed to ancef. Clinically improving.  7. Vascular access- will need TDC and AVF/AVG placement 10/07/19.  Will d/c HD catheter today after HD.  Donetta Potts,  MD 10/06/2019, 8:13 AM

## 2019-10-06 NOTE — Progress Notes (Signed)
Patient has been accepted at [facility] for OP HD treatment on a MWF schedule with a seat time of 1:00pm. Renal Navigator notified Nephrology/Dr. Marval Regal. Team is tentatively planning on discharge tomorrow, 10/07/19 after permanent access surgery. Patient is having HD today and per Dr. Marval Regal, patient can wait until Monday for her next treatment. Therefore, patient will start in the OP HD clinic on Monday, 10/10/19. She needs to arrive at 12:00pm to complete intake paperwork prior to first treatment. Renal Navigator called patient's daughter/Alice to inform of OP HD seat schedule and tentative plan. She states understanding, no questions, and appreciation.  Alphonzo Cruise, [place] Renal Navigator [phone]

## 2019-10-07 ENCOUNTER — Encounter (HOSPITAL_COMMUNITY): Payer: Self-pay | Admitting: Critical Care Medicine

## 2019-10-07 ENCOUNTER — Inpatient Hospital Stay (HOSPITAL_COMMUNITY): Payer: Medicare Other

## 2019-10-07 ENCOUNTER — Inpatient Hospital Stay (HOSPITAL_COMMUNITY): Payer: Medicare Other | Admitting: Critical Care Medicine

## 2019-10-07 ENCOUNTER — Encounter (HOSPITAL_COMMUNITY)
Admission: EM | Disposition: A | Discharge status: Home Health | Payer: Self-pay | Source: Home / Self Care | Attending: Family Medicine

## 2019-10-07 DIAGNOSIS — N185 Chronic kidney disease, stage 5: Secondary | ICD-10-CM

## 2019-10-07 HISTORY — PX: AV FISTULA PLACEMENT: SHX1204

## 2019-10-07 HISTORY — PX: EXCHANGE OF A DIALYSIS CATHETER: SHX5818

## 2019-10-07 LAB — RENAL FUNCTION PANEL
Albumin: 1.5 g/dL — ABNORMAL LOW (ref 3.5–5.0)
Anion gap: 9 (ref 5–15)
BUN: 25 mg/dL — ABNORMAL HIGH (ref 8–23)
CO2: 29 mmol/L (ref 22–32)
Calcium: 7.3 mg/dL — ABNORMAL LOW (ref 8.9–10.3)
Chloride: 98 mmol/L (ref 98–111)
Creatinine, Ser: 3.44 mg/dL — ABNORMAL HIGH (ref 0.44–1.00)
GFR calc Af Amer: 14 mL/min — ABNORMAL LOW (ref >60)
GFR calc non Af Amer: 12 mL/min — ABNORMAL LOW (ref >60)
Glucose, Bld: 85 mg/dL (ref 70–99)
Phosphorus: 4.4 mg/dL (ref 2.5–4.6)
Potassium: 3.6 mmol/L (ref 3.5–5.1)
Sodium: 136 mmol/L (ref 135–145)

## 2019-10-07 LAB — SURGICAL PCR SCREEN
MRSA, PCR: NEGATIVE
Staphylococcus aureus: NEGATIVE

## 2019-10-07 LAB — SURGICAL PATHOLOGY

## 2019-10-07 LAB — SARS CORONAVIRUS 2 (TAT 6-24 HRS): SARS Coronavirus 2: NEGATIVE

## 2019-10-07 SURGERY — ARTERIOVENOUS (AV) FISTULA CREATION
Anesthesia: General | Site: Chest | Laterality: Right

## 2019-10-07 MED ORDER — HEPARIN SODIUM (PORCINE) 1000 UNIT/ML IJ SOLN
INTRAMUSCULAR | Status: DC | PRN
  Administered 2019-10-07: 3000 [IU]

## 2019-10-07 MED ORDER — LIDOCAINE 2% (20 MG/ML) 5 ML SYRINGE
INTRAMUSCULAR | Status: DC | PRN
  Administered 2019-10-07: 60 mg via INTRAVENOUS

## 2019-10-07 MED ORDER — LIDOCAINE HCL (PF) 1 % IJ SOLN
INTRAMUSCULAR | Status: AC
  Filled 2019-10-07: qty 30

## 2019-10-07 MED ORDER — PAPAVERINE HCL 30 MG/ML IJ SOLN
INTRAMUSCULAR | Status: AC
  Filled 2019-10-07: qty 2

## 2019-10-07 MED ORDER — SODIUM CHLORIDE 0.9 % IV SOLN
INTRAVENOUS | Status: DC | PRN
  Administered 2019-10-07: 25 ug/min via INTRAVENOUS

## 2019-10-07 MED ORDER — 0.9 % SODIUM CHLORIDE (POUR BTL) OPTIME
TOPICAL | Status: DC | PRN
  Administered 2019-10-07: 1000 mL

## 2019-10-07 MED ORDER — SODIUM CHLORIDE 0.9 % IV SOLN
INTRAVENOUS | Status: DC | PRN
  Administered 2019-10-07: 500 mL

## 2019-10-07 MED ORDER — PROPOFOL 10 MG/ML IV BOLUS
INTRAVENOUS | Status: DC | PRN
  Administered 2019-10-07: 120 mg via INTRAVENOUS

## 2019-10-07 MED ORDER — FENTANYL CITRATE (PF) 100 MCG/2ML IJ SOLN
25.0000 ug | INTRAMUSCULAR | Status: DC | PRN
  Administered 2019-10-07: 50 ug via INTRAVENOUS

## 2019-10-07 MED ORDER — FUROSEMIDE 80 MG PO TABS: 80.0000 mg | ORAL_TABLET | Freq: Two times a day (BID) | ORAL | 0 refills | Status: DC

## 2019-10-07 MED ORDER — FENTANYL CITRATE (PF) 100 MCG/2ML IJ SOLN
INTRAMUSCULAR | Status: AC
  Filled 2019-10-07: qty 2

## 2019-10-07 MED ORDER — DEXAMETHASONE SODIUM PHOSPHATE 10 MG/ML IJ SOLN
INTRAMUSCULAR | Status: DC | PRN
  Administered 2019-10-07: 4 mg via INTRAVENOUS

## 2019-10-07 MED ORDER — SODIUM CHLORIDE 0.9 % IV SOLN
INTRAVENOUS | Status: DC
  Administered 2019-10-07: 09:00:00 via INTRAVENOUS

## 2019-10-07 MED ORDER — FENTANYL CITRATE (PF) 250 MCG/5ML IJ SOLN
INTRAMUSCULAR | Status: DC | PRN
  Administered 2019-10-07: 50 ug via INTRAVENOUS
  Administered 2019-10-07 (×2): 25 ug via INTRAVENOUS

## 2019-10-07 MED ORDER — FENTANYL CITRATE (PF) 250 MCG/5ML IJ SOLN
INTRAMUSCULAR | Status: AC
  Filled 2019-10-07: qty 5

## 2019-10-07 MED ORDER — ATENOLOL 50 MG PO TABS
50.0000 mg | ORAL_TABLET | Freq: Once | ORAL | Status: AC
  Administered 2019-10-07: 50 mg via ORAL
  Filled 2019-10-07 (×2): qty 1

## 2019-10-07 MED ORDER — LIDOCAINE HCL 1 % IJ SOLN
INTRAMUSCULAR | Status: DC | PRN
  Administered 2019-10-07: 1 mL

## 2019-10-07 MED ORDER — LEVOTHYROXINE SODIUM 25 MCG PO TABS: 25.0000 ug | ORAL_TABLET | Freq: Every day | ORAL | 0 refills | Status: DC

## 2019-10-07 MED ORDER — HEPARIN SODIUM (PORCINE) 1000 UNIT/ML IJ SOLN
INTRAMUSCULAR | Status: AC
  Filled 2019-10-07: qty 1

## 2019-10-07 MED ORDER — SODIUM CHLORIDE 0.9 % IV SOLN
INTRAVENOUS | Status: DC
  Administered 2019-10-07: 10:00:00 via INTRAVENOUS

## 2019-10-07 MED ORDER — SODIUM CHLORIDE 0.9 % IV SOLN
INTRAVENOUS | Status: AC
  Filled 2019-10-07: qty 1.2

## 2019-10-07 MED FILL — FUROSEMIDE 80 MG TAB: 80 | 30 days supply | Qty: 60 | Fill #0

## 2019-10-07 MED FILL — LEVOTHYROXINE 25 MCG TABLET: 25 | 30 days supply | Qty: 30 | Fill #0

## 2019-10-07 SURGICAL SUPPLY — 58 items
ARMBAND PINK RESTRICT EXTREMIT (MISCELLANEOUS) ×5 IMPLANT
BAG DECANTER FOR FLEXI CONT (MISCELLANEOUS) ×5 IMPLANT
BIOPATCH RED 1 DISK 7.0 (GAUZE/BANDAGES/DRESSINGS) ×4 IMPLANT
BIOPATCH RED 1IN DISK 7.0MM (GAUZE/BANDAGES/DRESSINGS) ×1
CANISTER SUCT 3000ML PPV (MISCELLANEOUS) ×5 IMPLANT
CATH PALINDROME RT-P 15FX19CM (CATHETERS) IMPLANT
CATH PALINDROME RT-P 15FX23CM (CATHETERS) IMPLANT
CATH PALINDROME RT-P 15FX28CM (CATHETERS) IMPLANT
CATH PALINDROME RT-P 15FX55CM (CATHETERS) IMPLANT
CATH STRAIGHT 5FR 65CM (CATHETERS) IMPLANT
CLIP VESOCCLUDE MED 6/CT (CLIP) ×5 IMPLANT
CLIP VESOCCLUDE SM WIDE 6/CT (CLIP) ×5 IMPLANT
COVER PROBE W GEL 5X96 (DRAPES) ×5 IMPLANT
COVER SURGICAL LIGHT HANDLE (MISCELLANEOUS) ×5 IMPLANT
COVER WAND RF STERILE (DRAPES) IMPLANT
DECANTER SPIKE VIAL GLASS SM (MISCELLANEOUS) ×5 IMPLANT
DERMABOND ADVANCED (GAUZE/BANDAGES/DRESSINGS) ×2
DERMABOND ADVANCED .7 DNX12 (GAUZE/BANDAGES/DRESSINGS) ×3 IMPLANT
DRAPE C-ARM 42X72 X-RAY (DRAPES) ×5 IMPLANT
DRAPE CHEST BREAST 15X10 FENES (DRAPES) ×5 IMPLANT
DRAPE INCISE 23X17 IOBAN STRL (DRAPES) ×2
DRAPE INCISE IOBAN 23X17 STRL (DRAPES) ×3 IMPLANT
ELECT REM PT RETURN 9FT ADLT (ELECTROSURGICAL) ×5
ELECTRODE REM PT RTRN 9FT ADLT (ELECTROSURGICAL) ×3 IMPLANT
GAUZE 4X4 16PLY RFD (DISPOSABLE) ×5 IMPLANT
GLOVE BIO SURGEON STRL SZ7.5 (GLOVE) ×5 IMPLANT
GLOVE BIOGEL PI IND STRL 8 (GLOVE) ×3 IMPLANT
GLOVE BIOGEL PI INDICATOR 8 (GLOVE) ×2
GOWN STRL REUS W/ TWL LRG LVL3 (GOWN DISPOSABLE) ×6 IMPLANT
GOWN STRL REUS W/ TWL XL LVL3 (GOWN DISPOSABLE) ×6 IMPLANT
GOWN STRL REUS W/TWL LRG LVL3 (GOWN DISPOSABLE) ×4
GOWN STRL REUS W/TWL XL LVL3 (GOWN DISPOSABLE) ×4
HEMOSTAT SPONGE AVITENE ULTRA (HEMOSTASIS) IMPLANT
KIT BASIN OR (CUSTOM PROCEDURE TRAY) ×5 IMPLANT
KIT TURNOVER KIT B (KITS) ×5 IMPLANT
NEEDLE 18GX1X1/2 (RX/OR ONLY) (NEEDLE) ×5 IMPLANT
NEEDLE HYPO 25GX1X1/2 BEV (NEEDLE) ×5 IMPLANT
NS IRRIG 1000ML POUR BTL (IV SOLUTION) ×5 IMPLANT
PACK CV ACCESS (CUSTOM PROCEDURE TRAY) ×5 IMPLANT
PACK SURGICAL SETUP 50X90 (CUSTOM PROCEDURE TRAY) ×5 IMPLANT
PAD ARMBOARD 7.5X6 YLW CONV (MISCELLANEOUS) ×10 IMPLANT
SET MICROPUNCTURE 5F STIFF (MISCELLANEOUS) IMPLANT
SOAP 2 % CHG 4 OZ (WOUND CARE) ×5 IMPLANT
SUT ETHILON 3 0 PS 1 (SUTURE) ×5 IMPLANT
SUT MNCRL AB 4-0 PS2 18 (SUTURE) ×5 IMPLANT
SUT PROLENE 6 0 BV (SUTURE) ×5 IMPLANT
SUT PROLENE 7 0 BV 1 (SUTURE) IMPLANT
SUT VIC AB 3-0 SH 27 (SUTURE) ×2
SUT VIC AB 3-0 SH 27X BRD (SUTURE) ×3 IMPLANT
SYR 10ML LL (SYRINGE) ×5 IMPLANT
SYR 20ML LL LF (SYRINGE) ×10 IMPLANT
SYR 5ML LL (SYRINGE) ×5 IMPLANT
SYR CONTROL 10ML LL (SYRINGE) ×5 IMPLANT
TOWEL GREEN STERILE (TOWEL DISPOSABLE) ×5 IMPLANT
TOWEL GREEN STERILE FF (TOWEL DISPOSABLE) ×5 IMPLANT
UNDERPAD 30X30 (UNDERPADS AND DIAPERS) ×5 IMPLANT
WATER STERILE IRR 1000ML POUR (IV SOLUTION) ×5 IMPLANT
WIRE AMPLATZ SS-J .035X180CM (WIRE) IMPLANT

## 2019-10-07 NOTE — Anesthesia Preprocedure Evaluation (Addendum)
Anesthesia Evaluation  Patient identified by MRN, date of birth, ID band Patient awake    Reviewed: Allergy & Precautions, H&P , NPO status , Patient's Chart, lab work & pertinent test results, reviewed documented beta blocker date and time   Airway Mallampati: II  TM Distance: >3 FB Neck ROM: Full    Dental no notable dental hx. (+) Teeth Intact, Dental Advisory Given   Pulmonary sleep apnea and Continuous Positive Airway Pressure Ventilation ,    Pulmonary exam normal breath sounds clear to auscultation       Cardiovascular hypertension, Pt. on medications and Pt. on home beta blockers + CAD and +CHF   Rhythm:Regular Rate:Normal     Neuro/Psych Seizures -, Well Controlled,  negative psych ROS   GI/Hepatic negative GI ROS, Neg liver ROS,   Endo/Other  negative endocrine ROSHypothyroidism   Renal/GU ESRF and DialysisRenal disease  negative genitourinary   Musculoskeletal   Abdominal   Peds  Hematology  (+) Blood dyscrasia, anemia ,   Anesthesia Other Findings   Reproductive/Obstetrics negative OB ROS                            Anesthesia Physical Anesthesia Plan  ASA: III  Anesthesia Plan: General   Post-op Pain Management:    Induction: Intravenous  PONV Risk Score and Plan: 3 and Propofol infusion, Ondansetron and Dexamethasone  Airway Management Planned: LMA  Additional Equipment:   Intra-op Plan:   Post-operative Plan: Extubation in OR  Informed Consent: I have reviewed the patients History and Physical, chart, labs and discussed the procedure including the risks, benefits and alternatives for the proposed anesthesia with the patient or authorized representative who has indicated his/her understanding and acceptance.     Dental advisory given  Plan Discussed with: CRNA  Anesthesia Plan Comments:        Anesthesia Quick Evaluation

## 2019-10-07 NOTE — Progress Notes (Signed)
[facility]   Subjective  - No complaints   Objective (!) 181/54 84 98.6 F (37 C) (Oral) 16 95%  Intake/Output Summary (Last 24 hours) at 10/07/2019 1004 Last data filed at 10/07/2019 0807 Gross per 24 hour  Intake 590.87 ml  Output 3050 ml  Net -2459.13 ml    RIJ temporary catheter Left radial and brachial pulse palpable  Laboratory Lab Results: Recent Labs    10/05/19 0508  WBC 9.1  HGB 8.8*  HCT 27.6*  PLT 288   BMET Recent Labs    10/06/19 0435 10/07/19 0437  NA 136 136  K 3.9 3.6  CL 96* 98  CO2 26 29  GLUCOSE 102* 85  BUN 42* 25*  CREATININE 4.37* 3.44*  CALCIUM 7.5* 7.3*    COAG No results found for: INR, PROTIME No results found for: PTT  Assessment/Planning:  Plan to exchange RIJ temp catheter for tunneled dialysis catheter and left arm AVF likely brachiocephalic.  Risks and benefits discussed with patient.  Marty Heck 10/07/2019 10:04 AM --

## 2019-10-07 NOTE — Discharge Instructions (Signed)
° °  [facility]  Discharge Instructions  AV Fistula or Graft Surgery for Dialysis Access  Please refer to the following instructions for your post-procedure care. Your surgeon or physician assistant will discuss any changes with you.  Activity  You may drive the day following your surgery, if you are comfortable and no longer taking prescription pain medication. Resume full activity as the soreness in your incision resolves.  Bathing/Showering  You may shower after you go home. Keep your incision dry for 48 hours. Do not soak in a bathtub, hot tub, or swim until the incision heals completely. You may not shower if you have a hemodialysis catheter.  Incision Care  Clean your incision with mild soap and water after 48 hours. Pat the area dry with a clean towel. You do not need a bandage unless otherwise instructed. Do not apply any ointments or creams to your incision. You may have skin glue on your incision. Do not peel it off. It will come off on its own in about one week. Your arm may swell a bit after surgery. To reduce swelling use pillows to elevate your arm so it is above your heart. Your doctor will tell you if you need to lightly wrap your arm with an ACE bandage.  Diet  Resume your normal diet. There are not special food restrictions following this procedure. In order to heal from your surgery, it is CRITICAL to get adequate nutrition. Your body requires vitamins, minerals, and protein. Vegetables are the best source of vitamins and minerals. Vegetables also provide the perfect balance of protein. Processed food has little nutritional value, so try to avoid this.  Medications  Resume taking all of your medications. If your incision is causing pain, you may take over-the counter pain relievers such as acetaminophen (Tylenol). If you were prescribed a stronger pain medication, please be aware these medications can cause nausea and constipation. Prevent  nausea by taking the medication with a snack or meal. Avoid constipation by drinking plenty of fluids and eating foods with high amount of fiber, such as fruits, vegetables, and grains.  Do not take Tylenol if you are taking prescription pain medications.  Follow up Your surgeon may want to see you in the office following your access surgery. If so, this will be arranged at the time of your surgery.  Please call us immediately for any of the following conditions:  Increased pain, redness, drainage (pus) from your incision site Fever of 101 degrees or higher Severe or worsening pain at your incision site Hand pain or numbness.  Reduce your risk of vascular disease:  Stop smoking. If you would like help, call [facility] at 1-800-QUIT-NOW (308) 270-6516) or [place] at [place] your cholesterol Maintain a desired weight Control your diabetes Keep your blood pressure down  Dialysis  It will take several weeks to several months for your new dialysis access to be ready for use. Your surgeon will determine when it is okay to use it. Your nephrologist will continue to direct your dialysis. You can continue to use your Permcath until your new access is ready for use.   10/07/2019 Sophia Mcmahon [id number] 17-Nov-1941  Surgeon(s): Marty Heck, MD  Procedure(s): ARTERIOVENOUS (AV)  BRACHIOCEPHALIC FISTULA CREATION EXCHANGE OF A DIALYSIS CATHETER  x Do not stick fistula for 12 weeks    If you have any questions, please call the office at [phone].

## 2019-10-07 NOTE — Progress Notes (Signed)
Patient ID: Sophia Mcmahon, female   DOB: 1941-08-14, 78 y.o.   MRN: [id number] S: Feeling better and happy to be going home today O:BP 117/60 (BP Location: Right Arm)   Pulse 69   Temp 97.9 F (36.6 C) (Oral)   Resp 14   Ht 5\' 8"  (1.727 m)   Wt 90.9 kg   SpO2 93%   BMI 30.49 kg/m   Intake/Output Summary (Last 24 hours) at 10/07/2019 1551 Last data filed at 10/07/2019 1127 Gross per 24 hour  Intake 850.87 ml  Output 650 ml  Net 200.87 ml   Intake/Output: I/O last 3 completed shifts: In: 852.6 [P.O.:660; I.V.:42.6; IV Piggyback:150] Out: 3800 [Urine:1800; Other:2000]  Intake/Output this shift:  Total I/O In: 560 [P.O.:60; I.V.:500] Out: -  Weight change: 0.498 kg Gen: NAD CVS: no rub Resp: cta Abd: +BS, soft, NT/ND Ext: 2+ bilateral lower ext edema, L BC AVF +T/B and RIJ [facility]  Recent Labs  Lab 10/01/19 0258 10/02/19 0351 10/03/19 0355 10/04/19 0405 10/05/19 0508 10/06/19 0435 10/07/19 0437  NA 137 136 137 138 137 136 136  K 4.3 4.2 4.0 4.0 3.9 3.9 3.6  CL 99 98 98 98 99 96* 98  CO2 27 28 27 27 26 26 29   GLUCOSE 98 92 96 85 86 102* 85  BUN 35* 42* 48* 50* 34* 42* 25*  CREATININE 3.58* 3.96* 4.46* 4.61* 3.95* 4.37* 3.44*  ALBUMIN 1.3* 1.3* 1.3* 1.4* 1.4* 1.4* 1.5*  CALCIUM 7.5* 7.6* 7.6* 7.8* 7.6* 7.5* 7.3*  PHOS 3.8 4.2 4.6 5.5* 5.2* 5.9* 4.4   Liver Function Tests: Recent Labs  Lab 10/05/19 0508 10/06/19 0435 10/07/19 0437  ALBUMIN 1.4* 1.4* 1.5*   Recent Labs  Lab 10/01/19 1810  LIPASE 54*   No results for input(s): AMMONIA in the last 168 hours. CBC: Recent Labs  Lab 10/01/19 0258 10/02/19 0351 10/03/19 0355 10/04/19 0405 10/05/19 0508  WBC 12.1* 11.1* 11.2* 9.8 9.1  NEUTROABS  --   --   --  6.2 5.6  HGB 8.8* 8.8* 8.6* 8.9* 8.8*  HCT 26.7* 26.5* 26.9* 27.4* 27.6*  MCV 102.7* 102.3* 104.3* 102.6* 104.5*  PLT 214 261 304 316 288   Cardiac Enzymes: No results for input(s): CKTOTAL, CKMB, CKMBINDEX, TROPONINI in the last 168  hours. CBG: No results for input(s): GLUCAP in the last 168 hours.  Iron Studies: No results for input(s): IRON, TIBC, TRANSFERRIN, FERRITIN in the last 72 hours. Studies/Results: Dg Chest Port 1 View  Result Date: 10/07/2019 CLINICAL DATA:  Status post dialysis catheter insertion. EXAM: PORTABLE CHEST 1 VIEW COMPARISON:  09/29/2019. FINDINGS: Exchange of right IJ catheter for a tunnel catheter with catheter tip at the caval to atrial junction. No signs of pneumothorax. Bilateral effusions and basilar airspace disease persists. Cardiomediastinal contours are unchanged. Spinal and glenohumeral degenerative changes are redemonstrated. IMPRESSION: 1. The right IJ catheter is in good position with catheter tip at the caval to atrial junction. No pneumothorax. 2. Persistent effusions and basilar airspace disease. Electronically Signed   By: Zetta Bills M.D.   On: 10/07/2019 12:52   Dg Fluoro Guide Cv Line-no Report  Result Date: 10/07/2019 Fluoroscopy was utilized by the requesting physician.  No radiographic interpretation.   . Chlorhexidine Gluconate Cloth  6 each Topical Q0600  . darbepoetin (ARANESP) injection - NON-DIALYSIS  60 mcg Subcutaneous Q Sun-1800  . docusate sodium  100 mg Oral Daily  . feeding supplement (ENSURE ENLIVE)  237 mL Oral BID BM  . feeding  supplement (PRO-STAT SUGAR FREE 64)  30 mL Oral BID  . fentaNYL      . furosemide  80 mg Intravenous Q12H  . heparin  5,000 Units Subcutaneous Q8H  . levothyroxine  25 mcg Oral Q0600  . midodrine  10 mg Oral TID WC  . mupirocin ointment  1 application Nasal BID  . pantoprazole  40 mg Oral Daily  . phenytoin  300 mg Oral QHS  . sodium chloride flush  3 mL Intravenous Q12H    BMET    Component Value Date/Time   NA 136 10/07/2019 0437   K 3.6 10/07/2019 0437   CL 98 10/07/2019 0437   CO2 29 10/07/2019 0437   GLUCOSE 85 10/07/2019 0437   BUN 25 (H) 10/07/2019 0437   CREATININE 3.44 (H) 10/07/2019 0437   CALCIUM 7.3 (L)  10/07/2019 0437   GFRNONAA 12 (L) 10/07/2019 0437   GFRAA 14 (L) 10/07/2019 0437   CBC    Component Value Date/Time   WBC 9.1 10/05/2019 0508   RBC 2.64 (L) 10/05/2019 0508   HGB 8.8 (L) 10/05/2019 0508   HCT 27.6 (L) 10/05/2019 0508   HCT 29.7 (L) 09/22/2019 0422   PLT 288 10/05/2019 0508   MCV 104.5 (H) 10/05/2019 0508   MCH 33.3 10/05/2019 0508   MCHC 31.9 10/05/2019 0508   RDW 16.2 (H) 10/05/2019 0508   LYMPHSABS 1.9 10/05/2019 0508   MONOABS 1.5 (H) 10/05/2019 0508   EOSABS 0.0 10/05/2019 0508   BASOSABS 0.1 10/05/2019 0508    Assessment/Plan:  1. AKI/CKD stage3 (Cr was 1.5 in June 2020)in setting of cardiorenal syndrome vs ischemic ATN vs. Light chain disease/plasma cell disorder. She developed oliguria and worsening azotemia and hyperkalemia with hypotension and started on CVVHD on 09/24/19 and had intermittent HD on 09/27/19 and 09/29/19 with some improvement of UOP but climbing BUN/Cr.  1. Willcontinue with HD on TTS schedule. 2. UOP improving but BUN/Cr continue to climb 3. Appreciate VVS for [facility] and AVF creation 10/07/19 4. Outpatient HD to be arranged at [facility] on TTS schedule and will go tomorrow morning. 5. Family is very involved and home therapies will likely be an excellent option for themand I spoke with her daughter Danton Clap who is a dialysis nurse and will arrange PD catheter placement as an outpatient in [place]. 2. Acute on chronic CHF- 16 liters negative since admission but still has some edema. Breathing is comfortable 3. Hypotension- on midodrine. 4. Anemia of CKD- on ESA and s/p IV iron 5. Elevated light chains- concern forlight chain multiplemyelomabut light chain specification is pending. Dr. Lavone Orn following andbone surveynegative for lytic lesions. 6. Cellulitis of right arm- negative for DVT and treated with vanc/zosyn then changed to ancef. Clinically improving.  7. Vascular access- s/p RIJ TDC and L  BC AVF creation 10/07/19 by  Dr. Carlis Abbott. 8. Disposition- stable for discharge today from renal standpoint.  Donetta Potts, MD [employer] [phone]

## 2019-10-07 NOTE — Progress Notes (Signed)
Received patient back from OR-  Patient A and O x3, no pain at this time,  Incision both closed with no complications.

## 2019-10-07 NOTE — Progress Notes (Signed)
OT Cancellation Note  Patient Details Name: Sophia Mcmahon MRN: [id number] DOB: 1941-04-11   Cancelled Treatment:    Reason Eval/Treat Not Completed: Patient at procedure or test/ unavailable;Other (comment) Pt out of room for AV fistula placement this AM. Will check back as time allows.   [place], [place] [facility] [place] 10/07/2019, 10:50 AM

## 2019-10-07 NOTE — Progress Notes (Signed)
Physical Therapy Treatment Patient Details Name: Sophia Mcmahon MRN: [id number] DOB: Apr 18, 1941 Today's Date: 10/07/2019    History of Present Illness 78 year old woman with CAD, OSA, hypertension, hyperlipidemia, obesity, HFpEF (TTE 09/22/2019 EF 65 to 70%), hypothyroidism, CKD with unknown baseline presenting with acute heart failure and acute on chronic renal failure. Placed on CRRT at [facility] 09/24/19-09/27/19. s/p thoracentesis R pleural effusion 09/27/19.  She is s/p POD#0 Exchange of right internal jugular vein temporary dialysis catheter for a tunneled dialysis catheter  and Left brachiocephalic arteriovenous fistula placement    PT Comments    Pt balance with gait was decreased today; however, likely related to recent dose of Fentanyl.   She reports going home with daughter to assist, so POC still appropriate.    Follow Up Recommendations  Home health PT;Supervision/Assistance - 24 hour     Equipment Recommendations  Rolling walker with 5" wheels    Recommendations for Other Services       Precautions / Restrictions Precautions Precautions: Fall    Mobility  Bed Mobility Overal bed mobility: Needs Assistance       Supine to sit: Supervision        Transfers Overall transfer level: Needs assistance Equipment used: Rolling walker (2 wheeled)   Sit to Stand: Min assist         General transfer comment: Pt with LOB posteriorly with inital standing requiring min A to recover; sit to stand x 2  Ambulation/Gait Ambulation/Gait assistance: Min guard Gait Distance (Feet): 150 Feet Assistive device: Rolling walker (2 wheeled)       General Gait Details: Pt was unsteady with gait but no true LOB but needed CGA for safety and cues to navigate RW.  Pt had recently had Fentnyl and felt that was making her dizzy/unsteady   [name]       Balance Overall balance assessment: Needs  assistance   Sitting balance-Leahy Scale: Good       Standing balance-Leahy Scale: Poor Standing balance comment: inital standing poor , but Fair with RW                            Cognition Arousal/Alertness: Awake/alert Behavior During Therapy: WFL for tasks assessed/performed Overall Cognitive Status: Within Functional Limits for tasks assessed                                        Exercises      General Comments        Pertinent Vitals/Pain Pain Assessment: 0-10 Pain Score: 5  Pain Descriptors / Indicators: Sore Pain Intervention(s): Utilized relaxation techniques(Pt recently had Fentynl per RN)    Home Living                      Prior Function            PT Goals (current goals can now be found in the care plan section) Progress towards PT goals: Not progressing toward goals - comment(no progress due to recent medication affecting balance)    Frequency    Min 3X/week      PT Plan Current plan remains appropriate    Co-evaluation  AM-PAC PT "6 Clicks" Mobility   Outcome Measure    Help needed moving from lying on your back to sitting on the side of a flat bed without using bedrails?: None Help needed moving to and from a bed to a chair (including a wheelchair)?: A Little Help needed standing up from a chair using your arms (e.g., wheelchair or bedside chair)?: A Little Help needed to walk in hospital room?: A Little Help needed climbing 3-5 steps with a railing? : A Little 6 Click Score: 16    End of Session Equipment Utilized During Treatment: Gait belt Activity Tolerance: Other (comment)(Limited due to recent Fentnyl and pt feeling dizzy) Patient left: in chair;with call bell/phone within reach(verbalized to use call bell to get up) Nurse Communication: Mobility status(RN aware pt unsteady - likely related to meds) PT Visit Diagnosis: Unsteadiness on feet (R26.81);Other abnormalities of  gait and mobility (R26.89);Muscle weakness (generalized) (M62.81);Difficulty in walking, not elsewhere classified (R26.2)     Time: 5188-4166 PT Time Calculation (min) (ACUTE ONLY): 18 min  Charges:  $Gait Training: 8-22 mins                     [name], PT Acute Rehab Services [phone]    [name] 10/07/2019, 1:51 PM

## 2019-10-07 NOTE — Anesthesia Procedure Notes (Signed)
Procedure Name: LMA Insertion Date/Time: 10/07/2019 10:14 AM Performed by: Wilburn Cornelia, CRNA Pre-anesthesia Checklist: Patient identified, Emergency Drugs available, Suction available, Patient being monitored and Timeout performed Patient Re-evaluated:Patient Re-evaluated prior to induction Oxygen Delivery Method: Circle system utilized Preoxygenation: Pre-oxygenation with 100% oxygen Induction Type: IV induction Ventilation: Mask ventilation without difficulty LMA: LMA inserted LMA Size: 4.0 Number of attempts: 1 Placement Confirmation: CO2 detector,  breath sounds checked- equal and bilateral and positive ETCO2 Tube secured with: Tape Dental Injury: Teeth and Oropharynx as per pre-operative assessment

## 2019-10-07 NOTE — Anesthesia Postprocedure Evaluation (Signed)
Anesthesia Post Note  Patient: Database administrator  Procedure(s) Performed: ARTERIOVENOUS (AV)  BRACHIOCEPHALIC FISTULA CREATION (Left Arm Upper) EXCHANGE OF A DIALYSIS CATHETER (Right Chest)     Patient location during evaluation: PACU Anesthesia Type: General Level of consciousness: awake and alert Pain management: pain level controlled Vital Signs Assessment: post-procedure vital signs reviewed and stable Respiratory status: spontaneous breathing, nonlabored ventilation and respiratory function stable Cardiovascular status: blood pressure returned to baseline and stable Postop Assessment: no apparent nausea or vomiting Anesthetic complications: no    Last Vitals:  Vitals:   10/07/19 1210 10/07/19 1215  BP: (!) 136/48   Pulse: 67 69  Resp: 12 14  Temp:  36.5 C  SpO2: 95% 97%    Last Pain:  Vitals:   10/07/19 1215  TempSrc:   PainSc: Asleep                 Rosi Secrist,W. EDMOND

## 2019-10-07 NOTE — Progress Notes (Signed)
Discussed discharge instructions with patient and daughter including follow up appointments and medications.  Sent home instruction with patient and daughter.  Transition of care pharm brought up new medications.

## 2019-10-07 NOTE — Progress Notes (Signed)
Patient going for AV fistula placement this AM.   Pre-op procedure complete (including CHG bath, Bactroban ointment, MRSA swab (-), etc.)   Gave report to OR RN

## 2019-10-07 NOTE — Discharge Summary (Signed)
Physician Discharge Summary  Sophia Mcmahon VZS:[id number] DOB: 28-Nov-1941 DOA: 09/21/2019  PCP: Frances Maywood, FNP  Admit date: 09/21/2019 Discharge date: 10/07/2019  Admitted From: Home Disposition: Home  Recommendations for Outpatient Follow-up:  1. Follow up with PCP in 1-2 weeks 2. Follow-up for outpatient dialysis 3. Please obtain BMP/CBC in one week 4. Please follow up on the following pending results:  Home Health: None Equipment/Devices: None  Discharge Condition: Stable CODE STATUS: Full code Diet recommendation: Cardiac  Subjective: Patient seen and examined.  She has no complaints.  She is excited to go home.  Brief/Interim Summary: 78 year old female admitted 9/30 to [facility] with 30lb weight gain in 6 weeks & progressive renal failure, tx to [facility] on 10/2 in the setting of progressive cardiorenal syndrome, hypotension, and worsening acute on chronic renal failure.The patient required dopamine infusion in the ICU. She had a HD catheter placed, and she underwent CRRT from 09/23/2021 09/27/2019 until the catheter clotted off. As her FENa was low and consistent with cardiorenal syndrome or acute glomerulonephropathy. Urine immunofixation and UPEP were performed. Results are concerning multiple myeloma.  Hematology Oncology was consulted on 09/28/2019.Bone scan was performed on 09/28/2019 which was unremarkable except diffuse osteopenia.  Oncology recommended bone marrow biopsy which was performed by Dr. Levan Hurst of IR on 09/30/19 but for some reason, results are still pending.  She was then diagnosed with right upper extremity cellulitis on 09/29/2019 and started on vancomycin and Zosyn which were de-escalated to IV Ancef on 10/01/2019.  Her renal function is started to decline so she had another session of hemodialysis on 10/04/2019 and then again on 10/06/2019.  Vascular surgery was consulted and she underwent exchange of right IJ temporary catheter for tunneled  dialysis catheter and left AVM brachiocephalic on 44/92/0100.  She completed 7-day course of cefazolin today.  She has been now cleared by nephrology to be discharged as well and she is doing fine and and is hemodynamically stable so she will be discharged.  Her antihypertensives were held during this hospitalization, initially mainly due to her AKI but despite of that her blood pressure has remained stable so at this point in time we are going to discontinue her hydralazine, losartan and atenolol now that she is going to be dialysis patient.  Per nephrologist, her dialysis has been arranged as an outpatient on Monday Wednesday Friday schedule.  Per nephrology recommendations, I am also discharging this patient on 80 mg p.o. Lasix twice daily.  She will follow with PCP within 1 week.  Discharge Diagnoses:  Principal Problem:   Acute renal failure superimposed on chronic kidney disease ([place]) Active Problems:   Protein-calorie malnutrition, severe (HCC)   Abnormal liver function   Anemia   Anasarca   Acute on chronic diastolic CHF (congestive heart failure) (HCC)   Cardiorenal syndrome   Acute renal failure (ARF) (HCC)   Pleural effusion   Leukocytosis   Multiple myeloma (HCC)   Acute on chronic congestive heart failure [facility])    Discharge Instructions  Discharge Instructions    Discharge patient   Complete by: As directed    Discharge disposition: 01-Home or Self Care   Discharge patient date: 10/07/2019     Allergies as of 10/07/2019      Reactions   Sulfa Antibiotics Hives      Medication List    STOP taking these medications   atenolol 50 MG tablet Commonly known as: TENORMIN   hydrALAZINE 25 MG tablet Commonly known as: APRESOLINE  losartan 25 MG tablet Commonly known as: COZAAR   potassium chloride 10 MEQ tablet Commonly known as: KLOR-CON     TAKE these medications   atorvastatin 80 MG tablet Commonly known as: LIPITOR Take 80 mg by mouth daily.    felodipine 10 MG 24 hr tablet Commonly known as: PLENDIL Take 10 mg by mouth daily.   furosemide 80 MG tablet Commonly known as: Lasix Take 1 tablet (80 mg total) by mouth 2 (two) times daily.   gemfibrozil 600 MG tablet Commonly known as: LOPID Take 1 tablet by mouth 2 (two) times daily.   levothyroxine 25 MCG tablet Commonly known as: SYNTHROID Take 1 tablet (25 mcg total) by mouth daily at 6 (six) AM. Start taking on: October 08, 2019   mometasone 50 MCG/ACT nasal spray Commonly known as: NASONEX Place 1 spray into the nose 2 (two) times daily. 1 spray into each nostril twice daily   omeprazole 40 MG capsule Commonly known as: PRILOSEC Take 40 mg by mouth daily.   phenytoin 100 MG ER capsule Commonly known as: DILANTIN Take 3 capsules by mouth at bedtime.      Follow-up Information    [facility] In 6 weeks.   Specialty: Vascular Surgery Why: Office will call you to arrange your appt (sent) Contact information: [place] [id number] [place], Isabella, FNP Follow up in 1 week(s).   Specialty: Family Medicine Contact information: [place] [address] [phone]          Allergies  Allergen Reactions  . Sulfa Antibiotics Hives    Consultations: Nephrology and vascular surgery   Procedures/Studies: Ct Abdomen Pelvis Wo Contrast  Result Date: 09/24/2019 CLINICAL DATA:  Acute renal failure. Unable to visualize left kidney on ultrasound. EXAM: CT ABDOMEN AND PELVIS WITHOUT CONTRAST TECHNIQUE: Multidetector CT imaging of the abdomen and pelvis was performed following the standard protocol without IV contrast. COMPARISON:  None. FINDINGS: Artifact over the posterior field of view over the lower thorax and upper abdomen possibly due to patient's arms. Lower chest: Moderate size right effusion and small left effusion with associated bibasilar atelectasis. Calcified  plaque over the left anterior descending and right coronary arteries. Calcified plaque over the distal descending thoracic aorta. Generalized subcutaneous edema over the thorax. Hepatobiliary: Mild nodular contour to the liver. No focal liver mass. Gallbladder and biliary tree are normal. Pancreas: Normal. Spleen: Normal. Adrenals/Urinary Tract: Adrenal glands are normal. Kidneys are normal in size without hydronephrosis. No definite nephrolithiasis. Foley catheter present within a decompressed bladder. Stomach/Bowel: Stomach and small bowel are normal. Colon is within normal. Appendix is not well visualized. Vascular/Lymphatic: Mild-to-moderate calcified plaque over the abdominal aorta. No definite adenopathy. Reproductive: Suggestion previous hysterectomy. Other: No significant free fluid or focal inflammatory change. Diffuse subcutaneous edema. Musculoskeletal: Degenerative change of the spine and hips. IMPRESSION: No acute findings in the abdomen/pelvis. Moderate size right effusion and small left effusion with associated bibasilar atelectasis. Mild nodular contour to the liver which may be due to cirrhosis. Aortic Atherosclerosis (ICD10-I70.0). Atherosclerotic coronary artery disease. Electronically Signed   By: Marin Olp M.D.   On: 09/24/2019 12:27   US Abdomen Complete  Result Date: 09/22/2019 CLINICAL DATA:  Acute renal failure EXAM: ABDOMEN ULTRASOUND COMPLETE COMPARISON:  None. FINDINGS: Gallbladder: No gallstones or wall thickening visualized. No sonographic Murphy sign noted by sonographer. Common bile duct: Diameter: Not visualized Liver: No focal lesion identified. Within normal  limits in parenchymal echogenicity. Portal vein is patent on color Doppler imaging with normal direction of blood flow towards the liver. IVC: No abnormality visualized. Pancreas: Visualized portion unremarkable. Spleen: Not visualized Right Kidney: Length: 10.5 cm, difficult to visualize. Increased echotexture  diffusely. No hydronephrosis seen. Left Kidney: Length: Not visualized. Abdominal aorta: No aneurysm visualized. Other findings: Moderate right pleural effusion noted. Small amount of ascites seen. IMPRESSION: Limited study due to bowel gas and body habitus. Right kidney visualized and appears echogenic suggesting chronic medical renal disease. Left kidney not visualized. Small amount of ascites.  Right pleural effusion. Electronically Signed   By: Rolm Baptise M.D.   On: 09/22/2019 11:17   Dg Chest Port 1 View  Result Date: 09/29/2019 CLINICAL DATA:  Leukocytosis.  Shortness of breath EXAM: PORTABLE CHEST 1 VIEW COMPARISON:  Two days ago FINDINGS: Haziness of the bilateral lower chest is unchanged. Cardiomegaly without edema in the apical lungs. No pneumothorax. Right IJ line with tip at the SVC. Notable advanced glenohumeral osteoarthritis with loose body on the right. IMPRESSION: Stable opacification at the bases from atelectasis/pneumonia and small pleural effusion. Electronically Signed   By: Monte Fantasia M.D.   On: 09/29/2019 10:52   Dg Chest Port 1 View  Result Date: 09/27/2019 CLINICAL DATA:  Status post thoracentesis. EXAM: PORTABLE CHEST 1 VIEW COMPARISON:  Same day. FINDINGS: Stable cardiomegaly. Right internal jugular catheter is unchanged. No pneumothorax is noted. Right pleural effusion is significantly smaller status post thoracentesis. Stable left basilar atelectasis and effusion is noted. Bony thorax is unremarkable. IMPRESSION: Right pleural effusion is significantly smaller status post thoracentesis. No pneumothorax is noted. Electronically Signed   By: Marijo Conception M.D.   On: 09/27/2019 15:00   Dg Chest Port 1 View  Result Date: 09/27/2019 CLINICAL DATA:  Acute on chronic renal failure.  Pleural effusion. EXAM: PORTABLE CHEST 1 VIEW COMPARISON:  Single-view of the chest 10/0 2 scratch the single view of the chest and CT abdomen and pelvis 09/24/2019. FINDINGS: Right IJ approach  dialysis catheter is unchanged. Moderately large to large right pleural effusion and moderate left pleural effusion are again seen and not notably changed. Heart size is enlarged. No edema. Atherosclerosis noted. IMPRESSION: No marked change in moderately large to large right and small to moderate left pleural effusions with associated compressive atelectasis. Cardiomegaly without edema. Atherosclerosis. Electronically Signed   By: Inge Rise M.D.   On: 09/27/2019 09:11   Dg Chest Port 1 View  Result Date: 09/24/2019 CLINICAL DATA:  Central line placement EXAM: PORTABLE CHEST 1 VIEW COMPARISON:  09/22/2019 FINDINGS: Interval placement of right-sided central venous catheter, with tip overlying the SVC. No right pneumothorax. Enlarged cardiomediastinal silhouette with vascular congestion. Asymmetric opacity in the right thorax is likely layering effusion. Left pleural effusion remains. Continued dense consolidation at both bases. Possible calcified loose body at the right shoulder. IMPRESSION: 1. Right-sided central venous catheter tip over the SVC. No pneumothorax 2. Cardiomegaly with vascular congestion 3. Bilateral pleural effusions, likely layering on the right side. Persistent dense airspace disease at both lung bases. Electronically Signed   By: Donavan Foil M.D.   On: 09/24/2019 02:00   Dg Chest Port 1 View  Result Date: 09/22/2019 CLINICAL DATA:  Shortness of breath. History of CHF, stopped diuretics. 30 pound weight gain. EXAM: PORTABLE CHEST 1 VIEW COMPARISON:  None. FINDINGS: Cardiomegaly. There is moderate pulmonary edema. Hazy lung base opacities likely combination of pleural effusions and compressive atelectasis. No pneumothorax or confluent  airspace disease in the aerated lungs. Advanced degenerative change of both shoulders. IMPRESSION: CHF. Cardiomegaly with moderate pleural effusions and pulmonary edema. Electronically Signed   By: Keith Rake M.D.   On: 09/22/2019 00:40   Dg  Bone Survey Met  Result Date: 10/03/2019 CLINICAL DATA:  Light chain disease. EXAM: METASTATIC BONE SURVEY COMPARISON:  Metastatic bone survey dated 09/28/2019 FINDINGS: There are no lytic lesions in the skeleton. There are severe arthritic changes of both shoulders with multiple loose bodies in the right glenohumeral joint. There are arthritic changes of both knees. Chronic degenerative facet arthritis in the cervical spine. Since the prior bone survey of 09/28/2019 the patient has developed small bilateral pleural effusions. Central line in place with the tip at the level of the carina in good position. IMPRESSION: 1. No lytic lesions in the skeleton. 2. New small bilateral pleural effusions. 3. Severe arthritic changes of both shoulders with multiple loose bodies. Electronically Signed   By: Lorriane Shire M.D.   On: 10/03/2019 17:04   Dg Bone Survey Met  Result Date: 09/28/2019 CLINICAL DATA:  Elevated immunoglobulin free light chain EXAM: METASTATIC BONE SURVEY COMPARISON:  None. FINDINGS: There is diffuse osteopenia seen throughout. Skull: No focal lytic or blastic osseous lesion. Cervical Spine: No focal lytic or blastic osseous lesions. Multilevel degenerative changes are seen. Thoracic Spine: Multilevel degenerative changes are seen. No focal lytic or blastic lesions are seen. Anterior flowing osteophytes are noted. Chest: Mild cardiomegaly. Aortic calcifications. A right-sided central venous catheter seen with the tip at the upper SVC. A probable small left pleural effusion is seen. No lytic or blastic lesions. Lumbar Spine: Disc loss height with facet arthrosis most notable at L4-L5 and L5-S1. No lytic or blastic lesions. Scattered vascular calcifications. Pelvis: No focal lytic or blastic osseous lesion. Bilateral hip osteoarthritis. Right Upper Extremity: Glenohumeral joint and AC joint arthrosis seen with joint space loss. Periosteal reaction seen along the lateral humerus. Enthesophytes seen at  the elbow. No focal lytic or blastic osseous lesion. Left Upper Extremity: Glenohumeral joint and AC joint arthrosis. These fight seen at the elbow. No focal lytic or blastic osseous lesion. Right Lower Extremity: Right hip and knee osteoarthritis seen superior joint space and osteophyte formation. No focal lytic or blastic osseous lesion. Left Lower Extremity: Left knee and hip osteoarthritis is seen. No focal lytic or blastic osseous lesion. IMPRESSION: No lytic or blastic lesions throughout. Diffuse osteopenia Electronically Signed   By: Prudencio Pair M.D.   On: 09/28/2019 20:51   Dg Fluoro Guide Cv Line-no Report  Result Date: 10/07/2019 Fluoroscopy was utilized by the requesting physician.  No radiographic interpretation.   Vas [place] Upper Ext Vein Mapping (pre-op Avf)  Result Date: 10/05/2019 UPPER EXTREMITY VEIN MAPPING  Indications: Pre-access. Comparison Study: No prior study. Performing Technologist: Maudry Mayhew MHA, RDMS, RVT, RDCS  Examination Guidelines: A complete evaluation includes B-mode imaging, spectral Doppler, color Doppler, and power Doppler as needed of all accessible portions of each vessel. Bilateral testing is considered an integral part of a complete examination. Limited examinations for reoccurring indications may be performed as noted. +-----------------+-------------+----------+---------+ Left Cephalic    Diameter (cm)Depth (cm)Findings  +-----------------+-------------+----------+---------+ Shoulder             0.30        0.69             +-----------------+-------------+----------+---------+ Prox upper arm       0.32        0.81             +-----------------+-------------+----------+---------+  Mid upper arm        0.31        0.59   branching +-----------------+-------------+----------+---------+ Dist upper arm       0.36        0.75             +-----------------+-------------+----------+---------+ Antecubital fossa    0.38        0.34    branching +-----------------+-------------+----------+---------+ Prox forearm         0.26        0.63             +-----------------+-------------+----------+---------+ Mid forearm          0.18        0.56   branching +-----------------+-------------+----------+---------+ Dist forearm         0.21        0.22             +-----------------+-------------+----------+---------+ Wrist                0.24        0.28             +-----------------+-------------+----------+---------+ +-----------------+-------------+----------+--------------+ Left Basilic     Diameter (cm)Depth (cm)   Findings    +-----------------+-------------+----------+--------------+ Prox upper arm       0.51                              +-----------------+-------------+----------+--------------+ Mid upper arm        0.45                              +-----------------+-------------+----------+--------------+ Dist upper arm       0.33                 branching    +-----------------+-------------+----------+--------------+ Antecubital fossa    0.18                 branching    +-----------------+-------------+----------+--------------+ Prox forearm         0.12                              +-----------------+-------------+----------+--------------+ Mid forearm                             not visualized +-----------------+-------------+----------+--------------+ Distal forearm                          not visualized +-----------------+-------------+----------+--------------+ Wrist                                   not visualized +-----------------+-------------+----------+--------------+ *See table(s) above for measurements and observations.  Diagnosing physician: Deitra Mayo MD Electronically signed by Deitra Mayo MD on 10/05/2019 at 12:00:09 PM.    Final    Vas [place] Upper Extremity Venous Duplex  Result Date: 09/28/2019 UPPER VENOUS STUDY  Indications: Edema, and  Pain Comparison Study: No prior study. Performing Technologist: Maudry Mayhew MHA, RDMS, RVT, RDCS  Examination Guidelines: A complete evaluation includes B-mode imaging, spectral Doppler, color Doppler, and power Doppler as needed of all accessible portions of each vessel. Bilateral testing is considered an integral part of a complete examination. Limited examinations for reoccurring  indications may be performed as noted.  Right Findings: +----------+------------+---------+-----------+----------+-------+ RIGHT     CompressiblePhasicitySpontaneousPropertiesSummary +----------+------------+---------+-----------+----------+-------+ IJV                      Yes       Yes                      +----------+------------+---------+-----------+----------+-------+ Subclavian    Full       Yes       Yes                      +----------+------------+---------+-----------+----------+-------+ Axillary      Full       Yes       Yes                      +----------+------------+---------+-----------+----------+-------+ Brachial      Full       Yes       Yes                      +----------+------------+---------+-----------+----------+-------+ Radial        Full                                          +----------+------------+---------+-----------+----------+-------+ Ulnar         Full                                          +----------+------------+---------+-----------+----------+-------+ Cephalic      Full                                          +----------+------------+---------+-----------+----------+-------+ Basilic       Full                                          +----------+------------+---------+-----------+----------+-------+  Left Findings: +----------+------------+---------+-----------+----------+--------------+ LEFT      CompressiblePhasicitySpontaneousProperties   Summary      +----------+------------+---------+-----------+----------+--------------+ Subclavian                                          Not visualized +----------+------------+---------+-----------+----------+--------------+  Summary:  Right: No evidence of deep vein thrombosis in the upper extremity. No evidence of superficial vein thrombosis in the upper extremity.  *See table(s) above for measurements and observations.  Diagnosing physician: Curt Jews MD Electronically signed by Curt Jews MD on 09/28/2019 at 4:34:29 PM.    Final      Discharge Exam: Vitals:   10/07/19 1215 10/07/19 1236  BP:  117/60  Pulse: 69 69  Resp: 14   Temp: 97.7 F (36.5 C) 97.9 F (36.6 C)  SpO2: 97% 93%   Vitals:   10/07/19 1200 10/07/19 1210 10/07/19 1215 10/07/19 1236  BP:  (!) 136/48  117/60  Pulse: 71 67 69 69  Resp: 18 12 14    Temp:   97.7 F (36.5 C) 97.9 F (36.6 C)  TempSrc:    Oral  SpO2: 98% 95% 97% 93%  Weight:      Height:        General: Pt is alert, awake, not in acute distress Cardiovascular: RRR, S1/S2 +, no rubs, no gallops Respiratory: CTA bilaterally, no wheezing, no rhonchi Abdominal: Soft, NT, ND, bowel sounds + Extremities: no edema, no cyanosis    The results of significant diagnostics from this hospitalization (including imaging, microbiology, ancillary and laboratory) are listed below for reference.     Microbiology: Recent Results (from the past 240 hour(s))  Body fluid culture     Status: None   Collection Time: 09/27/19  2:09 PM   Specimen: Body Fluid  Result Value Ref Range Status   Specimen Description FLUID RIGHT PLEURAL  Final   Special Requests NONE  Final   Gram Stain NO WBC SEEN NO ORGANISMS SEEN   Final   Culture   Final    NO GROWTH 3 DAYS Performed at [facility], [address] [place], [place] [address]    Report Status 10/01/2019 FINAL  Final  Culture, blood (routine x 2)     Status: None   Collection Time: 09/29/19  8:00 AM    Specimen: BLOOD LEFT ARM  Result Value Ref Range Status   Specimen Description BLOOD LEFT ARM  Final   Special Requests   Final    BOTTLES DRAWN AEROBIC AND ANAEROBIC Blood Culture adequate volume   Culture   Final    NO GROWTH 5 DAYS Performed at [facility], [place] [address] [place], [place] [address]    Report Status 10/04/2019 FINAL  Final  Culture, blood (routine x 2)     Status: None   Collection Time: 09/29/19  8:20 AM   Specimen: BLOOD  Result Value Ref Range Status   Specimen Description BLOOD LEFT ANTECUBITAL  Final   Special Requests   Final    BOTTLES DRAWN AEROBIC AND ANAEROBIC Blood Culture adequate volume   Culture   Final    NO GROWTH 5 DAYS Performed at [facility], [place] [address], [place], [place] [address]    Report Status 10/04/2019 FINAL  Final  Culture, Urine     Status: Abnormal   Collection Time: 09/29/19  8:09 PM   Specimen: Urine, Random  Result Value Ref Range Status   Specimen Description URINE, RANDOM  Final   Special Requests   Final    NONE Performed at [facility], [place] [address], [place], [place] [address]    Culture >=100,000 COLONIES/mL STAPHYLOCOCCUS LUGDUNENSIS (A)  Final   Report Status 10/01/2019 FINAL  Final   Organism ID, Bacteria STAPHYLOCOCCUS LUGDUNENSIS (A)  Final      Susceptibility   Staphylococcus lugdunensis - MIC*    CIPROFLOXACIN <=0.5 SENSITIVE Sensitive     GENTAMICIN <=0.5 SENSITIVE Sensitive     NITROFURANTOIN <=16 SENSITIVE Sensitive     OXACILLIN >=4 RESISTANT Resistant     TETRACYCLINE <=1 SENSITIVE Sensitive     VANCOMYCIN <=0.5 SENSITIVE Sensitive     TRIMETH/SULFA <=10 SENSITIVE Sensitive     CLINDAMYCIN >=8 RESISTANT Resistant     RIFAMPIN <=0.5 SENSITIVE Sensitive     Inducible Clindamycin NEGATIVE Sensitive     * >=100,000 COLONIES/mL STAPHYLOCOCCUS LUGDUNENSIS  SARS CORONAVIRUS 2 (TAT 6-24 HRS) Nasopharyngeal Nasopharyngeal Swab     Status: None   Collection Time: 10/07/19 12:09 AM    Specimen: Nasopharyngeal Swab  Result Value Ref  Range Status   SARS Coronavirus 2 NEGATIVE NEGATIVE Final    Comment: (NOTE) SARS-CoV-2 target nucleic acids are NOT DETECTED. The SARS-CoV-2 RNA is generally detectable in upper and lower respiratory specimens during the acute phase of infection. Negative results do not preclude SARS-CoV-2 infection, do not rule out co-infections with other pathogens, and should not be used as the sole basis for treatment or other patient management decisions. Negative results must be combined with clinical observations, patient history, and epidemiological information. The expected result is Negative. Fact Sheet for Patients: SugarRoll.be Fact Sheet for Healthcare Providers: https://www.woods-mathews.com/ This test is not yet approved or cleared by the [place] FDA and  has been authorized for detection and/or diagnosis of SARS-CoV-2 by FDA under an Emergency Use Authorization (EUA). This EUA will remain  in effect (meaning this test can be used) for the duration of the COVID-19 declaration under Section 56 4(b)(1) of the Act, 21 U.S.C. section 360bbb-3(b)(1), unless the authorization is terminated or revoked sooner. Performed at [facility], [place] [address], [place], [place] [address]   Surgical PCR screen     Status: None   Collection Time: 10/07/19 12:15 AM   Specimen: Nasal Mucosa; Nasal Swab  Result Value Ref Range Status   MRSA, PCR NEGATIVE NEGATIVE Final   Staphylococcus aureus NEGATIVE NEGATIVE Final    Comment: (NOTE) The Xpert SA Assay (FDA approved for NASAL specimens in patients 23 years of age and older), is one component of a comprehensive surveillance program. It is not intended to diagnose infection nor to guide or monitor treatment. Performed at [facility], [place] [address], [place],  [address]      Labs: BNP (last 3 results) Recent Labs    09/22/19 0121   BNP 8,676.7*   Basic Metabolic Panel: Recent Labs  Lab 10/03/19 0355 10/04/19 0405 10/05/19 0508 10/06/19 0435 10/07/19 0437  NA 137 138 137 136 136  K 4.0 4.0 3.9 3.9 3.6  CL 98 98 99 96* 98  CO2 27 27 26 26 29   GLUCOSE 96 85 86 102* 85  BUN 48* 50* 34* 42* 25*  CREATININE 4.46* 4.61* 3.95* 4.37* 3.44*  CALCIUM 7.6* 7.8* 7.6* 7.5* 7.3*  PHOS 4.6 5.5* 5.2* 5.9* 4.4   Liver Function Tests: Recent Labs  Lab 10/03/19 0355 10/04/19 0405 10/05/19 0508 10/06/19 0435 10/07/19 0437  ALBUMIN 1.3* 1.4* 1.4* 1.4* 1.5*   Recent Labs  Lab 10/01/19 1810  LIPASE 54*   No results for input(s): AMMONIA in the last 168 hours. CBC: Recent Labs  Lab 10/01/19 0258 10/02/19 0351 10/03/19 0355 10/04/19 0405 10/05/19 0508  WBC 12.1* 11.1* 11.2* 9.8 9.1  NEUTROABS  --   --   --  6.2 5.6  HGB 8.8* 8.8* 8.6* 8.9* 8.8*  HCT 26.7* 26.5* 26.9* 27.4* 27.6*  MCV 102.7* 102.3* 104.3* 102.6* 104.5*  PLT 214 261 304 316 288   Cardiac Enzymes: No results for input(s): CKTOTAL, CKMB, CKMBINDEX, TROPONINI in the last 168 hours. BNP: Invalid input(s): POCBNP CBG: No results for input(s): GLUCAP in the last 168 hours. D-Dimer No results for input(s): DDIMER in the last 72 hours. Hgb A1c No results for input(s): HGBA1C in the last 72 hours. Lipid Profile No results for input(s): CHOL, HDL, LDLCALC, TRIG, CHOLHDL, LDLDIRECT in the last 72 hours. Thyroid function studies No results for input(s): TSH, T4TOTAL, T3FREE, THYROIDAB in the last 72 hours.  Invalid input(s): FREET3 Anemia work up No results for input(s): VITAMINB12,  FOLATE, FERRITIN, TIBC, IRON, RETICCTPCT in the last 72 hours. Urinalysis    Component Value Date/Time   COLORURINE RED (A) 09/29/2019 1730   APPEARANCEUR TURBID (A) 09/29/2019 1730   LABSPEC  09/29/2019 1730    TEST NOT REPORTED DUE TO COLOR INTERFERENCE OF URINE PIGMENT   PHURINE  09/29/2019 1730    TEST NOT REPORTED DUE TO COLOR INTERFERENCE OF URINE  PIGMENT   GLUCOSEU (A) 09/29/2019 1730    TEST NOT REPORTED DUE TO COLOR INTERFERENCE OF URINE PIGMENT   HGBUR (A) 09/29/2019 1730    TEST NOT REPORTED DUE TO COLOR INTERFERENCE OF URINE PIGMENT   BILIRUBINUR (A) 09/29/2019 1730    TEST NOT REPORTED DUE TO COLOR INTERFERENCE OF URINE PIGMENT   KETONESUR (A) 09/29/2019 1730    TEST NOT REPORTED DUE TO COLOR INTERFERENCE OF URINE PIGMENT   PROTEINUR (A) 09/29/2019 1730    TEST NOT REPORTED DUE TO COLOR INTERFERENCE OF URINE PIGMENT   NITRITE (A) 09/29/2019 1730    TEST NOT REPORTED DUE TO COLOR INTERFERENCE OF URINE PIGMENT   LEUKOCYTESUR (A) 09/29/2019 1730    TEST NOT REPORTED DUE TO COLOR INTERFERENCE OF URINE PIGMENT   Sepsis Labs Invalid input(s): PROCALCITONIN,  WBC,  LACTICIDVEN Microbiology Recent Results (from the past 240 hour(s))  Body fluid culture     Status: None   Collection Time: 09/27/19  2:09 PM   Specimen: Body Fluid  Result Value Ref Range Status   Specimen Description FLUID RIGHT PLEURAL  Final   Special Requests NONE  Final   Gram Stain NO WBC SEEN NO ORGANISMS SEEN   Final   Culture   Final    NO GROWTH 3 DAYS Performed at [facility], [address], [place], [place] [address]    Report Status 10/01/2019 FINAL  Final  Culture, blood (routine x 2)     Status: None   Collection Time: 09/29/19  8:00 AM   Specimen: BLOOD LEFT ARM  Result Value Ref Range Status   Specimen Description BLOOD LEFT ARM  Final   Special Requests   Final    BOTTLES DRAWN AEROBIC AND ANAEROBIC Blood Culture adequate volume   Culture   Final    NO GROWTH 5 DAYS Performed at [facility], [place] [address], [place], [place] [address]    Report Status 10/04/2019 FINAL  Final  Culture, blood (routine x 2)     Status: None   Collection Time: 09/29/19  8:20 AM   Specimen: BLOOD  Result Value Ref Range Status   Specimen Description BLOOD LEFT ANTECUBITAL  Final   Special Requests   Final    BOTTLES DRAWN AEROBIC AND  ANAEROBIC Blood Culture adequate volume   Culture   Final    NO GROWTH 5 DAYS Performed at [facility], [place] [address], [place], [place] [address]    Report Status 10/04/2019 FINAL  Final  Culture, Urine     Status: Abnormal   Collection Time: 09/29/19  8:09 PM   Specimen: Urine, Random  Result Value Ref Range Status   Specimen Description URINE, RANDOM  Final   Special Requests   Final    NONE Performed at [facility], [place] [address] [place], [place] [address]    Culture >=100,000 COLONIES/mL STAPHYLOCOCCUS LUGDUNENSIS (A)  Final   Report Status 10/01/2019 FINAL  Final   Organism ID, Bacteria STAPHYLOCOCCUS LUGDUNENSIS (A)  Final      Susceptibility  Staphylococcus lugdunensis - MIC*    CIPROFLOXACIN <=0.5 SENSITIVE Sensitive     GENTAMICIN <=0.5 SENSITIVE Sensitive     NITROFURANTOIN <=16 SENSITIVE Sensitive     OXACILLIN >=4 RESISTANT Resistant     TETRACYCLINE <=1 SENSITIVE Sensitive     VANCOMYCIN <=0.5 SENSITIVE Sensitive     TRIMETH/SULFA <=10 SENSITIVE Sensitive     CLINDAMYCIN >=8 RESISTANT Resistant     RIFAMPIN <=0.5 SENSITIVE Sensitive     Inducible Clindamycin NEGATIVE Sensitive     * >=100,000 COLONIES/mL STAPHYLOCOCCUS LUGDUNENSIS  SARS CORONAVIRUS 2 (TAT 6-24 HRS) Nasopharyngeal Nasopharyngeal Swab     Status: None   Collection Time: 10/07/19 12:09 AM   Specimen: Nasopharyngeal Swab  Result Value Ref Range Status   SARS Coronavirus 2 NEGATIVE NEGATIVE Final    Comment: (NOTE) SARS-CoV-2 target nucleic acids are NOT DETECTED. The SARS-CoV-2 RNA is generally detectable in upper and lower respiratory specimens during the acute phase of infection. Negative results do not preclude SARS-CoV-2 infection, do not rule out co-infections with other pathogens, and should not be used as the sole basis for treatment or other patient management decisions. Negative results must be combined with clinical observations, patient history, and epidemiological  information. The expected result is Negative. Fact Sheet for Patients: SugarRoll.be Fact Sheet for Healthcare Providers: https://www.woods-mathews.com/ This test is not yet approved or cleared by the [place] FDA and  has been authorized for detection and/or diagnosis of SARS-CoV-2 by FDA under an Emergency Use Authorization (EUA). This EUA will remain  in effect (meaning this test can be used) for the duration of the COVID-19 declaration under Section 56 4(b)(1) of the Act, 21 U.S.C. section 360bbb-3(b)(1), unless the authorization is terminated or revoked sooner. Performed at [facility], [place] [address] [place], [place] [address]   Surgical PCR screen     Status: None   Collection Time: 10/07/19 12:15 AM   Specimen: Nasal Mucosa; Nasal Swab  Result Value Ref Range Status   MRSA, PCR NEGATIVE NEGATIVE Final   Staphylococcus aureus NEGATIVE NEGATIVE Final    Comment: (NOTE) The Xpert SA Assay (FDA approved for NASAL specimens in patients 33 years of age and older), is one component of a comprehensive surveillance program. It is not intended to diagnose infection nor to guide or monitor treatment. Performed at [facility], [place] [address], [place], [place] [address]      Time coordinating discharge: Over 30 minutes  SIGNED:   Darliss Cheney, MD  [facility] 10/07/2019, 12:42 PM  If 7PM-7AM, please contact night-coverage [email or web address] Password [email or web address]

## 2019-10-07 NOTE — Op Note (Signed)
OPERATIVE NOTE   PROCEDURE: 1.  Exchange of right internal jugular vein temporary dialysis catheter for a tunneled dialysis catheter (19 cm palindrome) 2.  Left brachiocephalic arteriovenous fistula placement  PRE-OPERATIVE DIAGNOSIS: AKI on CKD  POST-OPERATIVE DIAGNOSIS: same as above   SURGEON: Marty Heck, MD  ASSISTANT(S): Liana Crocker, PA  ANESTHESIA: general  ESTIMATED BLOOD LOSS: Minimal  FINDING(S): 1.  Right internal jugular vein temporary dialysis catheter was exchanged for a tunneled 19 cm palindrome catheter with the tip at the cavoatrial junction. 2.  Cephalic vein: 3 mm, acceptable 3.  Brachial artery: 3 mm, atherosclerotic disease evident 4.  Venous outflow: palpable thrill  5.  Radial flow: dopplerable radial signal  SPECIMEN(S):  none  INDICATIONS:   Sophia Mcmahon is a 78 y.o. female who presents for exchange of right internal jugular vein temporary dialysis catheter and placement of permanent dialysis access.  We had discussed option of left brachiocephalic arteriovenous fistula placement given vein mapping.  The patient is aware the risks include but are not limited to: bleeding, infection, steal syndrome, nerve damage, ischemic monomelic neuropathy, failure to mature, and need for additional procedures.  The patient is aware of the risks of the procedure and elects to proceed forward.   DESCRIPTION: After full informed written consent was obtained from the patient, the patient was brought back to the operating room and placed supine upon the operating table.  Prior to induction, the patient received IV antibiotics.   General anesthesia was induced.  Initially prepped right neck where her existing temporary catheter was placed as well as the left arm for fistula placement.  Timeout was performed.  Initially started by started by placing a J-wire down the existing right internal jugular vein temporary dialysis catheter under fluoroscopy until my  wire was down into the right atrium.  I then remove the old catheter that was passed off the field.  I then selected 19 cm palindrome catheter.  This was measured on the chest wall and I made a counterincision on the chest wall and then tunneled from the chest wall to the IJ insertion site of the previous catheter with a tunneler.  The catheter was then transected with the first mark attached to the tunneler and then tunneled under the skin burying the cuff under the skin.  I then sequentially dilated over the wire until I placed a large dilator peel-away sheath into the right atrium.  The inner dilator and wire were removed.  I then placed the tip of the catheter into the peel-away sheath and the sheath was peeled away placing the catheter at the cavoatrial junction on the right.  Ultimately I checked to make sure there was no kinking under fluoroscopy.  The catheter was cut at the second mark.  All adapters were attached and the catheter flushed and withdrew without issue.  It was secured to the chest wall with several 3-0 nylons.  A 4-0 Monocryl was placed at the neck insertion site.  Dermabond was applied.  The catheter was loaded with heparinized saline according to manufacturer recommendations and dry sterile dressing applied.  At that point time we changed our gown and gloves.  Left arm had already been prepped prepped and draped.  I turned my attention first to identifying the patient's cephalic vein and brachial artery.  Using SonoSite guidance, the location of these vessels were marked out on the skin.     I made a transverse incision just below the level of the  antecubitum and dissected through the subcutaneous tissue and fascia to gain exposure of the brachial artery.  This was noted to be 3 mm in diameter externally.  This was dissected out proximally and distally and controlled with vessel loops .  I then dissected out the cephalic vein.  This was noted to be 3 mm in diameter externally.  The  distal segment of the vein was ligated with a  2-0 silk, and the vein was transected.  The proximal segment was interrogated with serial dilators.  The vein accepted up to a 4 mm dilator without any difficulty.  I then instilled the heparinized saline into the vein and clamped it.  At this point, I reset my exposure of the brachial artery.  The patient was given 3,000 units IV heparin.  I then placed the artery under tension proximally and distally.  I made an arteriotomy with a #11 blade, and then I extended the arteriotomy with a Potts scissor.  I injected heparinized saline proximal and distal to this arteriotomy.  The vein was then sewn to the artery in an end-to-side configuration with a running stitch of 6-0 Prolene.  Prior to completing this anastomosis, I allowed the vein and artery to backbleed.  There was no evidence of clot from any vessels.  I completed the anastomosis in the usual fashion and then released all vessel loops and clamps.  There was one larger median cubital branch that I tied off to improve the thrill in the fistula.    There was a palpable thrill in the venous outflow, and there was a dopplerable radial signal.  At this point, I irrigated out the surgical wound.  There was no further active bleeding.  The subcutaneous tissue was reapproximated with a running stitch of 3-0 Vicryl.  The skin was then reapproximated with a running subcuticular stitch of 4-0 Monocryk.  The skin was then cleaned, dried, and reinforced with Dermabond.  The patient tolerated this procedure well.    COMPLICATIONS: None  CONDITION: Stable   Marty Heck, MD [facility] Office: [phone] Pager: [phone]  10/07/2019, 11:40 AM

## 2019-10-07 NOTE — Transfer of Care (Signed)
Immediate Anesthesia Transfer of Care Note  Patient: Database administrator  Procedure(s) Performed: ARTERIOVENOUS (AV)  BRACHIOCEPHALIC FISTULA CREATION (Left Arm Upper) EXCHANGE OF A DIALYSIS CATHETER (Right Chest)  Patient Location: PACU  Anesthesia Type:General  Level of Consciousness: awake and alert   Airway & Oxygen Therapy: Patient Spontanous Breathing and Patient connected to face mask oxygen  Post-op Assessment: Report given to RN and Post -op Vital signs reviewed and stable  Post vital signs: Reviewed and stable  Last Vitals:  Vitals Value Taken Time  BP    Temp    Pulse    Resp    SpO2      Last Pain:  Vitals:   10/07/19 0808  TempSrc:   PainSc: 0-No pain      Patients Stated Pain Goal: 0 (72/25/75 0518)  Complications: No apparent anesthesia complications

## 2019-10-07 NOTE — Care Management Important Message (Signed)
Important Message  Patient Details  Name: Sophia Mcmahon MRN: [id number] Date of Birth: 03/09/1941   Medicare Important Message Given:  Yes     [name] 10/07/2019, 12:06 PM

## 2019-10-08 ENCOUNTER — Encounter (HOSPITAL_COMMUNITY): Payer: Self-pay | Admitting: Vascular Surgery

## 2019-10-10 ENCOUNTER — Encounter (HOSPITAL_COMMUNITY): Payer: Self-pay | Admitting: Oncology

## 2019-10-17 ENCOUNTER — Inpatient Hospital Stay (HOSPITAL_COMMUNITY): Payer: Medicare Other

## 2019-10-17 ENCOUNTER — Other Ambulatory Visit: Payer: Self-pay

## 2019-10-17 ENCOUNTER — Encounter (HOSPITAL_COMMUNITY): Payer: Self-pay | Admitting: Hematology

## 2019-10-17 ENCOUNTER — Inpatient Hospital Stay (HOSPITAL_COMMUNITY): Payer: Medicare Other | Attending: Hematology | Admitting: Hematology

## 2019-10-17 VITALS — BP 116/62 | HR 95 | Temp 97.5°F | Resp 20 | Ht 67.0 in | Wt 195.0 lb

## 2019-10-17 DIAGNOSIS — I129 Hypertensive chronic kidney disease with stage 1 through stage 4 chronic kidney disease, or unspecified chronic kidney disease: Secondary | ICD-10-CM

## 2019-10-17 DIAGNOSIS — E875 Hyperkalemia: Secondary | ICD-10-CM | POA: Diagnosis not present

## 2019-10-17 DIAGNOSIS — Z5112 Encounter for antineoplastic immunotherapy: Secondary | ICD-10-CM | POA: Diagnosis present

## 2019-10-17 DIAGNOSIS — G40909 Epilepsy, unspecified, not intractable, without status epilepticus: Secondary | ICD-10-CM

## 2019-10-17 DIAGNOSIS — N184 Chronic kidney disease, stage 4 (severe): Secondary | ICD-10-CM | POA: Diagnosis not present

## 2019-10-17 DIAGNOSIS — C9000 Multiple myeloma not having achieved remission: Secondary | ICD-10-CM

## 2019-10-17 DIAGNOSIS — Z992 Dependence on renal dialysis: Secondary | ICD-10-CM

## 2019-10-17 LAB — CBC WITH DIFFERENTIAL/PLATELET
Abs Immature Granulocytes: 0.03 10*3/uL (ref 0.00–0.07)
Basophils Absolute: 0.1 10*3/uL (ref 0.0–0.1)
Basophils Relative: 1 %
Eosinophils Absolute: 0 10*3/uL (ref 0.0–0.5)
Eosinophils Relative: 0 %
HCT: 37.4 % (ref 36.0–46.0)
Hemoglobin: 11.6 g/dL — ABNORMAL LOW (ref 12.0–15.0)
Immature Granulocytes: 0 %
Lymphocytes Relative: 30 %
Lymphs Abs: 2.3 10*3/uL (ref 0.7–4.0)
MCH: 33.3 pg (ref 26.0–34.0)
MCHC: 31 g/dL (ref 30.0–36.0)
MCV: 107.5 fL — ABNORMAL HIGH (ref 80.0–100.0)
Monocytes Absolute: 1.2 10*3/uL — ABNORMAL HIGH (ref 0.1–1.0)
Monocytes Relative: 15 %
Neutro Abs: 4.2 10*3/uL (ref 1.7–7.7)
Neutrophils Relative %: 54 %
Platelets: 248 10*3/uL (ref 150–400)
RBC: 3.48 MIL/uL — ABNORMAL LOW (ref 3.87–5.11)
RDW: 15.1 % (ref 11.5–15.5)
WBC: 7.9 10*3/uL (ref 4.0–10.5)
nRBC: 0 % (ref 0.0–0.2)

## 2019-10-17 LAB — IRON AND TIBC
Iron: 38 ug/dL (ref 28–170)
Saturation Ratios: 19 % (ref 10.4–31.8)
TIBC: 199 ug/dL — ABNORMAL LOW (ref 250–450)
UIBC: 161 ug/dL

## 2019-10-17 LAB — FOLATE: Folate: 4.2 ng/mL — ABNORMAL LOW (ref >5.9)

## 2019-10-17 LAB — COMPREHENSIVE METABOLIC PANEL
ALT: 14 U/L (ref 0–44)
AST: 54 U/L — ABNORMAL HIGH (ref 15–41)
Albumin: 2.2 g/dL — ABNORMAL LOW (ref 3.5–5.0)
Alkaline Phosphatase: 170 U/L — ABNORMAL HIGH (ref 38–126)
Anion gap: 9 (ref 5–15)
BUN: 19 mg/dL (ref 8–23)
CO2: 28 mmol/L (ref 22–32)
Calcium: 7.6 mg/dL — ABNORMAL LOW (ref 8.9–10.3)
Chloride: 100 mmol/L (ref 98–111)
Creatinine, Ser: 4.57 mg/dL — ABNORMAL HIGH (ref 0.44–1.00)
GFR calc Af Amer: 10 mL/min — ABNORMAL LOW (ref >60)
GFR calc non Af Amer: 9 mL/min — ABNORMAL LOW (ref >60)
Glucose, Bld: 104 mg/dL — ABNORMAL HIGH (ref 70–99)
Potassium: 3.6 mmol/L (ref 3.5–5.1)
Sodium: 137 mmol/L (ref 135–145)
Total Bilirubin: 0.5 mg/dL (ref 0.3–1.2)
Total Protein: 5.3 g/dL — ABNORMAL LOW (ref 6.5–8.1)

## 2019-10-17 LAB — FERRITIN: Ferritin: 231 ng/mL (ref 11–307)

## 2019-10-17 LAB — TYPE AND SCREEN
ABO/RH(D): A POS
Antibody Screen: NEGATIVE

## 2019-10-17 LAB — VITAMIN B12: Vitamin B-12: 758 pg/mL (ref 180–914)

## 2019-10-17 LAB — ABO/RH: ABO/RH(D): A POS

## 2019-10-17 MED ORDER — ACYCLOVIR 400 MG PO TABS: 400.0000 mg | ORAL_TABLET | Freq: Two times a day (BID) | ORAL | 6 refills | Status: AC

## 2019-10-17 NOTE — Assessment & Plan Note (Addendum)
1. Lambda Light Chain Disease: -Presented to the emergency room 09/21/2019 with acute on chronic renal failure. -Multiple myeloma labs completed on 09/22/2019 with negative SPEP, kappa free light chain 74.6, lambda free light chains 1062, ratio 0.07.  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 -Bone marrow biopsy completed on 09/30/2019: Hypercellular marrow with 14% plasma cell, weak lambda restriction. FISH: No high risk gene mutations.  Congo red staining positive for small foci of amyloid deposits. -Skeletal survey negative for lytic lesions. -Today, we discussed these findings in detail.  There is concern for multiple myeloma but also amyloidosis.  We will need to do a thorough work-up for possible amyloidosis including fat pad biopsy and a cardiac MRI. -Of note echocardiogram was completed on 09/22/2019 which showed an ejection fraction of 65 to 70% with severely increased left ventricular hypertrophy.  This could be due to uncontrolled hypertension or possible amyloid disease. -Per NCCN guidelines, PET/ CT scan has also been recommended for further work-up of lytic lesions. -We will also complete lab work-up with immunofixation electrophoresis, beta-2 microglobulin, troponin I and T, and pro BNP. -In order not to delay treatment, I have recommended to proceed with weekly  Cytoxan, Velcade, dexamethasone.  As this will not only treat multiple myeloma but also amyloidosis.  We discussed common side effects of drugs recommended.  She will be prescribed acyclovir for VZV prophylaxis. -Patient will return to clinic Friday to begin treatment.

## 2019-10-17 NOTE — Progress Notes (Signed)
[facility] CONSULT NOTE  Patient Care Team: Frances Maywood, FNP as PCP - General (Family Medicine)  CHIEF COMPLAINTS/PURPOSE OF CONSULTATION:  - Elevated Serum Light Chains   HISTORY OF PRESENTING ILLNESS:  Sophia Mcmahon 78 y.o. female presents today for consult regarding elevated serum light chains.  Her daughter accompanies her.  She has a past medical history significant for hypertension, hyperlipidemia, chronic kidney disease stage IV, hypothyroidism, and seizure disorder.  Patient presented to [facility] emergency room on 09/22/2019 with reports of shortness of breath, 30 pound weight gain, and significant edema that had developed over the last month.  She was found to be in acute on chronic renal failure and was transferred to [facility].  Her serum creatinine was noted to be 4.63 with a GFR is 9.  Acute on chronic renal failure was thought to be secondary to de compensated congestive heart failure, escalated diuresis, and ARB therapy.  While admitted to the hospital, decreasing renal function continued and she was started on CRRT.  Renal ultrasound was also obtained suggest chronic medical renal disease.  CT of abdomen pelvis was nonsignificant.  Myeloma labs were completed on 09/22/2019 with the findings of negative SPEP, kappa free light chains elevated at 74.6, lambda free light chains 1062, with a ratio 0.07.  Urine protein electrophoresis was positive for Bence-Jones protein lambda type.  Bone marrow biopsy and skeletal survey recommended due to these findings.  Pathology from bone marrow on 09/30/2019 suggests hypercellular bone marrow with 14% plasma cell.  Plasma cells display weak lambda light chain restriction.  Fish was negative for any high risk mutations.  Furthermore Congo red staining was performed positive for small foci of amyloid deposits.  Skeletal survey did not reveal any lytic lesions.  She continues on hemodialysis Tuesday, Thursday, Saturday.  Patient  reports she is feeling much better and her edema is now at baseline.  She denies any chest pain, shortness of breath, lightheadedness or dizziness.  Reports appetite is stable.  No change in bowel habits.  Denies any fevers, chills, night sweats.  No lymphadenopathy.  Patient and daughter are here to discuss findings further and treatment recommendations.  MEDICAL HISTORY:  Past Medical History:  Diagnosis Date  . CHF (congestive heart failure) ([place])   . Coronary artery disease   . Dyspnea   . High cholesterol   . Hypertension   . Hypothyroidism   . Renal disorder   . Seizures ([place])   . Sleep apnea     SURGICAL HISTORY: Past Surgical History:  Procedure Laterality Date  . ABDOMINAL HYSTERECTOMY    . AV FISTULA PLACEMENT Left 10/07/2019   Procedure: ARTERIOVENOUS (AV)  BRACHIOCEPHALIC FISTULA CREATION;  Surgeon: Marty Heck, MD;  Location: [place];  Service: Vascular;  Laterality: Left;  . BRAIN SURGERY     benign tumor removal  . EXCHANGE OF A DIALYSIS CATHETER Right 10/07/2019   Procedure: EXCHANGE OF A DIALYSIS CATHETER;  Surgeon: Marty Heck, MD;  Location: [place];  Service: Vascular;  Laterality: Right;    SOCIAL HISTORY: Social History   Socioeconomic History  . Marital status: Widowed    Spouse name: Not on file  . Number of children: 4  . Years of education: Not on file  . Highest education level: Not on file  Occupational History  . Occupation: reitred  Social Needs  . Financial resource strain: Not hard at all  . Food insecurity    Worry: Never true  Inability: Never true  . Transportation needs    Medical: No    Non-medical: No  Tobacco Use  . Smoking status: Never Smoker  . Smokeless tobacco: Never Used  Substance and Sexual Activity  . Alcohol use: Never    Frequency: Never  . Drug use: Never  . Sexual activity: Not Currently  Lifestyle  . Physical activity    Days per week: 7 days    Minutes per session: 30 min  . Stress: Only a  little  Relationships  . Social connections    Talks on phone: More than three times a week    Gets together: More than three times a week    Attends religious service: More than 4 times per year    Active member of club or organization: Yes    Attends meetings of clubs or organizations: More than 4 times per year    Relationship status: Widowed  . Intimate partner violence    Fear of current or ex partner: No    Emotionally abused: No    Physically abused: No    Forced sexual activity: No  Other Topics Concern  . Not on file  Social History Narrative  . Not on file    FAMILY HISTORY: Family History  Problem Relation Age of Onset  . Cancer Mother   . Cancer Sister   . Hypertension Father   . Cancer Brother   . Alcohol abuse Brother   . Hypertension Son   . Heart attack Son     ALLERGIES:  is allergic to sulfa antibiotics.  MEDICATIONS:  Current Outpatient Medications  Medication Sig Dispense Refill  . atorvastatin (LIPITOR) 80 MG tablet Take 80 mg by mouth daily.    Marland Kitchen docusate sodium (COLACE) 100 MG capsule Take 100 mg by mouth daily.    . furosemide (LASIX) 80 MG tablet Take 1 tablet (80 mg total) by mouth 2 (two) times daily. 60 tablet 0  . levothyroxine (SYNTHROID) 25 MCG tablet Take 1 tablet (25 mcg total) by mouth daily at 6 (six) AM. 30 tablet 0  . mometasone (NASONEX) 50 MCG/ACT nasal spray Place 1 spray into the nose 2 (two) times daily. 1 spray into each nostril twice daily    . omeprazole (PRILOSEC) 40 MG capsule Take 40 mg by mouth daily.    . phenytoin (DILANTIN) 100 MG ER capsule Take 3 capsules by mouth at bedtime.    Marland Kitchen acetaminophen (TYLENOL) 325 MG tablet Take 650 mg by mouth every 6 (six) hours as needed.     No current facility-administered medications for this visit.     REVIEW OF SYSTEMS:   Constitutional: Denies fevers, chills or abnormal night sweats Eyes: Denies blurriness of vision, double vision or watery eyes Ears, nose, mouth, throat, and  face: Denies mucositis or sore throat Respiratory: Denies cough, dyspnea or wheezes Cardiovascular: Denies palpitation, chest discomfort. + BLE edema  Gastrointestinal:  Denies nausea, heartburn or change in bowel habits Skin: Denies abnormal skin rashes Lymphatics: Denies new lymphadenopathy or easy bruising Neurological:Denies numbness, tingling or new weaknesses Behavioral/Psych: Mood is stable, no new changes  All other systems were reviewed with the patient and are negative.  PHYSICAL EXAMINATION: ECOG PERFORMANCE STATUS: 1 - Symptomatic but completely ambulatory  Vitals:   10/17/19 1330  BP: 116/62  Pulse: 95  Resp: 20  Temp: (!) 97.5 F (36.4 C)  SpO2: 98%   Filed Weights   10/17/19 1330  Weight: 195 lb (88.5 kg)  GENERAL:alert, no distress and comfortable SKIN: skin color, texture, turgor are normal, no rashes or significant lesions EYES: normal, conjunctiva are pink and non-injected, sclera clear OROPHARYNX:no exudate, no erythema and lips, buccal mucosa, and tongue normal  NECK: supple, thyroid normal size, non-tender, without nodularity LYMPH:  no palpable lymphadenopathy in the cervical, axillary or inguinal LUNGS: clear to auscultation and percussion with normal breathing effort HEART: regular rate & rhythm and no murmurs and no lower extremity edema ABDOMEN:abdomen soft, non-tender and normal bowel sounds Musculoskeletal:no cyanosis of digits and no clubbing  PSYCH: alert & oriented x 3 with fluent speech NEURO: no focal motor/sensory deficits  LABORATORY DATA:  I have reviewed the data as listed Lab Results  Component Value Date   WBC 9.1 10/05/2019   HGB 8.8 (L) 10/05/2019   HCT 27.6 (L) 10/05/2019   MCV 104.5 (H) 10/05/2019   PLT 288 10/05/2019     Chemistry      Component Value Date/Time   NA 136 10/07/2019 0437   K 3.6 10/07/2019 0437   CL 98 10/07/2019 0437   CO2 29 10/07/2019 0437   BUN 25 (H) 10/07/2019 0437   CREATININE 3.44 (H)  10/07/2019 0437      Component Value Date/Time   CALCIUM 7.3 (L) 10/07/2019 0437   ALKPHOS 149 (H) 09/27/2019 0355   AST 61 (H) 09/27/2019 0355   ALT 36 09/27/2019 0355   BILITOT 0.3 09/27/2019 0355       RADIOGRAPHIC STUDIES: I have personally reviewed the radiological images as listed and agreed with the findings in the report. Ct Abdomen Pelvis Wo Contrast  Result Date: 09/24/2019 CLINICAL DATA:  Acute renal failure. Unable to visualize left kidney on ultrasound. EXAM: CT ABDOMEN AND PELVIS WITHOUT CONTRAST TECHNIQUE: Multidetector CT imaging of the abdomen and pelvis was performed following the standard protocol without IV contrast. COMPARISON:  None. FINDINGS: Artifact over the posterior field of view over the lower thorax and upper abdomen possibly due to patient's arms. Lower chest: Moderate size right effusion and small left effusion with associated bibasilar atelectasis. Calcified plaque over the left anterior descending and right coronary arteries. Calcified plaque over the distal descending thoracic aorta. Generalized subcutaneous edema over the thorax. Hepatobiliary: Mild nodular contour to the liver. No focal liver mass. Gallbladder and biliary tree are normal. Pancreas: Normal. Spleen: Normal. Adrenals/Urinary Tract: Adrenal glands are normal. Kidneys are normal in size without hydronephrosis. No definite nephrolithiasis. Foley catheter present within a decompressed bladder. Stomach/Bowel: Stomach and small bowel are normal. Colon is within normal. Appendix is not well visualized. Vascular/Lymphatic: Mild-to-moderate calcified plaque over the abdominal aorta. No definite adenopathy. Reproductive: Suggestion previous hysterectomy. Other: No significant free fluid or focal inflammatory change. Diffuse subcutaneous edema. Musculoskeletal: Degenerative change of the spine and hips. IMPRESSION: No acute findings in the abdomen/pelvis. Moderate size right effusion and small left effusion with  associated bibasilar atelectasis. Mild nodular contour to the liver which may be due to cirrhosis. Aortic Atherosclerosis (ICD10-I70.0). Atherosclerotic coronary artery disease. Electronically Signed   By: Marin Olp M.D.   On: 09/24/2019 12:27   US Abdomen Complete  Result Date: 09/22/2019 CLINICAL DATA:  Acute renal failure EXAM: ABDOMEN ULTRASOUND COMPLETE COMPARISON:  None. FINDINGS: Gallbladder: No gallstones or wall thickening visualized. No sonographic Murphy sign noted by sonographer. Common bile duct: Diameter: Not visualized Liver: No focal lesion identified. Within normal limits in parenchymal echogenicity. Portal vein is patent on color Doppler imaging with normal direction of blood flow towards the liver.  IVC: No abnormality visualized. Pancreas: Visualized portion unremarkable. Spleen: Not visualized Right Kidney: Length: 10.5 cm, difficult to visualize. Increased echotexture diffusely. No hydronephrosis seen. Left Kidney: Length: Not visualized. Abdominal aorta: No aneurysm visualized. Other findings: Moderate right pleural effusion noted. Small amount of ascites seen. IMPRESSION: Limited study due to bowel gas and body habitus. Right kidney visualized and appears echogenic suggesting chronic medical renal disease. Left kidney not visualized. Small amount of ascites.  Right pleural effusion. Electronically Signed   By: Rolm Baptise M.D.   On: 09/22/2019 11:17   Dg Chest Port 1 View  Result Date: 10/07/2019 CLINICAL DATA:  Status post dialysis catheter insertion. EXAM: PORTABLE CHEST 1 VIEW COMPARISON:  09/29/2019. FINDINGS: Exchange of right IJ catheter for a tunnel catheter with catheter tip at the caval to atrial junction. No signs of pneumothorax. Bilateral effusions and basilar airspace disease persists. Cardiomediastinal contours are unchanged. Spinal and glenohumeral degenerative changes are redemonstrated. IMPRESSION: 1. The right IJ catheter is in good position with catheter tip at  the caval to atrial junction. No pneumothorax. 2. Persistent effusions and basilar airspace disease. Electronically Signed   By: Zetta Bills M.D.   On: 10/07/2019 12:52   Dg Chest Port 1 View  Result Date: 09/29/2019 CLINICAL DATA:  Leukocytosis.  Shortness of breath EXAM: PORTABLE CHEST 1 VIEW COMPARISON:  Two days ago FINDINGS: Haziness of the bilateral lower chest is unchanged. Cardiomegaly without edema in the apical lungs. No pneumothorax. Right IJ line with tip at the SVC. Notable advanced glenohumeral osteoarthritis with loose body on the right. IMPRESSION: Stable opacification at the bases from atelectasis/pneumonia and small pleural effusion. Electronically Signed   By: Monte Fantasia M.D.   On: 09/29/2019 10:52   Dg Chest Port 1 View  Result Date: 09/27/2019 CLINICAL DATA:  Status post thoracentesis. EXAM: PORTABLE CHEST 1 VIEW COMPARISON:  Same day. FINDINGS: Stable cardiomegaly. Right internal jugular catheter is unchanged. No pneumothorax is noted. Right pleural effusion is significantly smaller status post thoracentesis. Stable left basilar atelectasis and effusion is noted. Bony thorax is unremarkable. IMPRESSION: Right pleural effusion is significantly smaller status post thoracentesis. No pneumothorax is noted. Electronically Signed   By: Marijo Conception M.D.   On: 09/27/2019 15:00   Dg Chest Port 1 View  Result Date: 09/27/2019 CLINICAL DATA:  Acute on chronic renal failure.  Pleural effusion. EXAM: PORTABLE CHEST 1 VIEW COMPARISON:  Single-view of the chest 10/0 2 scratch the single view of the chest and CT abdomen and pelvis 09/24/2019. FINDINGS: Right IJ approach dialysis catheter is unchanged. Moderately large to large right pleural effusion and moderate left pleural effusion are again seen and not notably changed. Heart size is enlarged. No edema. Atherosclerosis noted. IMPRESSION: No marked change in moderately large to large right and small to moderate left pleural effusions  with associated compressive atelectasis. Cardiomegaly without edema. Atherosclerosis. Electronically Signed   By: Inge Rise M.D.   On: 09/27/2019 09:11   Dg Chest Port 1 View  Result Date: 09/24/2019 CLINICAL DATA:  Central line placement EXAM: PORTABLE CHEST 1 VIEW COMPARISON:  09/22/2019 FINDINGS: Interval placement of right-sided central venous catheter, with tip overlying the SVC. No right pneumothorax. Enlarged cardiomediastinal silhouette with vascular congestion. Asymmetric opacity in the right thorax is likely layering effusion. Left pleural effusion remains. Continued dense consolidation at both bases. Possible calcified loose body at the right shoulder. IMPRESSION: 1. Right-sided central venous catheter tip over the SVC. No pneumothorax 2. Cardiomegaly with vascular congestion  3. Bilateral pleural effusions, likely layering on the right side. Persistent dense airspace disease at both lung bases. Electronically Signed   By: Donavan Foil M.D.   On: 09/24/2019 02:00   Dg Chest Port 1 View  Result Date: 09/22/2019 CLINICAL DATA:  Shortness of breath. History of CHF, stopped diuretics. 30 pound weight gain. EXAM: PORTABLE CHEST 1 VIEW COMPARISON:  None. FINDINGS: Cardiomegaly. There is moderate pulmonary edema. Hazy lung base opacities likely combination of pleural effusions and compressive atelectasis. No pneumothorax or confluent airspace disease in the aerated lungs. Advanced degenerative change of both shoulders. IMPRESSION: CHF. Cardiomegaly with moderate pleural effusions and pulmonary edema. Electronically Signed   By: Keith Rake M.D.   On: 09/22/2019 00:40   Dg Bone Survey Met  Result Date: 10/03/2019 CLINICAL DATA:  Light chain disease. EXAM: METASTATIC BONE SURVEY COMPARISON:  Metastatic bone survey dated 09/28/2019 FINDINGS: There are no lytic lesions in the skeleton. There are severe arthritic changes of both shoulders with multiple loose bodies in the right glenohumeral  joint. There are arthritic changes of both knees. Chronic degenerative facet arthritis in the cervical spine. Since the prior bone survey of 09/28/2019 the patient has developed small bilateral pleural effusions. Central line in place with the tip at the level of the carina in good position. IMPRESSION: 1. No lytic lesions in the skeleton. 2. New small bilateral pleural effusions. 3. Severe arthritic changes of both shoulders with multiple loose bodies. Electronically Signed   By: Lorriane Shire M.D.   On: 10/03/2019 17:04   Dg Bone Survey Met  Result Date: 09/28/2019 CLINICAL DATA:  Elevated immunoglobulin free light chain EXAM: METASTATIC BONE SURVEY COMPARISON:  None. FINDINGS: There is diffuse osteopenia seen throughout. Skull: No focal lytic or blastic osseous lesion. Cervical Spine: No focal lytic or blastic osseous lesions. Multilevel degenerative changes are seen. Thoracic Spine: Multilevel degenerative changes are seen. No focal lytic or blastic lesions are seen. Anterior flowing osteophytes are noted. Chest: Mild cardiomegaly. Aortic calcifications. A right-sided central venous catheter seen with the tip at the upper SVC. A probable small left pleural effusion is seen. No lytic or blastic lesions. Lumbar Spine: Disc loss height with facet arthrosis most notable at L4-L5 and L5-S1. No lytic or blastic lesions. Scattered vascular calcifications. Pelvis: No focal lytic or blastic osseous lesion. Bilateral hip osteoarthritis. Right Upper Extremity: Glenohumeral joint and AC joint arthrosis seen with joint space loss. Periosteal reaction seen along the lateral humerus. Enthesophytes seen at the elbow. No focal lytic or blastic osseous lesion. Left Upper Extremity: Glenohumeral joint and AC joint arthrosis. These fight seen at the elbow. No focal lytic or blastic osseous lesion. Right Lower Extremity: Right hip and knee osteoarthritis seen superior joint space and osteophyte formation. No focal lytic or  blastic osseous lesion. Left Lower Extremity: Left knee and hip osteoarthritis is seen. No focal lytic or blastic osseous lesion. IMPRESSION: No lytic or blastic lesions throughout. Diffuse osteopenia Electronically Signed   By: Prudencio Pair M.D.   On: 09/28/2019 20:51   Dg Fluoro Guide Cv Line-no Report  Result Date: 10/07/2019 Fluoroscopy was utilized by the requesting physician.  No radiographic interpretation.   Vas [place] Upper Ext Vein Mapping (pre-op Avf)  Result Date: 10/05/2019 UPPER EXTREMITY VEIN MAPPING  Indications: Pre-access. Comparison Study: No prior study. Performing Technologist: Maudry Mayhew MHA, RDMS, RVT, RDCS  Examination Guidelines: A complete evaluation includes B-mode imaging, spectral Doppler, color Doppler, and power Doppler as needed of all accessible portions of  each vessel. Bilateral testing is considered an integral part of a complete examination. Limited examinations for reoccurring indications may be performed as noted. +-----------------+-------------+----------+---------+ Left Cephalic    Diameter (cm)Depth (cm)Findings  +-----------------+-------------+----------+---------+ Shoulder             0.30        0.69             +-----------------+-------------+----------+---------+ Prox upper arm       0.32        0.81             +-----------------+-------------+----------+---------+ Mid upper arm        0.31        0.59   branching +-----------------+-------------+----------+---------+ Dist upper arm       0.36        0.75             +-----------------+-------------+----------+---------+ Antecubital fossa    0.38        0.34   branching +-----------------+-------------+----------+---------+ Prox forearm         0.26        0.63             +-----------------+-------------+----------+---------+ Mid forearm          0.18        0.56   branching +-----------------+-------------+----------+---------+ Dist forearm         0.21         0.22             +-----------------+-------------+----------+---------+ Wrist                0.24        0.28             +-----------------+-------------+----------+---------+ +-----------------+-------------+----------+--------------+ Left Basilic     Diameter (cm)Depth (cm)   Findings    +-----------------+-------------+----------+--------------+ Prox upper arm       0.51                              +-----------------+-------------+----------+--------------+ Mid upper arm        0.45                              +-----------------+-------------+----------+--------------+ Dist upper arm       0.33                 branching    +-----------------+-------------+----------+--------------+ Antecubital fossa    0.18                 branching    +-----------------+-------------+----------+--------------+ Prox forearm         0.12                              +-----------------+-------------+----------+--------------+ Mid forearm                             not visualized +-----------------+-------------+----------+--------------+ Distal forearm                          not visualized +-----------------+-------------+----------+--------------+ Wrist                                   not visualized +-----------------+-------------+----------+--------------+ *See table(s) above  for measurements and observations.  Diagnosing physician: Deitra Mayo MD Electronically signed by Deitra Mayo MD on 10/05/2019 at 12:00:09 PM.    Final    Vas [place] Upper Extremity Venous Duplex  Result Date: 09/28/2019 UPPER VENOUS STUDY  Indications: Edema, and Pain Comparison Study: No prior study. Performing Technologist: Maudry Mayhew MHA, RDMS, RVT, RDCS  Examination Guidelines: A complete evaluation includes B-mode imaging, spectral Doppler, color Doppler, and power Doppler as needed of all accessible portions of each vessel. Bilateral testing is considered an integral  part of a complete examination. Limited examinations for reoccurring indications may be performed as noted.  Right Findings: +----------+------------+---------+-----------+----------+-------+ RIGHT     CompressiblePhasicitySpontaneousPropertiesSummary +----------+------------+---------+-----------+----------+-------+ IJV                      Yes       Yes                      +----------+------------+---------+-----------+----------+-------+ Subclavian    Full       Yes       Yes                      +----------+------------+---------+-----------+----------+-------+ Axillary      Full       Yes       Yes                      +----------+------------+---------+-----------+----------+-------+ Brachial      Full       Yes       Yes                      +----------+------------+---------+-----------+----------+-------+ Radial        Full                                          +----------+------------+---------+-----------+----------+-------+ Ulnar         Full                                          +----------+------------+---------+-----------+----------+-------+ Cephalic      Full                                          +----------+------------+---------+-----------+----------+-------+ Basilic       Full                                          +----------+------------+---------+-----------+----------+-------+  Left Findings: +----------+------------+---------+-----------+----------+--------------+ LEFT      CompressiblePhasicitySpontaneousProperties   Summary     +----------+------------+---------+-----------+----------+--------------+ Subclavian                                          Not visualized +----------+------------+---------+-----------+----------+--------------+  Summary:  Right: No evidence of deep vein thrombosis in the upper extremity. No evidence of superficial vein thrombosis in the upper extremity.  *See table(s) above  for measurements and observations.  Diagnosing physician: Curt Jews  MD Electronically signed by Curt Jews MD on 09/28/2019 at 4:34:29 PM.    Final     ASSESSMENT & PLAN:  Multiple myeloma ([place]) 1. Lambda Light Chain Disease -Presented to the emergency room 09/21/2019 with acute on chronic renal failure. -Multiple myeloma labs completed on 09/22/2019 with negative SPEP, kappa free light chain 74.6, lambda free light chains 1062, ratio 0.07.  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 -Bone marrow biopsy completed on 09/30/2019: Hypercellular marrow with 14% plasma cell, weak lambda restriction. FISH: No high risk gene mutations.  [place] red staining positive for small foci of amyloid deposits. -Skeletal survey negative for lytic lesions. -Today, we discussed these findings in detail.  There is concern for multiple myeloma but also amyloidosis.  We will need to do a thorough work-up for possible amyloidosis including fat pad biopsy and a cardiac MRI. -Of note echocardiogram was completed on 09/22/2019 which showed an ejection fraction of 65 to 70% with severely increased left ventricular hypertrophy.  This could be due to uncontrolled hypertension or possible amyloid disease. -Per NCCN guidelines, PET/ CT scan has also been recommended for further work-up of lytic lesions. -We will also complete lab work-up with immunofixation electrophoresis, beta-2 microglobulin, troponin I and T, and pro BNP. -In order not to delay treatment, I have recommended to proceed with weekly  Cytoxan, Velcade, dexamethasone.  As this will not only treat multiple myeloma but also amyloidosis.  We discussed common side effects of drugs recommended.  She will be prescribed acyclovir for VZV prophylaxis. -Patient will return to clinic Friday to begin treatment.   Addendum: -I have independently elicited the history and examined this patient and agree with the above assessment and plan.  We have strongly  recommended abdominal fat pad biopsy.  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  Unfortunately kidney biopsy was not done.  She was also recommended a PET CT scan which she will have it done in [employer].  We have recommended treatment with CyBorD regimen as soon as possible.  We discussed the side effects in detail.  We will also consider doing a cardiac MRI.  Echocardiogram showed left ventricular hypertrophy.  Orders Placed This Encounter  Procedures  . MR CARDIAC MORPHOLOGY W WO CONTRAST    Standing Status:   Future    Standing Expiration Date:   12/16/2020    Order Specific Question:   If indicated for the ordered procedure, I authorize the administration of contrast media per Radiology protocol    Answer:   Yes    Order Specific Question:   What is the patient's sedation requirement?    Answer:   No Sedation    Order Specific Question:   Does the patient have a pacemaker or implanted devices?    Answer:   No    Order Specific Question:   Radiology Contrast Protocol - do NOT remove file path    Answer:   \\[email or web address]    Order Specific Question:   Preferred imaging location?    Answer:   [facility] (table limit-350lbs)  . NM PET Image Initial (PI) Skull Base To Thigh    Standing Status:   Future    Standing Expiration Date:   10/16/2020    Order Specific Question:   ** REASON FOR EXAM (FREE TEXT)    Answer:   Multiple Myeloma    Order Specific Question:   If indicated for the ordered procedure, I authorize the administration of a radiopharmaceutical  per Radiology protocol    Answer:   Yes    Order Specific Question:   Preferred imaging location?    Answer:   [facility]    Order Specific Question:   Radiology Contrast Protocol - do NOT remove file path    Answer:   \\[email or web address]  . Pro b natriuretic peptide  . Troponin T  . Troponin I  . CBC with Differential    Standing Status:   Future     Number of Occurrences:   1    Standing Expiration Date:   10/16/2020  . Comprehensive metabolic panel    Standing Status:   Future    Number of Occurrences:   1    Standing Expiration Date:   10/16/2020  . Immunofixation electrophoresis    Standing Status:   Future    Number of Occurrences:   1    Standing Expiration Date:   10/16/2020  . Beta 2 microglobuline, serum    Standing Status:   Future    Number of Occurrences:   1    Standing Expiration Date:   10/16/2020  . Iron and TIBC    Standing Status:   Future    Number of Occurrences:   1    Standing Expiration Date:   10/16/2020  . Ferritin    Standing Status:   Future    Number of Occurrences:   1    Standing Expiration Date:   10/16/2020  . Vitamin B12    Standing Status:   Future    Number of Occurrences:   1    Standing Expiration Date:   10/16/2020  . Folate    Standing Status:   Future    Number of Occurrences:   1    Standing Expiration Date:   10/16/2020  . Type and screen    All questions were answered. The patient knows to call the clinic with any problems, questions or concerns. I spent 40 minutes counseling the patient face to face. The total time spent in the appointment was 60 minutes and more than 50% was on counseling.

## 2019-10-17 NOTE — Patient Instructions (Addendum)
[place] at [facility] Discharge Instructions  You were seen today by Dr. Delton Coombes. He went over your history, family history and how you've been feeling lately. He will schedule you for a PET scan to further evaluate your condition. He will also get you scheduled for a fat pad biopsy. He will get you scheduled to start treatment this week. He will see you back in 1 week for labs, treatment and follow up.   Thank you for choosing [place] at [facility] to provide your oncology and hematology care.  To afford each patient quality time with our provider, please arrive at least 15 minutes before your scheduled appointment time.   If you have a lab appointment with the [facility] please come in thru the  Main Entrance and check in at the main information desk  You need to re-schedule your appointment should you arrive 10 or more minutes late.  We strive to give you quality time with our providers, and arriving late affects you and other patients whose appointments are after yours.  Also, if you no show three or more times for appointments you may be dismissed from the clinic at the providers discretion.     Again, thank you for choosing [facility].  Our hope is that these requests will decrease the amount of time that you wait before being seen by our physicians.       _____________________________________________________________  Should you have questions after your visit to [facility], please contact our office at [phone] between the hours of 8:00 a.m. and 4:30 p.m.  Voicemails left after 4:00 p.m. will not be returned until the following business day.  For prescription refill requests, have your pharmacy contact our office and allow 72 hours.    Cancer Center Support Programs:   > Cancer Support Group  2nd Tuesday of the month 1pm-2pm, Journey Room

## 2019-10-18 ENCOUNTER — Other Ambulatory Visit (HOSPITAL_COMMUNITY): Payer: Self-pay | Admitting: *Deleted

## 2019-10-18 DIAGNOSIS — C9000 Multiple myeloma not having achieved remission: Secondary | ICD-10-CM

## 2019-10-18 LAB — TROPONIN T: Troponin T TROPT: 0.308 ng/mL (ref <0.011)

## 2019-10-18 LAB — BETA 2 MICROGLOBULIN, SERUM: Beta-2 Microglobulin: 20.8 mg/L — ABNORMAL HIGH (ref 0.6–2.4)

## 2019-10-18 NOTE — Progress Notes (Signed)
START ON PATHWAY REGIMEN - Multiple Myeloma and Other Plasma Cell Dyscrasias     A cycle is every 28 days:     Dexamethasone      Bortezomib      Cyclophosphamide   **Always confirm dose/schedule in your pharmacy ordering system**  Patient Characteristics: Newly Diagnosed, Transplant Ineligible or Refused, Standard Risk R-ISS Staging: Unknown Disease Classification: Newly Diagnosed Is Patient Eligible for Transplant<= Transplant Ineligible or Refused Risk Status: Standard Risk Intent of Therapy: Non-Curative / Palliative Intent, Discussed with Patient

## 2019-10-19 LAB — IMMUNOFIXATION ELECTROPHORESIS
IgA: 119 mg/dL (ref 64–422)
IgG (Immunoglobin G), Serum: 694 mg/dL (ref 586–1602)
IgM (Immunoglobulin M), Srm: 88 mg/dL (ref 26–217)
Total Protein ELP: 4.7 g/dL — ABNORMAL LOW (ref 6.0–8.5)

## 2019-10-20 MED ORDER — PROCHLORPERAZINE MALEATE 10 MG PO TABS: 10.0000 mg | ORAL_TABLET | Freq: Four times a day (QID) | ORAL | 1 refills | Status: AC | PRN

## 2019-10-20 MED ORDER — DEXAMETHASONE 4 MG PO TABS: ORAL_TABLET | ORAL | 3 refills | Status: DC

## 2019-10-20 NOTE — Patient Instructions (Addendum)
[place] are diagnosed with multiple myeloma.  You will be treated weekly with a regimen of chemotherapy drugs and a steroid - bortezomib (Velcade), cyclophosphamide (Cytoxan), and dexamethasone (steroid).  The intent of treatment is to control your disease and help alleviate any symptoms you may be having related to this disease.  You will see the doctor regularly throughout treatment.  We will obtain blood work from you prior to every treatment and monitor your results to make sure it is safe to give your treatment. The doctor monitors your response to treatment by the way you are feeling, your blood work, and by obtaining scans periodically.  There will be wait times while you are here for treatment.  It will take about 30 minutes to 1 hour for your lab work to result.  Then there will be wait times while pharmacy mixes your medications.   Bortezomib (Velcade)  About This Drug  Bortezomib is used to treat cancer. It is given in the vein (IV) or by a shot under the skin (subcutaneously).  You will receive this injection under your skin.  Possible Side Effects  . Bone marrow suppression. Decrease in the number of white blood cells, red blood cells, and platelets. This may raise your risk of infection, make you tired and weak (fatigue), and raise your risk of bleeding.  . Nausea and vomiting (throwing up)  . Constipation (not able to move bowels)  . Diarrhea (loose bowel movements)  . Fever  . Tiredness  . Decreased appetite (decreased hunger)  . Effects on the nerves are called peripheral neuropathy. You may feel numbness, tingling, or pain in your hands and feet. It may be hard for you to button your clothes, open jars, or walk as usual. The effect on the nerves may get worse with more doses of the drug. These effects get better in some people after the drug is stopped but it does not get better in all people.  . Rash  Note: Each of  the side effects above was reported in 20% or greater of patients treated with bortezomib. Not all possible side effects are included above.  Warnings and Precautions  . Severe peripheral neuropathy  . Low blood pressure  . Congestive heart failure - your heart has less ability to pump blood properly.  . Trouble breathing because of fluid build-up and/or inflammation in your lungs  . Nausea, vomiting, diarrhea and constipation which sometimes requires treatment to help lessen these side effects. There is also an increased risk of developing a partial or complete blockage of your small and/or large intestine.  . Changes in your central nervous system can happen. The central nervous system is made up of your brain and spinal cord. You could feel extreme tiredness, agitation, confusion, have hallucinations (see or hear things that are not there), trouble understanding or speaking, loss of control of your bowels or bladder, eyesight changes, numbness or lack of strength to your arms, legs, face, or body, seizures or coma. If you start to have any of these symptoms let your doctor know right away.  . Tumor lysis syndrome: This drug may act on the cancer cells very quickly. This may affect how your kidneys work.  . Changes in your liver function Increased risk of a syndrome that affects your red blood cells, platelets and blood vessels in your kidneys, which can cause kidney failure and be life-threatening.  Important Information  . This drug may be present  in the saliva, tears, sweat, urine, stool, vomit, semen, and vaginal secretions. Talk to your doctor and/or your nurse about the necessary precautions to take during this time.  . This drug may impair your ability to drive or use machinery. Use caution and tell your nurse or doctor if you feel dizzy, very sleepy, and/or experience low blood pressure.  Treating Side Effects  . Manage tiredness by pacing your activities for the  day.  . Be sure to include periods of rest between energy-draining activities.  . To decrease the risk of infection, wash your hands regularly.  . Avoid close contact with people who have a cold, the flu, or other infections.  . Take your temperature as your doctor or nurse tells you, and whenever you feel like you may have a fever.  . To help decrease the risk of bleeding, use a soft toothbrush. Check with your nurse before using dental floss.  . Be very careful when using knives or tools.  . Use an electric shaver instead of a razor.  . Ask your doctor or nurse about medicines that are available to help stop or lessen constipation.  . If you are not able to move your bowels, check with your doctor or nurse before you use enemas, laxatives, or suppositories.  . Drink plenty of fluids (a minimum of eight glasses per day is recommended).  . If you throw up or have loose bowel movements, you should drink more fluids so that you do not become dehydrated (lack of water in the body from losing too much fluid).  . If you have diarrhea, eat low-fiber foods that are high in protein and calories and avoid foods that can irritate your digestive tracts or lead to cramping.  . Ask your nurse or doctor about medicine that can lessen or stop your diarrhea.  . To help with nausea and vomiting, eat small, frequent meals instead of three large meals a day. Choose foods and drinks that are at room temperature. Ask your nurse or doctor about other helpful tips and medicine that is available to help stop or lessen these symptoms.  . To help with decreased appetite, eat foods high in calories and protein, such as meat, poultry, fish, dry beans, tofu, eggs, nuts, milk, yogurt, cheese, ice cream, pudding, and nutritional supplements.  . Consider using sauces and spices to increase taste. Daily exercise, with your doctor's approval, may increase your appetite.  . If you have numbness and tingling  in your hands and feet, be careful when cooking, walking, and handling sharp objects and hot liquids.  . If you get a rash do not put anything on it unless your doctor or nurse says you may. Keep the area around the rash clean and dry. Ask your doctor for medicine if your rash bothers you.  Food and Drug Interactions  . This drug may interact with grapefruit and grapefruit juice. Talk to your doctor as this could make side effects worse.  . Check with your doctor or pharmacist about all other prescription medicines and over-the-counter medicines and dietary supplements (vitamins, minerals, herbs and others) you are taking before starting this medicine as there are known drug interactions with bortezomib. Also, check with your doctor or pharmacist before starting any new prescription or over-the-counter medicines, or dietary supplements to make sure that there are no interactions.  . Avoid the use of St. John's Wort with bortezomib as this may lower the levels of the drug in your body, which can  make it less effective.  When to Call the Doctor  Call your doctor or nurse if you have any of these symptoms and/or any new or unusual symptoms:  . Fever of 100.4 F (38 C) or higher  . Chills  . Tiredness that interferes with your daily activities  . Feeling dizzy or lightheaded  . Feeling that your heart is beating in a fast or not normal way (palpitations)  . Cough  . Wheezing or trouble breathing  . Easy bleeding or bruising  . Confusion and/or agitation  . Hallucinations  . Trouble understanding or speaking  . Blurry vision or changes in your eyesight  . Numbness or lack of strength to your arms, legs, face, or body  . Symptoms of a seizure such as confusion, blacking out, passing out, loss of hearing or vision, blurred vision, unusual smells or tastes (such as burning rubber), trouble talking, tremors or shaking in parts or all of the body, repeated body movements,  tense muscles that do not relax, and loss of control of urine and bowels. If you or your family member suspects you are having a seizure, call 911 right away.  . Nausea that stops you from eating or drinking and/or is not relieved by prescribed medicines  . Throwing up more than 3 times a day  . Lasting loss of appetite or rapid weight loss of five pounds in a week  . No bowel movement in 3 days or when you feel uncomfortable.  . Abdominal pain that does not go away  . Diarrhea, 4 times in one day or diarrhea with lack of strength or a feeling of being dizzy  . Numbness, tingling, or pain your hands and feet  . Swelling of legs, ankles, and/or feet  . Weight gain of 5 pounds in one week (fluid retention)  . Decreased urine, or very dark urine  . New rash and/or itching  . Rash that is not relieved by prescribed medicines  . Signs of tumor lysis: Confusion or agitation, decreased urine, nausea/vomiting, diarrhea, muscle cramping, numbness and/or tingling, seizures.  . Signs of possible liver problems: dark urine, pale bowel movements, bad stomach pain, feeling very tired and weak, unusual itching, or yellowing of the eyes or skin  . If you think you are pregnant or may have impregnated your partner  Reproduction Warnings  . Pregnancy warning: This drug can have harmful effects on the unborn baby. Women of child bearing potential should use effective methods of birth control during your cancer treatment and for at least 7 months after treatment. Men with female partners of childbearing potential should use effective methods of birth control during your cancer treatment and for at least 4 months after your cancer treatment. Let your doctor know right away if you think you may be pregnant or may have impregnated your partner.  . Breastfeeding warning: Women should not breastfeed during treatment and for 2 months month after treatment because this drug could enter the breast  milk and cause harm to a breastfeeding baby.  . Fertility warning: In men and women both, this drug may affect your ability to have children in the future. Talk with your doctor or nurse if you plan to have children. Ask for information on sperm or egg banking.   Dexamethasone (Decadron)  About This Drug  Dexamethasone is used to treat cancer, to decrease inflammation and sometimes used before and after chemotherapy to prevent or treat nausea and/or vomiting. It is given in the vein (  IV) or orally (by mouth).  Possible Side Effects  . Headache  . High blood pressure  . Abnormal heart beat  . Tiredness and weakness  . Changes in mood, which may include depression or a feeling of extreme well-being  . Trouble sleeping  . Increased sweating  . Increased appetite (increased hunger)  . Weight gain  . Increase risk of infections  . Pain in your abdomen  . Nausea  . Skin changes such as rash, dryness, redness  . Blood sugar levels may change  . Electrolyte changes  . Swelling of your legs, ankles and/or feet  . Changes in your liver function  . You may be at risk for cataracts, glaucoma or infections of the eye  . Muscle loss and / or weakness (lack of muscle strength)  . Increased risk of developing osteoporosis- your bones may become weak and brittle  Note: Not all possible side effects are included above.  Warnings and Precautions  . This drug may cause you to feel irritable, nervous or restless.  . Allergic reactions, including anaphylaxis are rare but may happen in some patients. Signs of allergic reaction to this drug may be swelling of the face, feeling like your tongue or throat are swelling, trouble breathing, rash, itching, fever, chills, feeling dizzy, and/or feeling that your heart is beating in a fast or not normal way. If this happens, do not take another dose of this drug. You should get urgent medical treatment.  . High blood pressure and  changes in electrolytes, which can cause fluid build-up around your heart, lungs or elsewhere.  . Increased risk of developing a hole in your stomach, small, and/or large intestine if you have ulcers in the lining of your stomach and/or intestine, or have diverticulitis, ulcerative colitis and/or other diseases that affect the gastrointestinal tract.  . Effects on the endocrine glands including the pituitary, adrenals or thyroid during or after use of this medication.  . Changes in the tissue of the heart, that can cause your heart to have less ability to pump blood. You may be short of breath or our arms, hands, legs and feet may swell.  . Increased risk of heart attack.  . Severe depression and other psychiatric disorders such as mood changes.  . Burning, pain and itching around your anus may happen when this drug is given in the vein too rapidly (IV). It usually happens suddenly and resolves in less than 1 minute.  Important Information  . Talk to your doctor or your nurse before stopping this medication, it should be stopped gradually. Depending on the dose and length of treatment, you could experience serious side effects if stopped abruptly (suddenly).  . Talk to your doctor before receiving any vaccinations during your treatment. Some vaccinations are not recommended while receiving dexamethasone.  How to Take Your Medication  . For Oral (by mouth): You can take the medicine with or without food. If you have nausea or upset stomach, take it with food.  . Missed dose: If you miss a dose, do not take 2 doses at the same time or extra doses. . If you vomit a dose, take your next dose at the regular time. Do not take 2 doses at the same time  . Handling: Wash your hands after handling your medicine, your caretakers should not handle your medicine with bare hands and should wear latex gloves.  . Storage: Store this medicine in the original container at room temperature.  Protect from moisture  and light. Discuss with your nurse or your doctor how to dispose of unused medicine.  Treating Side Effects  . Drink plenty of fluids (a minimum of eight glasses per day is recommended).  . To help with nausea and vomiting, eat small, frequent meals instead of three large meals a day. Choose foods and drinks that are at room temperature. Ask your nurse or doctor about other helpful tips and medicine that is available to help stop or lessen these symptoms.  . If you throw up, you should drink more fluids so that you do not become dehydrated (lack of water in the body from losing too much fluid).  . Manage tiredness by pacing your activities for the day.  . Be sure to include periods of rest between energy-draining activities.  . To help with muscle weakness, get regular exercise. If you feel too tired to exercise vigorously, try taking a short walk.  . If you are having trouble sleeping, talk to your nurse or doctor on tips to help you sleep better.  . If you are feeling depressed, talk to your nurse or doctor about it.  Marland Kitchen Keeping your pain under control is important to your well-being. Please tell your doctor or nurse if you are experiencing pain.  . If you have diabetes, keep good control of your blood sugar level. Tell your nurse or your doctor if your glucose levels are higher or lower than normal.  . To decrease the risk of infection, wash your hands regularly.  . Avoid close contact with people who have a cold, the flu, or other infections.  . Take your temperature as your doctor or nurse tells you, and whenever you feel like you may have a fever.  . If you get a rash do not put anything on it unless your doctor or nurse says you may. Keep the area around the rash clean and dry. Ask your doctor for medicine if your rash bothers you.  . Moisturize your skin several times day.  . Avoid sun exposure and apply sunscreen routinely when  outdoors.  Food and Drug Interactions  . There are no known interactions of dexamethasone with food.  . Check with your doctor or pharmacist about all other prescription medicines and over-the-counter medicines and dietary supplements (vitamins, minerals, herbs and others) you are taking before starting this medicine as there are known drug interactions with dexamethasone. Also, check with your doctor or pharmacist before starting any new prescription or over-the-counter medicines, or dietary supplement to make sure that there are no interactions.  . There are known interactions of dexamethasone with other medicines and products like acetaminophen, aspirin, and ibuprofen. Ask your doctor what over-the-counter (OTC) medicines you can take.  When to Call the Doctor  Call your doctor or nurse if you have any of these symptoms and/or any new or unusual symptoms:  . Fever of 100.4 F (38 C) or higher  . Chills  . A headache that does not go away  . Trouble breathing  . Blurry vision or other changes in eyesight  . Feel irritable, nervous or restless  . Trouble falling or staying asleep  . Severe mood changes such as depression or unusual thoughts and/or behaviors  . Thoughts of hurting yourself or others, and suicide  . Tiredness that interferes with your daily activities  . Feeling that your heart is beating in a fast, slow or not normal way  . Feeling dizzy or lightheaded  . Chest pain or symptoms  of a heart attack. Most heart attacks involve pain in the center of the chest that lasts more than a few minutes. The pain may go away and come back, or it can be constant. It can feel like pressure, squeezing, fullness, or pain. Sometimes pain is felt in one or both arms, the back, neck, jaw, or stomach. If any of these symptoms last 2 minutes, call 911.  Marland Kitchen Heartburn or indigestion  . Nausea that stops you from eating or drinking and/or is not relieved by prescribed  medicines  . Throwing up more than 3 times a day  . Pain in your abdomen that does not go away  . Abnormal blood sugar  . Unusual thirst, passing urine often, headache, sweating, shakiness, irritability  . Swelling of legs, ankles, or feet  . Weight gain of 5 pounds in one week (fluid retention)  . Signs of possible liver problems: dark urine, pale bowel movements, bad stomach pain, feeling very tired and weak, unusual itching, or yellowing of the eyes or skin  . Severe muscle weakness  . A new rash or a rash that is not relieved by prescribed medicines  . Signs of allergic reaction: swelling of the face, feeling like your tongue or throat are swelling, trouble breathing, rash, itching, fever, chills, feeling dizzy, and/or feeling that your heart is beating in a fast or not normal way. If this happens, call 911 for emergency care.  . If you think you may be pregnant  Reproduction Warnings  . Pregnancy warning: It is not known if this drug may harm an unborn child. For this reason, be sure to talk with your doctor if you are pregnant or planning to become pregnant while receiving this drug. Let your doctor know right away if you think you may be pregnant or may have impregnated your partner.  . Breastfeeding warning: It is not known if this drug passes into breast milk. For this reason, women should talk to their doctor about the risks and benefits of breastfeeding during treatment with this drug because this drug may enter the breast milk and cause harm to a breastfeeding baby.  . Fertility warning: Human fertility studies have not been done with this drug. Talk with your doctor or nurse if you plan to have children. Ask for information on sperm banking.   Cyclophosphamide (Generic Name) Other Names: Cytoxan, Neosar  About This Drug Cyclophosphamide is a drug used to treat cancer. It is given in the vein (IV) or by mouth.  Takes 30 minutes for this drug to  infuse.  Possible Side Effects (More Common) . Nausea and throwing up (vomiting). These symptoms may happen within a few hours after your treatment and may last up to 72 hours. Medicines are available to stop or lessen these side effects. . Bone marrow depression. This is a decrease in the number of white blood cells, red blood cells, and platelets. This may raise your risk of infection, make you tired and weak (fatigue), and raise your risk of bleeding. . Hair loss: You may notice hair getting thin. Some patients lose their hair. Hair loss is often complete scalp hair loss and can involve loss of eyebrows, eyelashes, and pubic hair. You may notice this a few days or weeks after treatment has started. Most often hair loss is temporary; your hair should grow back when treatment is done. . Decreased appetite (decreased hunger) . Blurred vision . Soreness of the mouth and throat. You may have red areas, white  patches, or sores that hurt. . Effects on the bladder. This drug may cause irritation and bleeding in the bladder. You may have blood in your urine. To help stop this, you will get extra fluids to help you pass more urine. You may get a drug called mesna, which helps to decrease irritation and bleeding. You may also get a medicine to help you pass more urine. You may have a catheter (tube) placed in your bladder so that your bladder will be washed with this drug.  Possible Side Effects (Less Common) . Darkening of the skin or nails . Metallic taste in the mouth . Changes in lung tissue may happen with large amounts of this drug. These changes may not last forever, and your lung tissue may go back to normal. Sometimes these changes may not be seen for many years. You may get a cough or have trouble catching your breath.  Allergic Reactions   Serious allergic reactions including anaphylaxis are rare. While you are getting this drug in your vein (IV), tell your nurse right away if you have any of  these symptoms of an allergic reaction: . Trouble catching your breath . Feeling like your tongue or throat are swelling . Feeling your heart beat quickly or in a not normal way (palpitations) . Feeling dizzy or lightheaded . Flushing, itching, rash, and/or hives  Treating Side Effects . Drink 6-8 cups of fluids each day unless your doctor has told you to limit your fluid intake due to some other health problem. A cup is 8 ounces of fluid. If you throw up or have loose bowel movements you should drink more fluids so that you do not become dehydrated (lack water in the body due to losing too much fluid). . Ask your doctor or nurse about medicine that is available to help stop or lessen nausea or throwing up. . Mouth care is very important. Your mouth care should consist of routine, gentle cleaning of your teeth or dentures and rinsing your mouth with a mixture of 1/2 teaspoon of salt in 8 ounces of water or  teaspoon of baking soda in 8 ounces of water. This should be done at least after each meal and at bedtime. . If you have mouth sores, avoid mouthwash that has alcohol. Also avoid alcohol and smoking because they can bother your mouth and throat. . Talk with your nurse about getting a wig before you lose your hair. Also, call the [facility] at 800-ACS-2345 to find out information about the " Look Good.Marland KitchenMarland KitchenFeel Better" program close to where you live. It is a free program where women undergoing chemotherapy learn about wigs, turbans and scarves as well as makeup techniques and skin and nail care.  Important Information . Whenever you tell a doctor or nurse your health history, always tell them that you have received cyclophosphamide in the past. . If you take this drug by mouth swallow the medicine whole. Do not chew, break or crush it. . You can take the medicine with or without food. If you have nausea, take it with food. Do not take the pills at bedtime.  Food and Drug  Interactions There are no known interactions of cyclophosphamide with food. This drug may interact with other medicines. Tell your doctor and pharmacist about all the medicines and dietary supplements (vitamins, minerals, herbs and others) that you are taking at this time. The safety and use of dietary supplements and alternative diets are often not known. Using these might  affect your cancer or interfere with your treatment. Until more is known, you should not use dietary supplements or alternative diets without your cancer doctor's help.  When to Call the Doctor Call your doctor or nurse right away if you have any of these symptoms: . Fever of 100.5 F (38 C) or higher . Chills . Bleeding or bruising that is not normal . Blurred vision or other changes in eyesight . Pain when passing urine; blood in urine . Pain in your lower back or side . Wheezing or trouble breathing . Swelling of legs, ankles, or feet . Feeling dizzy or lightheaded . Feeling confused or agitated . Signs of liver problems: dark urine, pale bowel movements, bad stomach pain, feeling very tired and weak, unusual itching, or yellowing of the eyes or skin . Unusual thirst or passing urine often . Nausea that stops you from eating or drinking . Throwing up  Call your doctor or nurse as soon as possible if any of these symptoms happen: . Pain in your mouth or throat that makes it hard to eat or drink . Nausea not relieved by prescribed medicines  Sexual Problems and Reproductive Concerns . Infertility warning: Sexual problems and reproduction concerns may happen. In both men and women, this drug may affect your ability to have children. This cannot be determined before your treatment. Talk with your doctor or nurse if you plan to have children. Ask for information on sperm or egg banking. . In men, this drug may interfere with your ability to make sperm, but it should not change your ability to have sexual relations. . In  women, menstrual bleeding may become irregular or stop while you are getting this drug. Do not assume that you cannot become pregnant if you do not have a menstrual period. . Women may go through signs of menopause (change of life) like vaginal dryness or itching. Vaginal lubricants can be used to lessen vaginal dryness, itching, and pain during sexual relations. . Genetic counseling is available for you to talk about the effects of this drug therapy on future pregnancies. Also, a genetic counselor can look at the possible risk of problems in the unborn baby due to this medicine if an exposure happens during pregnancy. . Pregnancy warning: This drug may have harmful effects on the unborn child, so effective methods of birth control should be used during your cancer treatment. . Breast feeding warning: Women should not breast feed during treatment because this drug could enter the breast milk and badly harm a breast feeding baby  [place]:  Hydration Increase your fluid intake 48 hours prior to treatment and drink at least 8 to 12 cups (64 ounces) of water/decaffeinated beverages per day after treatment. You can still have your cup of coffee or soda but these beverages do not count as part of your 8 to 12 cups that you need to drink daily. No alcohol intake.  Medications Continue taking your normal prescription medication as prescribed.  If you start any new herbal or new supplements please let us know first to make sure it is safe.  Mouth Care Have teeth cleaned professionally before starting treatment. Keep dentures and partial plates clean. Use soft toothbrush and do not use mouthwashes that contain alcohol. Biotene is a good mouthwash that is available at most pharmacies or may be ordered by calling [phone]. Use warm salt water gargles (1 teaspoon salt per 1 quart warm water) before and after meals and  at bedtime. If you need dental work, please let  the doctor know before you go for your appointment so that we can coordinate the best possible time for you in regards to your chemo regimen. You need to also let your dentist know that you are actively taking chemo. We may need to do labs prior to your dental appointment.  Skin Care Always use sunscreen that has not expired and with SPF (Sun Protection Factor) of 50 or higher. Wear hats to protect your head from the sun. Remember to use sunscreen on your hands, ears, face, & feet.  Use good moisturizing lotions such as udder cream, eucerin, or even Vaseline. Some chemotherapies can cause dry skin, color changes in your skin and nails.    . Avoid long, hot showers or baths. . Use gentle, fragrance-free soaps and laundry detergent. . Use moisturizers, preferably creams or ointments rather than lotions because the thicker consistency is better at preventing skin dehydration. Apply the cream or ointment within 15 minutes of showering. Reapply moisturizer at night, and moisturize your hands every time after you wash them.  Hair Loss (if your doctor says your hair will fall out)  . If your doctor says that your hair is likely to fall out, decide before you begin chemo whether you want to wear a wig. You may want to shop before treatment to match your hair color. . Hats, turbans, and scarves can also camouflage hair loss, although some people prefer to leave their heads uncovered. If you go bare-headed outdoors, be sure to use sunscreen on your scalp. . Cut your hair short. It eases the inconvenience of shedding lots of hair, but it also can reduce the emotional impact of watching your hair fall out. . Don't perm or color your hair during chemotherapy. Those chemical treatments are already damaging to hair and can enhance hair loss. Once your chemo treatments are done and your hair has grown back, it's OK to resume dyeing or perming hair.  With chemotherapy, hair loss is almost always temporary. But when  it grows back, it may be a different color or texture. In older adults who still had hair color before chemotherapy, the new growth may be completely gray.  Often, new hair is very fine and soft.  Infection Prevention Please wash your hands for at least 30 seconds using warm soapy water. Handwashing is the #1 way to prevent the spread of germs. Stay away from sick people or people who are getting over a cold. If you develop respiratory systems such as green/yellow mucus production or productive cough or persistent cough let us know and we will see if you need an antibiotic. It is a good idea to keep a pair of gloves on when going into grocery stores/[employer] to decrease your risk of coming into contact with germs on the carts, etc. Carry alcohol hand gel with you at all times and use it frequently if out in public. If your temperature reaches 100.5 or higher please call the clinic and let us know.  If it is after hours or on the weekend please go to the ER if your temperature is over 100.5.  Please have your own personal thermometer at home to use.    Sex and bodily fluids If you are going to have sex, a condom must be used to protect the person that isn't taking chemotherapy. Chemo can decrease your libido (sex drive). For a few days after chemotherapy, chemotherapy can be excreted through your bodily  fluids.  When using the toilet please close the lid and flush the toilet twice.  Do this for a few day after you have had chemotherapy.   Effects of chemotherapy on your sex life Some changes are simple and won't last long. They won't affect your sex life permanently.  Sometimes you may feel: . too tired . not strong enough to be very active . sick or sore  . not in the mood . anxious or low Your anxiety might not seem related to sex. For example, you may be worried about the cancer and how your treatment is going. Or you may be worried about money, or about how you family are coping with your  illness. These things can cause stress, which can affect your interest in sex. It's important to talk to your partner about how you feel. Remember - the changes to your sex life don't usually last long. There's usually no medical reason to stop having sex during chemo. The drugs won't have any long term physical effects on your performance or enjoyment of sex. Cancer can't be passed on to your partner during sex  Contraception It's important to use reliable contraception during treatment. Avoid getting pregnant while you or your partner are having chemotherapy. This is because the drugs may harm the baby. Sometimes chemotherapy drugs can leave a man or woman infertile.  This means you would not be able to have children in the future. You might want to talk to someone about permanent infertility. It can be very difficult to learn that you may no longer be able to have children. Some people find counselling helpful. There might be ways to preserve your fertility, although this is easier for men than for women. You may want to speak to a fertility expert. You can talk about sperm banking or harvesting your eggs. You can also ask about other fertility options, such as donor eggs. If you have or have had breast cancer, your doctor might advise you not to take the contraceptive pill. This is because the hormones in it might affect the cancer.  It is not known for sure whether or not chemotherapy drugs can be passed on through semen or secretions from the vagina. Because of this some doctors advise people to use a barrier method if you have sex during treatment. This applies to vaginal, anal or oral sex. Generally, doctors advise a barrier method only for the time you are actually having the treatment and for about a week after your treatment. Advice like this can be worrying, but this does not mean that you have to avoid being intimate with your partner. You can still have close contact with your partner and  continue to enjoy sex.  Animals If you have cats or birds we just ask that you not change the litter or change the cage.  Please have someone else do this for you while you are on chemotherapy.   Food Safety During and After Cancer Treatment Food safety is important for people both during and after cancer treatment. Cancer and cancer treatments, such as chemotherapy, radiation therapy, and stem cell/bone marrow transplantation, often weaken the immune system. This makes it harder for your body to protect itself from foodborne illness, also called food poisoning. Foodborne illness is caused by eating food that contains harmful bacteria, parasites, or viruses.  Foods to avoid Some foods have a higher risk of becoming tainted with bacteria. These include: Marland Kitchen Unwashed fresh fruit and vegetables, especially leafy vegetables that  can hide dirt and other contaminants . Raw sprouts, such as alfalfa sprouts . Raw or undercooked beef, especially ground beef, or other raw or undercooked meat and poultry . Fatty, fried, or spicy foods immediately before or after treatment.  These can sit heavy on your stomach and make you feel nauseous. . Raw or undercooked shellfish, such as oysters. . Sushi and sashimi, which often contain raw fish.  . Unpasteurized beverages, such as unpasteurized fruit juices, raw milk, raw yogurt, or cider . Undercooked eggs, such as soft boiled, over easy, and poached; raw, unpasteurized eggs; or foods made with raw egg, such as homemade raw cookie dough and homemade mayonnaise  Simple steps for food safety  Shop smart. . Do not buy food stored or displayed in an unclean area. . Do not buy bruised or damaged fruits or vegetables. . Do not buy cans that have cracks, dents, or bulges. . Pick up foods that can spoil at the end of your shopping trip and store them in a cooler on the way home.  Prepare and clean up foods carefully. . Rinse all fresh fruits and vegetables under  running water, and dry them with a clean towel or paper towel. . Clean the top of cans before opening them. . After preparing food, wash your hands for 20 seconds with hot water and soap. Pay special attention to areas between fingers and under nails. . Clean your utensils and dishes with hot water and soap. Marland Kitchen Disinfect your kitchen and cutting boards using 1 teaspoon of liquid, unscented bleach mixed into 1 quart of water.    Dispose of old food. . Eat canned and packaged food before its expiration date (the "use by" or "best before" date). . Consume refrigerated leftovers within 3 to 4 days. After that time, throw out the food. Even if the food does not smell or look spoiled, it still may be unsafe. Some bacteria, such as Listeria, can grow even on foods stored in the refrigerator if they are kept for too long.  Take precautions when eating out. . At restaurants, avoid buffets and salad bars where food sits out for a long time and comes in contact with many people. Food can become contaminated when someone with a virus, often a norovirus, or another "bug" handles it. . Put any leftover food in a "to-go" container yourself, rather than having the server do it. And, refrigerate leftovers as soon as you get home. . Choose restaurants that are clean and that are willing to prepare your food as you order it cooked.   AT HOME MEDICATIONS:  Compazine/Prochlorperazine 71m tablet. Take 1 tablet every 6 hours as needed for nausea/vomiting. (This can make you sleepy)   EMLA cream. Apply a quarter size amount to port site 1 hour prior to chemo. Do not rub in. Cover with plastic wrap.    Diarrhea Sheet   If you are having loose stools/diarrhea, please purchase Imodium and begin taking as outlined:  At the first sign of poorly formed or loose  stools you should begin taking Imodium (loperamide) 2 mg capsules.  Take two tablets (477m followed by one tablet (20m15mevery 2 hours - DO NOT EXCEED 8 tablets in 24 hours.  If it is bedtime and you are having loose stools, take 2 tablets at bedtime, then 2 tablets every 4 hours until morning.   Always call the [facility] you are having loose stools/diarrhea that you can't get under control.  Loose stools/diarrhea leads to dehydration (loss of water) in your body.  We have other options of trying to get the loose stools/diarrhea to stop but you must let us [place]!   Constipation Sheet  Colace - 100 mg capsules - take 2 capsules daily.  If this doesn't help then you can increase to 2 capsules twice daily.  Please call if the above does not work for you. Do not go more than 2 days without a bowel movement.  It is very important that you do not become constipated.  It will make you feel sick to your stomach (nausea) and can cause abdominal pain and vomiting.  Nausea Sheet   Compazine/Prochlorperazine 64m54mblet. Take 1 tablet every 6 hours as needed for nausea/vomiting (This can make you drowsy).  If you are having persistent nausea (nausea that does not stop) please call the [facility] let us [place] the amount of nausea that you are experiencing.  If you begin to vomit, you need to call the [facility] if it is the weekend and you have vomited more than one time and can't get it to stop-go to the Emergency Room.  Persistent nausea/vomiting can lead to dehydration (loss of fluid in your body) and will make you feel very weak and unwell. Ice chips, sips of clear liquids, foods that are at room temperature, crackers, and toast tend to be better tolerated.    SYMPTOMS TO REPORT AS SOON AS POSSIBLE AFTER TREATMENT:  FEVER GREATER THAN 100.5 F  CHILLS WITH OR WITHOUT FEVER  NAUSEA AND VOMITING THAT IS NOT CONTROLLED WITH YOUR NAUSEA MEDICATION  UNUSUAL SHORTNESS OF BREATH  UNUSUAL  BRUISING OR BLEEDING  TENDERNESS IN MOUTH AND THROAT WITH OR WITHOUT PRESENCE OF ULCERS  URINARY PROBLEMS  BOWEL PROBLEMS  UNUSUAL RASH    Wear comfortable clothing and clothing appropriate for easy access to any Portacath or PICC line. Let us [place] if there is anything that we can do to make your therapy better!  What to do if you need assistance after hours or on the weekends: CALL [phone] on the line, do not hang up.  You will hear multiple messages but at the end you will be connected with a nurse triage line.  They will contact the doctor if necessary.  Most of the time they will be able to assist you.  Do not call the hospital operator.     I have been informed and understand all of the instructions given to me and have received a copy. I have been instructed to call the clinic [phone]  family physician as soon as possible for continued medical care, if indicated. I do not have any more questions at this time but understand that I may call the [facility] or the Patient Navigator at [phone] during office hours should I have questions or need assistance in obtaining follow-up care.

## 2019-10-21 ENCOUNTER — Encounter (HOSPITAL_COMMUNITY): Payer: Self-pay

## 2019-10-21 ENCOUNTER — Inpatient Hospital Stay (HOSPITAL_COMMUNITY): Payer: Medicare Other

## 2019-10-21 ENCOUNTER — Other Ambulatory Visit: Payer: Self-pay

## 2019-10-21 ENCOUNTER — Encounter (HOSPITAL_COMMUNITY): Payer: Self-pay | Admitting: Hematology

## 2019-10-21 ENCOUNTER — Ambulatory Visit (HOSPITAL_COMMUNITY): Payer: Medicare Other

## 2019-10-21 VITALS — BP 113/53 | HR 88 | Temp 97.9°F | Resp 18 | Wt 191.2 lb

## 2019-10-21 DIAGNOSIS — C9000 Multiple myeloma not having achieved remission: Secondary | ICD-10-CM

## 2019-10-21 DIAGNOSIS — Z5112 Encounter for antineoplastic immunotherapy: Secondary | ICD-10-CM | POA: Diagnosis not present

## 2019-10-21 MED ORDER — DEXAMETHASONE 4 MG PO TABS
40.0000 mg | ORAL_TABLET | Freq: Once | ORAL | Status: AC
  Administered 2019-10-21: 40 mg via ORAL
  Filled 2019-10-21: qty 10

## 2019-10-21 MED ORDER — SODIUM CHLORIDE 0.9 % IV SOLN
150.0000 mg/m2 | Freq: Once | INTRAVENOUS | Status: AC
  Administered 2019-10-21: 12:00:00 300 mg via INTRAVENOUS
  Filled 2019-10-21: qty 15

## 2019-10-21 MED ORDER — SODIUM CHLORIDE 0.9% FLUSH
10.0000 mL | INTRAVENOUS | Status: DC | PRN
  Administered 2019-10-21: 10 mL
  Filled 2019-10-21: qty 10

## 2019-10-21 MED ORDER — SODIUM CHLORIDE 0.9 % IV SOLN
Freq: Once | INTRAVENOUS | Status: AC
  Administered 2019-10-21: 11:00:00 via INTRAVENOUS

## 2019-10-21 MED ORDER — BORTEZOMIB CHEMO SQ INJECTION 3.5 MG (2.5MG/ML)
1.5000 mg/m2 | Freq: Once | INTRAMUSCULAR | Status: AC
  Administered 2019-10-21: 3 mg via SUBCUTANEOUS
  Filled 2019-10-21: qty 1.2

## 2019-10-21 MED ORDER — HEPARIN SOD (PORK) LOCK FLUSH 100 UNIT/ML IV SOLN: 500.0000 [IU] | Freq: Once | INTRAVENOUS | Status: DC | PRN

## 2019-10-21 MED ORDER — PALONOSETRON HCL INJECTION 0.25 MG/5ML
0.2500 mg | Freq: Once | INTRAVENOUS | Status: AC
  Administered 2019-10-21: 0.25 mg via INTRAVENOUS
  Filled 2019-10-21: qty 5

## 2019-10-21 NOTE — Progress Notes (Signed)
Patient to treatment room for chemotherapy.  Teaching completed.  Family at side.  No s/s of distress noted.    Patient tolerated treatment with no complaints voiced.  Peripheral IV site clean and dry with no bruising or swelling noted at site.  Good blood return noted before and after chemotherapy.  Patient denied pain at site.  VSs with discharge and left ambulatory with no s/s of distress noted.

## 2019-10-21 NOTE — Progress Notes (Signed)
10/21/19  Creatinine 4.57 - ok to treat dose has been reduced to reflect chronic kidney failure  T.O.  Dr Rhys Martini, PharmD

## 2019-10-27 ENCOUNTER — Ambulatory Visit: Payer: Medicare Other | Admitting: General Surgery

## 2019-10-28 ENCOUNTER — Encounter (HOSPITAL_COMMUNITY): Payer: Self-pay | Admitting: Hematology

## 2019-10-28 ENCOUNTER — Telehealth (HOSPITAL_COMMUNITY): Payer: Self-pay

## 2019-10-28 ENCOUNTER — Inpatient Hospital Stay (HOSPITAL_COMMUNITY): Payer: Medicare Other | Attending: Hematology | Admitting: Hematology

## 2019-10-28 ENCOUNTER — Other Ambulatory Visit: Payer: Self-pay

## 2019-10-28 ENCOUNTER — Inpatient Hospital Stay (HOSPITAL_COMMUNITY): Payer: Medicare Other

## 2019-10-28 VITALS — BP 101/55 | HR 95 | Temp 98.2°F | Resp 18

## 2019-10-28 DIAGNOSIS — Z79899 Other long term (current) drug therapy: Secondary | ICD-10-CM | POA: Diagnosis not present

## 2019-10-28 DIAGNOSIS — R7989 Other specified abnormal findings of blood chemistry: Secondary | ICD-10-CM | POA: Insufficient documentation

## 2019-10-28 DIAGNOSIS — I517 Cardiomegaly: Secondary | ICD-10-CM | POA: Diagnosis not present

## 2019-10-28 DIAGNOSIS — C9000 Multiple myeloma not having achieved remission: Secondary | ICD-10-CM

## 2019-10-28 DIAGNOSIS — R778 Other specified abnormalities of plasma proteins: Secondary | ICD-10-CM | POA: Insufficient documentation

## 2019-10-28 DIAGNOSIS — Z5112 Encounter for antineoplastic immunotherapy: Secondary | ICD-10-CM | POA: Insufficient documentation

## 2019-10-28 DIAGNOSIS — M7989 Other specified soft tissue disorders: Secondary | ICD-10-CM | POA: Diagnosis not present

## 2019-10-28 DIAGNOSIS — R803 Bence Jones proteinuria: Secondary | ICD-10-CM | POA: Diagnosis not present

## 2019-10-28 DIAGNOSIS — E876 Hypokalemia: Secondary | ICD-10-CM

## 2019-10-28 LAB — CBC WITH DIFFERENTIAL/PLATELET
Abs Immature Granulocytes: 0.01 K/uL (ref 0.00–0.07)
Basophils Absolute: 0.1 K/uL (ref 0.0–0.1)
Basophils Relative: 1 %
Eosinophils Absolute: 0 K/uL (ref 0.0–0.5)
Eosinophils Relative: 1 %
HCT: 34.9 % — ABNORMAL LOW (ref 36.0–46.0)
Hemoglobin: 11.3 g/dL — ABNORMAL LOW (ref 12.0–15.0)
Immature Granulocytes: 0 %
Lymphocytes Relative: 21 %
Lymphs Abs: 1.6 K/uL (ref 0.7–4.0)
MCH: 33.5 pg (ref 26.0–34.0)
MCHC: 32.4 g/dL (ref 30.0–36.0)
MCV: 103.6 fL — ABNORMAL HIGH (ref 80.0–100.0)
Monocytes Absolute: 1.3 K/uL — ABNORMAL HIGH (ref 0.1–1.0)
Monocytes Relative: 17 %
Neutro Abs: 4.7 K/uL (ref 1.7–7.7)
Neutrophils Relative %: 60 %
Platelets: 211 K/uL (ref 150–400)
RBC: 3.37 MIL/uL — ABNORMAL LOW (ref 3.87–5.11)
RDW: 14.5 % (ref 11.5–15.5)
WBC: 7.7 K/uL (ref 4.0–10.5)
nRBC: 0 % (ref 0.0–0.2)

## 2019-10-28 LAB — COMPREHENSIVE METABOLIC PANEL
ALT: 20 U/L (ref 0–44)
AST: 53 U/L — ABNORMAL HIGH (ref 15–41)
Albumin: 2.1 g/dL — ABNORMAL LOW (ref 3.5–5.0)
Alkaline Phosphatase: 158 U/L — ABNORMAL HIGH (ref 38–126)
Anion gap: 10 (ref 5–15)
BUN: 15 mg/dL (ref 8–23)
CO2: 28 mmol/L (ref 22–32)
Calcium: 7.5 mg/dL — ABNORMAL LOW (ref 8.9–10.3)
Chloride: 99 mmol/L (ref 98–111)
Creatinine, Ser: 3.83 mg/dL — ABNORMAL HIGH (ref 0.44–1.00)
GFR calc Af Amer: 12 mL/min — ABNORMAL LOW (ref >60)
GFR calc non Af Amer: 11 mL/min — ABNORMAL LOW (ref >60)
Glucose, Bld: 104 mg/dL — ABNORMAL HIGH (ref 70–99)
Potassium: 3 mmol/L — ABNORMAL LOW (ref 3.5–5.1)
Sodium: 137 mmol/L (ref 135–145)
Total Bilirubin: 0.3 mg/dL (ref 0.3–1.2)
Total Protein: 4.8 g/dL — ABNORMAL LOW (ref 6.5–8.1)

## 2019-10-28 LAB — BRAIN NATRIURETIC PEPTIDE: B Natriuretic Peptide: 1086 pg/mL — ABNORMAL HIGH (ref 0.0–100.0)

## 2019-10-28 MED ORDER — BORTEZOMIB CHEMO SQ INJECTION 3.5 MG (2.5MG/ML)
1.5000 mg/m2 | Freq: Once | INTRAMUSCULAR | Status: AC
  Administered 2019-10-28: 3 mg via SUBCUTANEOUS
  Filled 2019-10-28: qty 1.2

## 2019-10-28 MED ORDER — DEXAMETHASONE 4 MG PO TABS
40.0000 mg | ORAL_TABLET | Freq: Once | ORAL | Status: AC
  Administered 2019-10-28: 40 mg via ORAL
  Filled 2019-10-28: qty 10

## 2019-10-28 MED ORDER — POTASSIUM CHLORIDE CRYS ER 10 MEQ PO TBCR
40.0000 meq | EXTENDED_RELEASE_TABLET | Freq: Once | ORAL | Status: AC
  Administered 2019-10-28: 40 meq via ORAL
  Filled 2019-10-28: qty 4

## 2019-10-28 MED ORDER — PALONOSETRON HCL INJECTION 0.25 MG/5ML
0.2500 mg | Freq: Once | INTRAVENOUS | Status: AC
  Administered 2019-10-28: 0.25 mg via INTRAVENOUS
  Filled 2019-10-28: qty 5

## 2019-10-28 MED ORDER — SODIUM CHLORIDE 0.9 % IV SOLN
Freq: Once | INTRAVENOUS | Status: AC
  Administered 2019-10-28: 10:00:00 via INTRAVENOUS

## 2019-10-28 MED ORDER — SODIUM CHLORIDE 0.9 % IV SOLN
150.0000 mg/m2 | Freq: Once | INTRAVENOUS | Status: AC
  Administered 2019-10-28: 300 mg via INTRAVENOUS
  Filled 2019-10-28: qty 15

## 2019-10-28 NOTE — Progress Notes (Signed)
10/28/19  Potassium level - 3.0 - order received to give Potassium chloride 40 meq orally x 1  Patient did not take dexamethasone at home will order for nursing to give today.  Dexamethasone 40 mg orally x 1 today.  Ok to give with today's labs, keep Cytoxan at 50% dose reduction.  Orders entered to reflect above.  T.OReynolds Bowl, NP-C/Malala Trenkamp Ronnald Ramp, PharmD

## 2019-10-28 NOTE — Assessment & Plan Note (Addendum)
1. Lambda Light Chain Disease: -Presented to the emergency room 09/21/2019 with acute on chronic renal failure. -Multiple myeloma labs completed on 09/22/2019 with negative SPEP, kappa free light chain 74.6, lambda free light chains 1062, ratio 0.07.  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 -Bone marrow biopsy completed on 09/30/2019: Hypercellular marrow with 14% plasma cell, weak lambda restriction. FISH: No high risk gene mutations.  [place]ongo red staining positive for small foci of amyloid deposits. -Skeletal survey negative for lytic lesions. -Today, we discussed these findings in detail.  There is concern for multiple myeloma but also amyloidosis.  We will need to do a thorough work-up for possible amyloidosis including fat pad biopsy and a cardiac MRI. -Of note echocardiogram was completed on 09/22/2019 which showed an ejection fraction of 65 to 70% with severely increased left ventricular hypertrophy.  This could be due to uncontrolled hypertension or possible amyloid disease. -Per NCCN guidelines, PET/ CT scan has also been recommended for further work-up of lytic lesions. -We will also complete lab work-up with immunofixation electrophoresis, beta-2 microglobulin, troponin I and T, and pro BNP. -In order not to delay treatment, I have recommended to proceed with weekly  Cytoxan, Velcade, dexamethasone.  As this will not only treat multiple myeloma but also amyloidosis.  We discussed common side effects of drugs recommended.  She will be prescribed acyclovir for VZV prophylaxis. -Patient received cycle one CyBorD on 10/21/2019.  Tolerated exceptionally well. -Further diagnostic testing was completed.  PET/CT was performed at [facility] in [place] on 10/19/2019 and was negative for any lytic lesions or FDG activity. -Fat pad biopsy was completed by Dr. Herschell Dimes. [place] Red staining did not reveal any evidence of amyloid. -Cardiac labs were significant for elevated  troponin 0 0.308 ng/mL; BNP elevated at 1086.0 pg/mL -Cardiac MRI order is still pending. -Immunofixation electrophoresis revealed the presence of monoclonal free lambda light chain. -CBC today revealed stable hemoglobin at 11.3; serum creatinine has improved after cycle 1, Day 1 of CyBorD now measures 3.83 previously measured 4.57. -Recommend patient continue with CyBorD  cycle 1 day 8 today.  She will return to clinic in 1 week.

## 2019-10-28 NOTE — Progress Notes (Signed)
[place], [place] [address]   CLINIC:  Medical Oncology/Hematology  PCP:  Frances Maywood, [address] [place] [phone]   REASON FOR VISIT:  Follow-up for Multiple Myeloma   CURRENT THERAPY: CyBorD  BRIEF ONCOLOGIC HISTORY:  Oncology History  Multiple myeloma ([facility])  09/30/2019 Initial Diagnosis   Multiple myeloma ([place])   10/21/2019 -  Chemotherapy   The patient had palonosetron (ALOXI) injection 0.25 mg, 0.25 mg, Intravenous,  Once, 1 of 4 cycles Administration: 0.25 mg (10/21/2019) bortezomib SQ (VELCADE) chemo injection 3 mg, 1.5 mg/m2 = 3 mg, Subcutaneous,  Once, 1 of 4 cycles Administration: 3 mg (10/21/2019) cyclophosphamide (CYTOXAN) 300 mg in sodium chloride 0.9 % 250 mL chemo infusion, 150 mg/m2 = 300 mg (50 % of original dose 300 mg/m2), Intravenous,  Once, 1 of 4 cycles Dose modification: 150 mg/m2 (50 % of original dose 300 mg/m2, Cycle 1, Reason: Other (see comments), Comment: Hemodialysis, will increase to 272m/m2 weekly) Administration: 300 mg (10/21/2019)  for chemotherapy treatment.         INTERVAL HISTORY:  Ms. [id number]. female presents today for follow-up.  Her daughter accompanies her.  She reports overall doing well.  She is status post 1 cycle of CyBorD.  She tolerated cycle one exceptionally well.  Denies any significant fatigue.  Denies any peripheral neuropathies.  Denies any nausea, vomiting, diarrhea.  Bowel habits are within normal.  Appetite is stable.  She presents today to discuss recent test and cycle two.  She states she is ready to proceed with treatment today.   REVIEW OF SYSTEMS:  Review of Systems  Constitutional: Positive for fatigue.  HENT:  Negative.   Eyes: Negative.   Respiratory: Negative.   Cardiovascular: Negative.   Gastrointestinal: Negative.   Endocrine: Negative.   Genitourinary: Negative.    Musculoskeletal: Positive for arthralgias, gait  problem and myalgias.  Skin: Negative.   Neurological: Positive for extremity weakness and gait problem.  Hematological: Negative.   Psychiatric/Behavioral: Negative.      PAST MEDICAL/SURGICAL HISTORY:  Past Medical History:  Diagnosis Date  . CHF (congestive heart failure) ([facility]   . Coronary artery disease   . Dyspnea   . High cholesterol   . Hypertension   . Hypothyroidism   . Renal disorder   . Seizures ([facility]   . Sleep apnea    Past Surgical History:  Procedure Laterality Date  . ABDOMINAL HYSTERECTOMY    . AV FISTULA PLACEMENT Left 10/07/2019   Procedure: ARTERIOVENOUS (AV)  BRACHIOCEPHALIC FISTULA CREATION;  Surgeon: CMarty Heck MD;  [facility]  Service: Vascular;  Laterality: Left;  . BRAIN SURGERY     benign tumor removal  . EXCHANGE OF A DIALYSIS CATHETER Right 10/07/2019   Procedure: EXCHANGE OF A DIALYSIS CATHETER;  Surgeon: CMarty Heck MD;  [facility]  Service: Vascular;  Laterality: Right;     SOCIAL HISTORY:  Social History   Socioeconomic History  . Marital status: Widowed    Spouse name: Not on file  . Number of children: 4  . Years of education: Not on file  . Highest education level: Not on file  Occupational History  . Occupation: reitred  Social Needs  . Financial resource strain: Not hard at all  . Food insecurity    Worry: Never true    Inability: Never true  . Transportation needs    Medical: No    Non-medical:  No  Tobacco Use  . Smoking status: Never Smoker  . Smokeless tobacco: Never Used  Substance and Sexual Activity  . Alcohol use: Never    Frequency: Never  . Drug use: Never  . Sexual activity: Not Currently  Lifestyle  . Physical activity    Days per week: 7 days    Minutes per session: 30 min  . Stress: Only a little  Relationships  . Social connections    Talks on phone: More than three times a week    Gets together: More than three times a week    Attends religious service: More than  4 times per year    Active member of club or organization: Yes    Attends meetings of clubs or organizations: More than 4 times per year    Relationship status: Widowed  . Intimate partner violence    Fear of current or ex partner: No    Emotionally abused: No    Physically abused: No    Forced sexual activity: No  Other Topics Concern  . Not on file  Social History Narrative  . Not on file    FAMILY HISTORY:  Family History  Problem Relation Age of Onset  . Cancer Mother   . Cancer Sister   . Hypertension Father   . Cancer Brother   . Alcohol abuse Brother   . Hypertension Son   . Heart attack Son     CURRENT MEDICATIONS:  Outpatient Encounter Medications as of 10/28/2019  Medication Sig  . acyclovir (ZOVIRAX) 400 MG tablet Take 1 tablet (400 mg total) by mouth 2 (two) times daily.  Marland Kitchen atorvastatin (LIPITOR) 80 MG tablet Take 80 mg by mouth daily.  . bortezomib IV (VELCADE) 3.5 MG injection Inject into the vein once a week.  . CYCLOPHOSPHAMIDE IV Inject into the vein once a week.  Marland Kitchen dexamethasone (DECADRON) 4 MG tablet Take 5 tablets (20 mg) on days 1, 8, 15, and 22 of chemo. Repeat every 28 days. Take 2 tablets (69m) daily for 2 days after days 1 and 8 of chemo.  . docusate sodium (COLACE) 100 MG capsule Take 100 mg by mouth daily.  . furosemide (LASIX) 80 MG tablet Take 1 tablet (80 mg total) by mouth 2 (two) times daily.  .Marland Kitchenlevothyroxine (SYNTHROID) 25 MCG tablet Take 1 tablet (25 mcg total) by mouth daily at 6 (six) AM.  . mometasone (NASONEX) 50 MCG/ACT nasal spray Place 1 spray into the nose 2 (two) times daily. 1 spray into each nostril twice daily  . omeprazole (PRILOSEC) 40 MG capsule Take 40 mg by mouth daily.  . phenytoin (DILANTIN) 100 MG ER capsule Take 3 capsules by mouth at bedtime.  .Marland Kitchenacetaminophen (TYLENOL) 325 MG tablet Take 650 mg by mouth every 6 (six) hours as needed.  . prochlorperazine (COMPAZINE) 10 MG tablet Take 1 tablet (10 mg total) by mouth every  6 (six) hours as needed (Nausea or vomiting). (Patient not taking: Reported on 10/28/2019)   No facility-administered encounter medications on file as of 10/28/2019.     ALLERGIES:  Allergies  Allergen Reactions  . Sulfa Antibiotics Hives     PHYSICAL EXAM:  ECOG Performance status: 1  Vitals:   10/28/19 0904  BP: (!) 116/55  Pulse: (!) 102  Resp: 18  Temp: (!) 97.5 F (36.4 C)  SpO2: 98%   Filed Weights   10/28/19 0904  Weight: 192 lb 6.4 oz (87.3 kg)    Physical Exam Constitutional:  Appearance: Normal appearance.  HENT:     Head: Normocephalic.     Right Ear: External ear normal.     Left Ear: External ear normal.     Nose: Nose normal.     Mouth/Throat:     Pharynx: Oropharynx is clear.  Eyes:     Conjunctiva/sclera: Conjunctivae normal.  Neck:     Musculoskeletal: Normal range of motion.  Cardiovascular:     Rate and Rhythm: Regular rhythm. Tachycardia present.     Pulses: Normal pulses.     Heart sounds: Normal heart sounds.  Pulmonary:     Effort: Pulmonary effort is normal.     Breath sounds: Normal breath sounds.  Abdominal:     General: Bowel sounds are normal.  Musculoskeletal: Normal range of motion.     Comments: Decreased range of motion  Skin:    General: Skin is warm.  Neurological:     General: No focal deficit present.     Mental Status: She is alert and oriented to person, place, and time.  Psychiatric:        Mood and Affect: Mood normal.        Behavior: Behavior normal.        Thought Content: Thought content normal.        Judgment: Judgment normal.      LABORATORY DATA:  I have reviewed the labs as listed.  CBC    Component Value Date/Time   WBC 7.7 10/28/2019 0925   RBC 3.37 (L) 10/28/2019 0925   HGB 11.3 (L) 10/28/2019 0925   HCT 34.9 (L) 10/28/2019 0925   HCT 29.7 (L) 09/22/2019 0422   PLT 211 10/28/2019 0925   MCV 103.6 (H) 10/28/2019 0925   MCH 33.5 10/28/2019 0925   MCHC 32.4 10/28/2019 0925   RDW 14.5  10/28/2019 0925   LYMPHSABS 1.6 10/28/2019 0925   MONOABS 1.3 (H) 10/28/2019 0925   EOSABS 0.0 10/28/2019 0925   BASOSABS 0.1 10/28/2019 0925   CMP Latest Ref Rng & Units 10/28/2019 10/17/2019 10/07/2019  Glucose 70 - 99 mg/dL 104(H) 104(H) 85  BUN 8 - 23 mg/dL 15 19 25(H)  Creatinine 0.44 - 1.00 mg/dL 3.83(H) 4.57(H) 3.44(H)  Sodium 135 - 145 mmol/L 137 137 136  Potassium 3.5 - 5.1 mmol/L 3.0(L) 3.6 3.6  Chloride 98 - 111 mmol/L 99 100 98  CO2 22 - 32 mmol/L 28 28 29   Calcium 8.9 - 10.3 mg/dL 7.5(L) 7.6(L) 7.3(L)  Total Protein 6.5 - 8.1 g/dL 4.8(L) 5.3(L) -  Total Bilirubin 0.3 - 1.2 mg/dL 0.3 0.5 -  Alkaline Phos 38 - 126 U/L 158(H) 170(H) -  AST 15 - 41 U/L 53(H) 54(H) -  ALT 0 - 44 U/L 20 14 -           ASSESSMENT & PLAN:   Multiple myeloma ([facility]) 1. Lambda Light Chain Disease: -Presented to the emergency room 09/21/2019 with acute on chronic renal failure. -Multiple myeloma labs completed on 09/22/2019 with negative SPEP, kappa free light chain 74.6, lambda free light chains 1062, ratio 0.07.  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 -Bone marrow biopsy completed on 09/30/2019: Hypercellular marrow with 14% plasma cell, weak lambda restriction. FISH: No high risk gene mutations.  Congo red staining positive for small foci of amyloid deposits. -Skeletal survey negative for lytic lesions. -Today, we discussed these findings in detail.  There is concern for multiple myeloma but also amyloidosis.  We will need to do a thorough  work-up for possible amyloidosis including fat pad biopsy and a cardiac MRI. -Of note echocardiogram was completed on 09/22/2019 which showed an ejection fraction of 65 to 70% with severely increased left ventricular hypertrophy.  This could be due to uncontrolled hypertension or possible amyloid disease. -Per NCCN guidelines, PET/ CT scan has also been recommended for further work-up of lytic lesions. -We will also complete lab  work-up with immunofixation electrophoresis, beta-2 microglobulin, troponin I and T, and pro BNP. -In order not to delay treatment, I have recommended to proceed with weekly  Cytoxan, Velcade, dexamethasone.  As this will not only treat multiple myeloma but also amyloidosis.  We discussed common side effects of drugs recommended.  She will be prescribed acyclovir for VZV prophylaxis. -Patient received cycle one CyBorD on 10/21/2019.  Tolerated exceptionally well. -Further diagnostic testing was completed.  PET/CT was performed at [facility] in [place] on 10/19/2019 and was negative for any lytic lesions or FDG activity. -Fat pad biopsy was completed by Dr. Herschell Dimes. Congo Red staining did not reveal any evidence of amyloid. -Cardiac labs were significant for elevated troponin 0 0.308 ng/mL; BNP elevated at 1086.0 pg/mL -Cardiac MRI order is still pending. -Immunofixation electrophoresis revealed the presence of monoclonal free lambda light chain. -CBC today revealed stable hemoglobin at 11.3; serum creatinine has improved after cycle 1, Day 1 of CyBorD now measures 3.83 previously measured 4.57. -Recommend patient continue with CyBorD  cycle 1 day 8 today.  She will return to clinic in 1 week.          Roger Shelter, [place] [phone]

## 2019-10-28 NOTE — Telephone Encounter (Signed)
Lab called regarding BNP order.  Pro BNP was ordered, which is a send out.  Lab wanted to know if they needed to do the send out or if the in house BNP was ok. I spoke with Reynolds Bowl, NP and in house BNP was suggested.

## 2019-10-28 NOTE — Patient Instructions (Signed)
[facility] Discharge Instructions for Patients Receiving Chemotherapy  Today you received the following chemotherapy agents   To help prevent nausea and vomiting after your treatment, we encourage you to take your nausea medication   If you develop nausea and vomiting that is not controlled by your nausea medication, call the clinic.   BELOW ARE SYMPTOMS THAT SHOULD BE REPORTED IMMEDIATELY:  *FEVER GREATER THAN 100.5 F  *CHILLS WITH OR WITHOUT FEVER  NAUSEA AND VOMITING THAT IS NOT CONTROLLED WITH YOUR NAUSEA MEDICATION  *UNUSUAL SHORTNESS OF BREATH  *UNUSUAL BRUISING OR BLEEDING  TENDERNESS IN MOUTH AND THROAT WITH OR WITHOUT PRESENCE OF ULCERS  *URINARY PROBLEMS  *BOWEL PROBLEMS  UNUSUAL RASH Items with * indicate a potential emergency and should be followed up as soon as possible.  Feel free to call the clinic should you have any questions or concerns. The clinic phone number is [phone].  Please show the CHEMO ALERT CARD at check-in to the Emergency Department and triage nurse.   

## 2019-10-28 NOTE — Progress Notes (Signed)
Pt presents today for Cytoxan and Velcade with f/u appt with RNester NP. VS within parameters for tx. MAR reviewed. Pt has no complaints of any changes since the last visit.    Pt seen by RNester NP. Labs reviewed. Order received to proceed with tx. Give 66meq K+ PO x one dose.   Treatment given today per MD orders. Tolerated infusion without adverse affects. Vital signs stable. No complaints at this time. Discharged from clinic ambulatory. F/U with [facility] as scheduled.

## 2019-11-04 ENCOUNTER — Inpatient Hospital Stay (HOSPITAL_COMMUNITY): Payer: Medicare Other

## 2019-11-04 ENCOUNTER — Other Ambulatory Visit: Payer: Self-pay

## 2019-11-04 ENCOUNTER — Encounter (HOSPITAL_COMMUNITY): Payer: Self-pay | Admitting: Hematology

## 2019-11-04 ENCOUNTER — Inpatient Hospital Stay (HOSPITAL_BASED_OUTPATIENT_CLINIC_OR_DEPARTMENT_OTHER): Payer: Medicare Other | Admitting: Hematology

## 2019-11-04 VITALS — BP 110/61 | HR 90 | Temp 97.1°F | Resp 18

## 2019-11-04 VITALS — BP 129/61 | HR 100 | Temp 97.3°F | Resp 18 | Wt 196.0 lb

## 2019-11-04 DIAGNOSIS — C9000 Multiple myeloma not having achieved remission: Secondary | ICD-10-CM

## 2019-11-04 DIAGNOSIS — Z5112 Encounter for antineoplastic immunotherapy: Secondary | ICD-10-CM | POA: Diagnosis not present

## 2019-11-04 LAB — CBC WITH DIFFERENTIAL/PLATELET
Abs Immature Granulocytes: 0.03 10*3/uL (ref 0.00–0.07)
Basophils Absolute: 0.1 10*3/uL (ref 0.0–0.1)
Basophils Relative: 1 %
Eosinophils Absolute: 0.1 10*3/uL (ref 0.0–0.5)
Eosinophils Relative: 1 %
HCT: 34.9 % — ABNORMAL LOW (ref 36.0–46.0)
Hemoglobin: 11.3 g/dL — ABNORMAL LOW (ref 12.0–15.0)
Immature Granulocytes: 0 %
Lymphocytes Relative: 18 %
Lymphs Abs: 1.3 10*3/uL (ref 0.7–4.0)
MCH: 33.8 pg (ref 26.0–34.0)
MCHC: 32.4 g/dL (ref 30.0–36.0)
MCV: 104.5 fL — ABNORMAL HIGH (ref 80.0–100.0)
Monocytes Absolute: 0.8 10*3/uL (ref 0.1–1.0)
Monocytes Relative: 11 %
Neutro Abs: 5 10*3/uL (ref 1.7–7.7)
Neutrophils Relative %: 69 %
Platelets: 167 10*3/uL (ref 150–400)
RBC: 3.34 MIL/uL — ABNORMAL LOW (ref 3.87–5.11)
RDW: 15.5 % (ref 11.5–15.5)
WBC: 7.4 10*3/uL (ref 4.0–10.5)
nRBC: 0 % (ref 0.0–0.2)

## 2019-11-04 LAB — COMPREHENSIVE METABOLIC PANEL
ALT: 19 U/L (ref 0–44)
AST: 40 U/L (ref 15–41)
Albumin: 2.2 g/dL — ABNORMAL LOW (ref 3.5–5.0)
Alkaline Phosphatase: 128 U/L — ABNORMAL HIGH (ref 38–126)
Anion gap: 10 (ref 5–15)
BUN: 19 mg/dL (ref 8–23)
CO2: 28 mmol/L (ref 22–32)
Calcium: 7.5 mg/dL — ABNORMAL LOW (ref 8.9–10.3)
Chloride: 97 mmol/L — ABNORMAL LOW (ref 98–111)
Creatinine, Ser: 3.67 mg/dL — ABNORMAL HIGH (ref 0.44–1.00)
GFR calc Af Amer: 13 mL/min — ABNORMAL LOW (ref >60)
GFR calc non Af Amer: 11 mL/min — ABNORMAL LOW (ref >60)
Glucose, Bld: 101 mg/dL — ABNORMAL HIGH (ref 70–99)
Potassium: 3.9 mmol/L (ref 3.5–5.1)
Sodium: 135 mmol/L (ref 135–145)
Total Bilirubin: 0.6 mg/dL (ref 0.3–1.2)
Total Protein: 4.9 g/dL — ABNORMAL LOW (ref 6.5–8.1)

## 2019-11-04 MED ORDER — SODIUM CHLORIDE 0.9% FLUSH: 10.0000 mL | INTRAVENOUS | Status: DC | PRN

## 2019-11-04 MED ORDER — SODIUM CHLORIDE 0.9 % IV SOLN
150.0000 mg/m2 | Freq: Once | INTRAVENOUS | Status: AC
  Administered 2019-11-04: 300 mg via INTRAVENOUS
  Filled 2019-11-04: qty 15

## 2019-11-04 MED ORDER — PALONOSETRON HCL INJECTION 0.25 MG/5ML
0.2500 mg | Freq: Once | INTRAVENOUS | Status: AC
  Administered 2019-11-04: 0.25 mg via INTRAVENOUS
  Filled 2019-11-04: qty 5

## 2019-11-04 MED ORDER — BORTEZOMIB CHEMO SQ INJECTION 3.5 MG (2.5MG/ML)
1.5000 mg/m2 | Freq: Once | INTRAMUSCULAR | Status: AC
  Administered 2019-11-04: 3 mg via SUBCUTANEOUS
  Filled 2019-11-04: qty 1.2

## 2019-11-04 MED ORDER — SODIUM CHLORIDE 0.9 % IV SOLN
Freq: Once | INTRAVENOUS | Status: AC
  Administered 2019-11-04: 10:00:00 via INTRAVENOUS

## 2019-11-04 NOTE — Assessment & Plan Note (Signed)
1. Lambda Light Chain Disease: -Presented to the emergency room 09/21/2019 with acute on chronic renal failure. -Multiple myeloma labs completed on 09/22/2019 with negative SPEP, kappa free light chain 74.6, lambda free light chains 1062, ratio 0.07.  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 -Bone marrow biopsy completed on 09/30/2019: Hypercellular marrow with 14% plasma cell, weak lambda restriction. FISH: No high risk gene mutations.  Congo red staining positive for small foci of amyloid deposits. -Skeletal survey negative for lytic lesions. -Today, we discussed these findings in detail.  There is concern for multiple myeloma but also amyloidosis.  We will need to do a thorough work-up for possible amyloidosis including fat pad biopsy and a cardiac MRI. -Of note echocardiogram was completed on 09/22/2019 which showed an ejection fraction of 65 to 70% with severely increased left ventricular hypertrophy.  This could be due to uncontrolled hypertension or possible amyloid disease. -Per NCCN guidelines, PET/ CT scan has also been recommended for further work-up of lytic lesions. -We will also complete lab work-up with immunofixation electrophoresis, beta-2 microglobulin, troponin I and T, and pro BNP. -In order not to delay treatment, I have recommended to proceed with weekly  Cytoxan, Velcade, dexamethasone.  As this will not only treat multiple myeloma but also amyloidosis.  We discussed common side effects of drugs recommended.  She will be prescribed acyclovir for VZV prophylaxis. -Patient received cycle one CyBorD on 10/21/2019.  Tolerated exceptionally well. -Further diagnostic testing was completed.  PET/CT was performed at [facility] in [place] on 10/19/2019 and was negative for any lytic lesions or FDG activity. -Abdominal fat pad biopsy on 10/24/2019 showed benign adipose tissue.  Congo red stain was also negative for amyloid. -Cardiac labs were significant for  elevated troponin 0 0.308 ng/mL; BNP elevated at 1086.0 pg/mL -Immunofixation showed the presence of monoclonal free lambda light chain. -She is tolerating Cytoxan, bortezomib and dexamethasone very well. -She will proceed with her treatment today.  We will schedule her for cardiac MRI. -We will see her back in 2 weeks for follow-up.  She will continue weekly treatments.

## 2019-11-04 NOTE — Progress Notes (Signed)
[address], [place] [address]   CLINIC:  Medical Oncology/Hematology  PCP:  Frances Maywood, [place] [address] [phone]   REASON FOR VISIT:  Follow-up for lambda light chain disease.  CURRENT THERAPY: CyBorD.  BRIEF ONCOLOGIC HISTORY:  Oncology History  Multiple myeloma ([place])  09/30/2019 Initial Diagnosis   Multiple myeloma ([place])   10/21/2019 -  Chemotherapy   The patient had palonosetron (ALOXI) injection 0.25 mg, 0.25 mg, Intravenous,  Once, 1 of 4 cycles Administration: 0.25 mg (10/21/2019), 0.25 mg (10/28/2019), 0.25 mg (11/04/2019) bortezomib SQ (VELCADE) chemo injection 3 mg, 1.5 mg/m2 = 3 mg, Subcutaneous,  Once, 1 of 4 cycles Administration: 3 mg (10/21/2019), 3 mg (10/28/2019), 3 mg (11/04/2019) cyclophosphamide (CYTOXAN) 300 mg in sodium chloride 0.9 % 250 mL chemo infusion, 150 mg/m2 = 300 mg (50 % of original dose 300 mg/m2), Intravenous,  Once, 1 of 4 cycles Dose modification: 150 mg/m2 (50 % of original dose 300 mg/m2, Cycle 1, Reason: Other (see comments), Comment: Hemodialysis, will increase to 241m/m2 weekly) Administration: 300 mg (10/21/2019), 300 mg (10/28/2019), 300 mg (11/04/2019)  for chemotherapy treatment.       CANCER STAGING: Cancer Staging No matching staging information was found for the patient.   INTERVAL HISTORY:  Ms. SWhitehead89y.o. female seen for follow-up of lambda light chain disease.  She is accompanied by her granddaughter today.  She is undergoing dialysis 3 days a week.  She has been doing very well with the treatments for myeloma.  Appetite is 100%.  Energy levels are 100%.  Denies any GI side effects including nausea, vomiting, diarrhea or constipation.  Chronic leg swelling has been stable.  She is also continuing acyclovir for shingles prophylaxis.    REVIEW OF SYSTEMS:  Review of Systems  Cardiovascular: Positive for leg swelling.  All other systems reviewed  and are negative.    PAST MEDICAL/SURGICAL HISTORY:  Past Medical History:  Diagnosis Date  . CHF (congestive heart failure) ([place]   . Coronary artery disease   . Dyspnea   . High cholesterol   . Hypertension   . Hypothyroidism   . Renal disorder   . Seizures ([place]   . Sleep apnea    Past Surgical History:  Procedure Laterality Date  . ABDOMINAL HYSTERECTOMY    . AV FISTULA PLACEMENT Left 10/07/2019   Procedure: ARTERIOVENOUS (AV)  BRACHIOCEPHALIC FISTULA CREATION;  Surgeon: CMarty Heck MD;  [facility]  Service: Vascular;  Laterality: Left;  . BRAIN SURGERY     benign tumor removal  . EXCHANGE OF A DIALYSIS CATHETER Right 10/07/2019   Procedure: EXCHANGE OF A DIALYSIS CATHETER;  Surgeon: CMarty Heck MD;  [facility]  Service: Vascular;  Laterality: Right;     SOCIAL HISTORY:  Social History   Socioeconomic History  . Marital status: Widowed    Spouse name: Not on file  . Number of children: 4  . Years of education: Not on file  . Highest education level: Not on file  Occupational History  . Occupation: reitred  Social Needs  . Financial resource strain: Not hard at all  . Food insecurity    Worry: Never true    Inability: Never true  . Transportation needs    Medical: No    Non-medical: No  Tobacco Use  . Smoking status: Never Smoker  . Smokeless tobacco: Never Used  Substance and Sexual Activity  . Alcohol  use: Never    Frequency: Never  . Drug use: Never  . Sexual activity: Not Currently  Lifestyle  . Physical activity    Days per week: 7 days    Minutes per session: 30 min  . Stress: Only a little  Relationships  . Social connections    Talks on phone: More than three times a week    Gets together: More than three times a week    Attends religious service: More than 4 times per year    Active member of club or organization: Yes    Attends meetings of clubs or organizations: More than 4 times per year    Relationship  status: Widowed  . Intimate partner violence    Fear of current or ex partner: No    Emotionally abused: No    Physically abused: No    Forced sexual activity: No  Other Topics Concern  . Not on file  Social History Narrative  . Not on file    FAMILY HISTORY:  Family History  Problem Relation Age of Onset  . Cancer Mother   . Cancer Sister   . Hypertension Father   . Cancer Brother   . Alcohol abuse Brother   . Hypertension Son   . Heart attack Son     CURRENT MEDICATIONS:  Outpatient Encounter Medications as of 11/04/2019  Medication Sig  . acetaminophen (TYLENOL) 325 MG tablet Take 650 mg by mouth every 6 (six) hours as needed.  Marland Kitchen acyclovir (ZOVIRAX) 400 MG tablet Take 1 tablet (400 mg total) by mouth 2 (two) times daily.  Marland Kitchen atorvastatin (LIPITOR) 80 MG tablet Take 80 mg by mouth daily.  . bortezomib IV (VELCADE) 3.5 MG injection Inject into the vein once a week.  . CYCLOPHOSPHAMIDE IV Inject into the vein once a week.  Marland Kitchen dexamethasone (DECADRON) 4 MG tablet Take 5 tablets (20 mg) on days 1, 8, 15, and 22 of chemo. Repeat every 28 days. Take 2 tablets (34m) daily for 2 days after days 1 and 8 of chemo.  . docusate sodium (COLACE) 100 MG capsule Take 100 mg by mouth daily.  . furosemide (LASIX) 80 MG tablet Take 1 tablet (80 mg total) by mouth 2 (two) times daily.  .Marland Kitchenlevothyroxine (SYNTHROID) 25 MCG tablet Take 1 tablet (25 mcg total) by mouth daily at 6 (six) AM.  . mometasone (NASONEX) 50 MCG/ACT nasal spray Place 1 spray into the nose 2 (two) times daily. 1 spray into each nostril twice daily  . omeprazole (PRILOSEC) 40 MG capsule Take 40 mg by mouth daily.  . phenytoin (DILANTIN) 100 MG ER capsule Take 3 capsules by mouth at bedtime.  . prochlorperazine (COMPAZINE) 10 MG tablet Take 1 tablet (10 mg total) by mouth every 6 (six) hours as needed (Nausea or vomiting). (Patient not taking: Reported on 10/28/2019)   No facility-administered encounter medications on file as of  11/04/2019.     ALLERGIES:  Allergies  Allergen Reactions  . Sulfa Antibiotics Hives     PHYSICAL EXAM:  ECOG Performance status: 1  Vitals:   11/04/19 0849  BP: 129/61  Pulse: 100  Resp: 18  Temp: (!) 97.3 F (36.3 C)  SpO2: 97%   Filed Weights   11/04/19 0849  Weight: 196 lb (88.9 kg)    Physical Exam Vitals signs reviewed.  Constitutional:      Appearance: Normal appearance.  Cardiovascular:     Rate and Rhythm: Normal rate and regular rhythm.  Heart sounds: Normal heart sounds.  Pulmonary:     Effort: Pulmonary effort is normal.     Breath sounds: Normal breath sounds.  Abdominal:     General: There is no distension.     Palpations: Abdomen is soft. There is no mass.  Musculoskeletal:        General: Swelling present.     Right lower leg: Edema present.     Left lower leg: Edema present.  Skin:    General: Skin is warm.  Neurological:     General: No focal deficit present.     Mental Status: She is alert and oriented to person, place, and time.  Psychiatric:        Mood and Affect: Mood normal.        Behavior: Behavior normal.      LABORATORY DATA:  I have reviewed the labs as listed.  CBC    Component Value Date/Time   WBC 7.4 11/04/2019 0828   RBC 3.34 (L) 11/04/2019 0828   HGB 11.3 (L) 11/04/2019 0828   HCT 34.9 (L) 11/04/2019 0828   HCT 29.7 (L) 09/22/2019 0422   PLT 167 11/04/2019 0828   MCV 104.5 (H) 11/04/2019 0828   MCH 33.8 11/04/2019 0828   MCHC 32.4 11/04/2019 0828   RDW 15.5 11/04/2019 0828   LYMPHSABS 1.3 11/04/2019 0828   MONOABS 0.8 11/04/2019 0828   EOSABS 0.1 11/04/2019 0828   BASOSABS 0.1 11/04/2019 0828   CMP Latest Ref Rng & Units 11/04/2019 10/28/2019 10/17/2019  Glucose 70 - 99 mg/dL 101(H) 104(H) 104(H)  BUN 8 - 23 mg/dL 19 15 19   Creatinine 0.44 - 1.00 mg/dL 3.67(H) 3.83(H) 4.57(H)  Sodium 135 - 145 mmol/L 135 137 137  Potassium 3.5 - 5.1 mmol/L 3.9 3.0(L) 3.6  Chloride 98 - 111 mmol/L 97(L) 99 100  CO2  22 - 32 mmol/L 28 28 28   Calcium 8.9 - 10.3 mg/dL 7.5(L) 7.5(L) 7.6(L)  Total Protein 6.5 - 8.1 g/dL 4.9(L) 4.8(L) 5.3(L)  Total Bilirubin 0.3 - 1.2 mg/dL 0.6 0.3 0.5  Alkaline Phos 38 - 126 U/L 128(H) 158(H) 170(H)  AST 15 - 41 U/L 40 53(H) 54(H)  ALT 0 - 44 U/L 19 20 14        DIAGNOSTIC IMAGING:  I have independently reviewed the scans and discussed with the patient.      ASSESSMENT & PLAN:   Multiple myeloma ([place]) 1. Lambda Light Chain Disease: -Presented to the emergency room 09/21/2019 with acute on chronic renal failure. -Multiple myeloma labs completed on 09/22/2019 with negative SPEP, kappa free light chain 74.6, lambda free light chains 1062, ratio 0.07.  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 -Bone marrow biopsy completed on 09/30/2019: Hypercellular marrow with 14% plasma cell, weak lambda restriction. FISH: No high risk gene mutations.  Congo red staining positive for small foci of amyloid deposits. -Skeletal survey negative for lytic lesions. -Today, we discussed these findings in detail.  There is concern for multiple myeloma but also amyloidosis.  We will need to do a thorough work-up for possible amyloidosis including fat pad biopsy and a cardiac MRI. -Of note echocardiogram was completed on 09/22/2019 which showed an ejection fraction of 65 to 70% with severely increased left ventricular hypertrophy.  This could be due to uncontrolled hypertension or possible amyloid disease. -Per NCCN guidelines, PET/ CT scan has also been recommended for further work-up of lytic lesions. -We will also complete lab work-up with immunofixation electrophoresis, beta-2 microglobulin, troponin  I and T, and pro BNP. -In order not to delay treatment, I have recommended to proceed with weekly  Cytoxan, Velcade, dexamethasone.  As this will not only treat multiple myeloma but also amyloidosis.  We discussed common side effects of drugs recommended.  She will be  prescribed acyclovir for VZV prophylaxis. -Patient received cycle one CyBorD on 10/21/2019.  Tolerated exceptionally well. -Further diagnostic testing was completed.  PET/CT was performed at [facility] in [place] on 10/19/2019 and was negative for any lytic lesions or FDG activity. -Abdominal fat pad biopsy on 10/24/2019 showed benign adipose tissue.  Congo red stain was also negative for amyloid. -Cardiac labs were significant for elevated troponin 0 0.308 ng/mL; BNP elevated at 1086.0 pg/mL -Immunofixation showed the presence of monoclonal free lambda light chain. -She is tolerating Cytoxan, bortezomib and dexamethasone very well. -She will proceed with her treatment today.  We will schedule her for cardiac MRI. -We will see her back in 2 weeks for follow-up.  She will continue weekly treatments.       Orders placed this encounter:  Orders Placed This Encounter  Procedures  . MR CARDIAC MORPHOLOGY W WO CONTRAST  . CBC with Differential/Platelet  . Comprehensive metabolic panel  . Protein electrophoresis, serum  . Kappa/lambda light chains  . Lactate dehydrogenase      Derek Jack, MD [place] [phone]

## 2019-11-04 NOTE — Progress Notes (Signed)
Serum Creatinine 3.67 today.  Patient seen by Dr. Delton Coombes with lab review and ok to treat today verbal order.   Patient tolerated chemotherapy with no complaints voiced.  Peripheral IV site clean and dry with no bruising or swelling noted at site.  Good blood return noted before and after administration of chemotherapy.  Band aid applied.  Patient left ambulatory with VSS and no s/s of distress noted.

## 2019-11-04 NOTE — Patient Instructions (Addendum)
[place] at [facility] Discharge Instructions  You were seen today by Dr. Delton Coombes. He went over your recent lab results. Continue weekly treatments. He will see you back in 2 weeks for labs, treatment and follow up.   Thank you for choosing [place] at [facility] to provide your oncology and hematology care.  To afford each patient quality time with our provider, please arrive at least 15 minutes before your scheduled appointment time.   If you have a lab appointment with the [facility] please come in thru the  Main Entrance and check in at the main information desk  You need to re-schedule your appointment should you arrive 10 or more minutes late.  We strive to give you quality time with our providers, and arriving late affects you and other patients whose appointments are after yours.  Also, if you no show three or more times for appointments you may be dismissed from the clinic at the providers discretion.     Again, thank you for choosing [facility].  Our hope is that these requests will decrease the amount of time that you wait before being seen by our physicians.       _____________________________________________________________  Should you have questions after your visit to [facility], please contact our office at [phone] between the hours of 8:00 a.m. and 4:30 p.m.  Voicemails left after 4:00 p.m. will not be returned until the following business day.  For prescription refill requests, have your pharmacy contact our office and allow 72 hours.    Cancer Center Support Programs:   > Cancer Support Group  2nd Tuesday of the month 1pm-2pm, Journey Room

## 2019-11-04 NOTE — Progress Notes (Signed)
11/04/19  Maintain Cytoxan at 50% dose reduction  150 mg.m2  Confirmed with Horris Latino RN patient took dexamethasone at home this morning.   Orders entered to reflect above.   T.O Dr Rhys Martini, PharmD

## 2019-11-11 ENCOUNTER — Ambulatory Visit (HOSPITAL_COMMUNITY): Payer: Medicare Other

## 2019-11-11 ENCOUNTER — Inpatient Hospital Stay (HOSPITAL_COMMUNITY): Payer: Medicare Other

## 2019-11-11 ENCOUNTER — Other Ambulatory Visit (HOSPITAL_COMMUNITY): Payer: Medicare Other

## 2019-11-11 ENCOUNTER — Other Ambulatory Visit: Payer: Self-pay

## 2019-11-11 VITALS — BP 110/60 | HR 92 | Temp 98.8°F | Resp 18 | Wt 200.0 lb

## 2019-11-11 DIAGNOSIS — C9000 Multiple myeloma not having achieved remission: Secondary | ICD-10-CM

## 2019-11-11 DIAGNOSIS — Z5112 Encounter for antineoplastic immunotherapy: Secondary | ICD-10-CM | POA: Diagnosis not present

## 2019-11-11 LAB — CBC WITH DIFFERENTIAL/PLATELET
Abs Immature Granulocytes: 0.03 10*3/uL (ref 0.00–0.07)
Basophils Absolute: 0.1 10*3/uL (ref 0.0–0.1)
Basophils Relative: 2 %
Eosinophils Absolute: 0.1 10*3/uL (ref 0.0–0.5)
Eosinophils Relative: 2 %
HCT: 35.6 % — ABNORMAL LOW (ref 36.0–46.0)
Hemoglobin: 11.6 g/dL — ABNORMAL LOW (ref 12.0–15.0)
Immature Granulocytes: 0 %
Lymphocytes Relative: 14 %
Lymphs Abs: 0.9 10*3/uL (ref 0.7–4.0)
MCH: 34.4 pg — ABNORMAL HIGH (ref 26.0–34.0)
MCHC: 32.6 g/dL (ref 30.0–36.0)
MCV: 105.6 fL — ABNORMAL HIGH (ref 80.0–100.0)
Monocytes Absolute: 0.7 10*3/uL (ref 0.1–1.0)
Monocytes Relative: 10 %
Neutro Abs: 5 10*3/uL (ref 1.7–7.7)
Neutrophils Relative %: 72 %
Platelets: 173 10*3/uL (ref 150–400)
RBC: 3.37 MIL/uL — ABNORMAL LOW (ref 3.87–5.11)
RDW: 16.8 % — ABNORMAL HIGH (ref 11.5–15.5)
WBC: 6.9 10*3/uL (ref 4.0–10.5)
nRBC: 0.3 % — ABNORMAL HIGH (ref 0.0–0.2)

## 2019-11-11 LAB — COMPREHENSIVE METABOLIC PANEL
ALT: 19 U/L (ref 0–44)
AST: 38 U/L (ref 15–41)
Albumin: 2.3 g/dL — ABNORMAL LOW (ref 3.5–5.0)
Alkaline Phosphatase: 116 U/L (ref 38–126)
Anion gap: 9 (ref 5–15)
BUN: 28 mg/dL — ABNORMAL HIGH (ref 8–23)
CO2: 29 mmol/L (ref 22–32)
Calcium: 7.6 mg/dL — ABNORMAL LOW (ref 8.9–10.3)
Chloride: 99 mmol/L (ref 98–111)
Creatinine, Ser: 4.15 mg/dL — ABNORMAL HIGH (ref 0.44–1.00)
GFR calc Af Amer: 11 mL/min — ABNORMAL LOW (ref >60)
GFR calc non Af Amer: 10 mL/min — ABNORMAL LOW (ref >60)
Glucose, Bld: 109 mg/dL — ABNORMAL HIGH (ref 70–99)
Potassium: 3.9 mmol/L (ref 3.5–5.1)
Sodium: 137 mmol/L (ref 135–145)
Total Bilirubin: 0.5 mg/dL (ref 0.3–1.2)
Total Protein: 5 g/dL — ABNORMAL LOW (ref 6.5–8.1)

## 2019-11-11 MED ORDER — SODIUM CHLORIDE 0.9 % IV SOLN
150.0000 mg/m2 | Freq: Once | INTRAVENOUS | Status: AC
  Administered 2019-11-11: 300 mg via INTRAVENOUS
  Filled 2019-11-11: qty 15

## 2019-11-11 MED ORDER — BORTEZOMIB CHEMO SQ INJECTION 3.5 MG (2.5MG/ML)
1.5000 mg/m2 | Freq: Once | INTRAMUSCULAR | Status: AC
  Administered 2019-11-11: 3 mg via SUBCUTANEOUS
  Filled 2019-11-11: qty 1.2

## 2019-11-11 MED ORDER — PALONOSETRON HCL INJECTION 0.25 MG/5ML
0.2500 mg | Freq: Once | INTRAVENOUS | Status: AC
  Administered 2019-11-11: 0.25 mg via INTRAVENOUS
  Filled 2019-11-11: qty 5

## 2019-11-11 MED ORDER — SODIUM CHLORIDE 0.9 % IV SOLN
Freq: Once | INTRAVENOUS | Status: AC
  Administered 2019-11-11: 10:00:00 via INTRAVENOUS

## 2019-11-11 NOTE — Progress Notes (Signed)
Sophia Mcmahon tolerated Cytoxan infusion and Velcade injection well without complaints or incident. Peripheral IV site checked by 2 RN's with positive blood return noted prior to and after infusion. VSS upon discharge. Pt discharged self ambulatory in satisfactory condition

## 2019-11-11 NOTE — Progress Notes (Signed)
11/11/19  On 11/10/19 confirmed with Dr Delton Coombes to maintain cyclophosphamide dose at 150 mg/m2 due to chronic renal insufficiency.  Orders updated as above.  V.O. Dr Rhys Martini, PharmD

## 2019-11-11 NOTE — Patient Instructions (Signed)
[facility] Discharge Instructions for Patients Receiving Chemotherapy   Beginning January 23rd 2017 lab work for the [facility] will be done in the  Main lab at [facility] on 1st floor. If you have a lab appointment with the [facility] please come in thru the  Main Entrance and check in at the main information desk   Today you received the following chemotherapy agents Cytoxan and Velcade. Follow-up as scheduled. Call clinic for any questions or concerns  To help prevent nausea and vomiting after your treatment, we encourage you to take your nausea medication   If you develop nausea and vomiting, or diarrhea that is not controlled by your medication, call the clinic.  The clinic phone number is [phone]. Office hours are Monday-Friday 8:30am-5:00pm.  BELOW ARE SYMPTOMS THAT SHOULD BE REPORTED IMMEDIATELY:  *FEVER GREATER THAN 101.0 F  *CHILLS WITH OR WITHOUT FEVER  NAUSEA AND VOMITING THAT IS NOT CONTROLLED WITH YOUR NAUSEA MEDICATION  *UNUSUAL SHORTNESS OF BREATH  *UNUSUAL BRUISING OR BLEEDING  TENDERNESS IN MOUTH AND THROAT WITH OR WITHOUT PRESENCE OF ULCERS  *URINARY PROBLEMS  *BOWEL PROBLEMS  UNUSUAL RASH Items with * indicate a potential emergency and should be followed up as soon as possible. If you have an emergency after office hours please contact your primary care physician or go to the nearest emergency department.  Please call the clinic during office hours if you have any questions or concerns.   You may also contact the Patient Navigator at [phone] should you have any questions or need assistance in obtaining follow up care.      Resources For Cancer Patients and their Caregivers ? American Cancer Society: Can assist with transportation, wigs, general needs, runs Look Good Feel Better.        989 608 7391 ? Cancer Care: Provides financial assistance, online support groups, medication/co-pay assistance.   1-800-813-HOPE 445-799-3662) ? [place] Assists [place] cancer patients and their families through emotional , educational and financial support.  651-408-3509 ? [place] DSS Where to apply for food stamps, Medicaid and utility assistance. (640)404-5188 ? [facility]: Transportation to medical appointments. (703)192-6356 ? Social Security Administration: May apply for disability if have a Stage IV cancer. (684)335-8739 (931) 716-5653 ? [employer], Disability and Transit Services: Assists with nutrition, care and transit needs. 458-236-1040

## 2019-11-11 NOTE — Progress Notes (Signed)
Labs reviewed today with Reynolds Bowl NP. Proceed as planned. BUN 28, Creatinine 4.15.

## 2019-11-16 ENCOUNTER — Encounter (HOSPITAL_COMMUNITY): Payer: Self-pay | Admitting: *Deleted

## 2019-11-16 NOTE — Progress Notes (Signed)
Patient's daughter, Danton Clap, called clinic requesting that her labs be released to [facility].  I have sent a message to physician to have labs released.

## 2019-11-18 ENCOUNTER — Other Ambulatory Visit: Payer: Self-pay

## 2019-11-18 DIAGNOSIS — N184 Chronic kidney disease, stage 4 (severe): Secondary | ICD-10-CM

## 2019-11-18 DIAGNOSIS — N179 Acute kidney failure, unspecified: Secondary | ICD-10-CM

## 2019-11-21 ENCOUNTER — Inpatient Hospital Stay (HOSPITAL_COMMUNITY): Payer: Medicare Other

## 2019-11-21 ENCOUNTER — Telehealth (HOSPITAL_COMMUNITY): Payer: Self-pay | Admitting: *Deleted

## 2019-11-21 ENCOUNTER — Encounter (HOSPITAL_COMMUNITY): Payer: Self-pay | Admitting: Hematology

## 2019-11-21 ENCOUNTER — Inpatient Hospital Stay (HOSPITAL_COMMUNITY): Payer: Medicare Other | Admitting: Hematology

## 2019-11-21 ENCOUNTER — Other Ambulatory Visit: Payer: Self-pay

## 2019-11-21 VITALS — BP 113/58 | HR 89 | Temp 96.8°F | Resp 18

## 2019-11-21 DIAGNOSIS — C9000 Multiple myeloma not having achieved remission: Secondary | ICD-10-CM

## 2019-11-21 DIAGNOSIS — Z5112 Encounter for antineoplastic immunotherapy: Secondary | ICD-10-CM | POA: Diagnosis not present

## 2019-11-21 LAB — CBC WITH DIFFERENTIAL/PLATELET
Abs Immature Granulocytes: 0.04 10*3/uL (ref 0.00–0.07)
Basophils Absolute: 0.1 10*3/uL (ref 0.0–0.1)
Basophils Relative: 2 %
Eosinophils Absolute: 0.2 10*3/uL (ref 0.0–0.5)
Eosinophils Relative: 2 %
HCT: 36.8 % (ref 36.0–46.0)
Hemoglobin: 11.8 g/dL — ABNORMAL LOW (ref 12.0–15.0)
Immature Granulocytes: 1 %
Lymphocytes Relative: 15 %
Lymphs Abs: 1.1 10*3/uL (ref 0.7–4.0)
MCH: 34.2 pg — ABNORMAL HIGH (ref 26.0–34.0)
MCHC: 32.1 g/dL (ref 30.0–36.0)
MCV: 106.7 fL — ABNORMAL HIGH (ref 80.0–100.0)
Monocytes Absolute: 0.8 10*3/uL (ref 0.1–1.0)
Monocytes Relative: 11 %
Neutro Abs: 5.1 10*3/uL (ref 1.7–7.7)
Neutrophils Relative %: 69 %
Platelets: 209 10*3/uL (ref 150–400)
RBC: 3.45 MIL/uL — ABNORMAL LOW (ref 3.87–5.11)
RDW: 17.6 % — ABNORMAL HIGH (ref 11.5–15.5)
WBC: 7.4 10*3/uL (ref 4.0–10.5)
nRBC: 0.5 % — ABNORMAL HIGH (ref 0.0–0.2)

## 2019-11-21 LAB — COMPREHENSIVE METABOLIC PANEL
ALT: 21 U/L (ref 0–44)
AST: 35 U/L (ref 15–41)
Albumin: 2.5 g/dL — ABNORMAL LOW (ref 3.5–5.0)
Alkaline Phosphatase: 109 U/L (ref 38–126)
Anion gap: 12 (ref 5–15)
BUN: 31 mg/dL — ABNORMAL HIGH (ref 8–23)
CO2: 28 mmol/L (ref 22–32)
Calcium: 7.9 mg/dL — ABNORMAL LOW (ref 8.9–10.3)
Chloride: 100 mmol/L (ref 98–111)
Creatinine, Ser: 5.62 mg/dL — ABNORMAL HIGH (ref 0.44–1.00)
GFR calc Af Amer: 8 mL/min — ABNORMAL LOW (ref >60)
GFR calc non Af Amer: 7 mL/min — ABNORMAL LOW (ref >60)
Glucose, Bld: 102 mg/dL — ABNORMAL HIGH (ref 70–99)
Potassium: 4.1 mmol/L (ref 3.5–5.1)
Sodium: 140 mmol/L (ref 135–145)
Total Bilirubin: 0.4 mg/dL (ref 0.3–1.2)
Total Protein: 5.5 g/dL — ABNORMAL LOW (ref 6.5–8.1)

## 2019-11-21 LAB — LACTATE DEHYDROGENASE: LDH: 262 U/L — ABNORMAL HIGH (ref 98–192)

## 2019-11-21 MED ORDER — SODIUM CHLORIDE 0.9 % IV SOLN
Freq: Once | INTRAVENOUS | Status: AC
  Administered 2019-11-21: 10:00:00 via INTRAVENOUS

## 2019-11-21 MED ORDER — PALONOSETRON HCL INJECTION 0.25 MG/5ML
0.2500 mg | Freq: Once | INTRAVENOUS | Status: AC
  Administered 2019-11-21: 0.25 mg via INTRAVENOUS
  Filled 2019-11-21: qty 5

## 2019-11-21 MED ORDER — BORTEZOMIB CHEMO SQ INJECTION 3.5 MG (2.5MG/ML)
1.5000 mg/m2 | Freq: Once | INTRAMUSCULAR | Status: AC
  Administered 2019-11-21: 3 mg via SUBCUTANEOUS
  Filled 2019-11-21: qty 1.2

## 2019-11-21 MED ORDER — SODIUM CHLORIDE 0.9 % IV SOLN
225.0000 mg/m2 | Freq: Once | INTRAVENOUS | Status: AC
  Administered 2019-11-21: 460 mg via INTRAVENOUS
  Filled 2019-11-21: qty 23

## 2019-11-21 NOTE — Telephone Encounter (Signed)
11/21/2019 9:42 Confirmed with daughter/CCS/ss

## 2019-11-21 NOTE — Patient Instructions (Addendum)
[facility] at [facility] Discharge Instructions  You were seen today by Dr. Katragadda. He went over your recent lab results. He will see you back in 1 week for labs and follow up.   Thank you for choosing [facility] at [facility] to provide your oncology and hematology care.  To afford each patient quality time with our provider, please arrive at least 15 minutes before your scheduled appointment time.   If you have a lab appointment with the Cancer Center please come in thru the  Main Entrance and check in at the main information desk  You need to re-schedule your appointment should you arrive 10 or more minutes late.  We strive to give you quality time with our providers, and arriving late affects you and other patients whose appointments are after yours.  Also, if you no show three or more times for appointments you may be dismissed from the clinic at the providers discretion.     Again, thank you for choosing [facility].  Our hope is that these requests will decrease the amount of time that you wait before being seen by our physicians.       _____________________________________________________________  Should you have questions after your visit to [facility], please contact our office at [phone] between the hours of 8:00 a.m. and 4:30 p.m.  Voicemails left after 4:00 p.m. will not be returned until the following business day.  For prescription refill requests, have your pharmacy contact our office and allow 72 hours.    Cancer Center Support Programs:   > Cancer Support Group  2nd Tuesday of the month 1pm-2pm, Journey Room    

## 2019-11-21 NOTE — Progress Notes (Signed)
Labs reviewed with MD , proceed with treatment today.  Treatment given per orders. Patient tolerated it well without problems. Vitals stable and discharged home from clinic ambulatory. Follow up as scheduled.

## 2019-11-21 NOTE — Assessment & Plan Note (Addendum)
1. Lambda Light Chain Disease: -Presented to the emergency room 09/21/2019 with acute on chronic renal failure. -Multiple myeloma labs completed on 09/22/2019 with negative SPEP, kappa free light chain 74.6, lambda free light chains 1062, ratio 0.07.  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  24-hour urine total protein was 5602 mg. -Bone marrow biopsy completed on 09/30/2019: Hypercellular marrow with 14% plasma cell, weak lambda restriction. FISH: No high risk gene mutations.  Congo red staining positive for small foci of amyloid deposits.  Chromosome analysis was 20, XX. -Skeletal survey negative for lytic lesions. -Of note echocardiogram was completed on 09/22/2019 which showed an ejection fraction of 65 to 70% with severely increased left ventricular hypertrophy.  This could be due to uncontrolled hypertension or possible amyloid disease. -Cycle 1 of CyBorD on 10/21/2019. - PET/CT was performed at [facility] in [place] on 10/19/2019 and was negative for any lytic lesions or FDG activity. -Abdominal fat pad biopsy on 10/24/2019 showed benign adipose tissue.  Congo red stain was also negative for amyloid. -BNP elevated at 1086.  Troponin T was 0.308 (less than 0.011). -Immunofixation positive for presence of monoclonal free lambda light chain.  Beta-2 microglobulin was 20.8. -So far she received Velcade and Cytoxan along with 20 mg of dexamethasone weekly on 10/21/2019, 10/28/2019, 11/04/2019 and 11/11/2019.  We have sent myeloma labs from today which are pending. -We have tried to do cardiac MRI.  Unfortunately she could not get contrast.  I have contacted Dr. Haroldine Laws who kindly reviewed the echocardiogram report with me.  He sees that both right ventricle and left ventricle are enlarged, mostly consistent with amyloidosis rather than hypertension.  For definitive diagnosis, biopsy is the only option. -Hence we will hold off on doing any further imaging at this time. -I  will see her back in 1 week to discuss results of myeloma panel. -I will also consider seeking opinion from transplant team although she will be high risk candidate given her ESRD/HD and age.

## 2019-11-21 NOTE — Progress Notes (Signed)
[place], [place] [address]   CLINIC:  Medical Oncology/Hematology  PCP:  Frances Maywood, [place] [address] [phone]   REASON FOR VISIT:  Follow-up for lambda light chain disease.  CURRENT THERAPY: CyBorD.  BRIEF ONCOLOGIC HISTORY:  Oncology History  Multiple myeloma ([place])  09/30/2019 Initial Diagnosis   Multiple myeloma ([place])   10/21/2019 -  Chemotherapy   The patient had palonosetron (ALOXI) injection 0.25 mg, 0.25 mg, Intravenous,  Once, 2 of 4 cycles Administration: 0.25 mg (10/21/2019), 0.25 mg (10/28/2019), 0.25 mg (11/04/2019), 0.25 mg (11/11/2019) bortezomib SQ (VELCADE) chemo injection 3 mg, 1.5 mg/m2 = 3 mg, Subcutaneous,  Once, 2 of 4 cycles Administration: 3 mg (10/21/2019), 3 mg (10/28/2019), 3 mg (11/04/2019), 3 mg (11/11/2019) cyclophosphamide (CYTOXAN) 300 mg in sodium chloride 0.9 % 250 mL chemo infusion, 150 mg/m2 = 300 mg (50 % of original dose 300 mg/m2), Intravenous,  Once, 2 of 4 cycles Dose modification: 150 mg/m2 (50 % of original dose 300 mg/m2, Cycle 1, Reason: Other (see comments), Comment: Hemodialysis, will increase to 259m/m2 weekly), 150 mg/m2 (original dose 300 mg/m2, Cycle 1, Reason: Provider Judgment, Comment: creatinine elevated on dialysis), 225 mg/m2 (original dose 300 mg/m2, Cycle 2, Reason: Provider Judgment) Administration: 300 mg (10/21/2019), 300 mg (10/28/2019), 300 mg (11/04/2019), 300 mg (11/11/2019)  for chemotherapy treatment.       CANCER STAGING: Cancer Staging No matching staging information was found for the patient.   INTERVAL HISTORY:  [name]89y.o. female seen for lambda light chain disease.  She is undergoing hemodialysis on Tuesday Thursday and Saturday.  She is tolerating treatment extremely well.  Denies any tingling or numbness in extremities.  Leg swellings have improved.  She is moving around well.  She is also eating better.  Appetite is  100%.  Energy levels are 75%.  Her daughter is accompanying her today.  She is taking dexamethasone 20 mg on days of treatment.  Denies any nausea vomiting or diarrhea.  Denies any signs or symptoms of PND or orthopnea.    REVIEW OF SYSTEMS:  Review of Systems  Cardiovascular: Positive for leg swelling.  All other systems reviewed and are negative.    PAST MEDICAL/SURGICAL HISTORY:  Past Medical History:  Diagnosis Date  . CHF (congestive heart failure) ([facility]   . Coronary artery disease   . Dyspnea   . High cholesterol   . Hypertension   . Hypothyroidism   . Renal disorder   . Seizures ([facility]   . Sleep apnea    Past Surgical History:  Procedure Laterality Date  . ABDOMINAL HYSTERECTOMY    . AV FISTULA PLACEMENT Left 10/07/2019   Procedure: ARTERIOVENOUS (AV)  BRACHIOCEPHALIC FISTULA CREATION;  Surgeon: CMarty Heck MD;  Location: [facility]  Service: Vascular;  Laterality: Left;  . BRAIN SURGERY     benign tumor removal  . EXCHANGE OF A DIALYSIS CATHETER Right 10/07/2019   Procedure: EXCHANGE OF A DIALYSIS CATHETER;  Surgeon: CMarty Heck MD;  Location[facility]  Service: Vascular;  Laterality: Right;     SOCIAL HISTORY:  Social History   Socioeconomic History  . Marital status: Widowed    Spouse name: Not on file  . Number of children: 4  . Years of education: Not on file  . Highest education level: Not on file  Occupational History  . Occupation: reitred  Social Needs  . Financial resource strain: Not hard at  all  . Food insecurity    Worry: Never true    Inability: Never true  . Transportation needs    Medical: No    Non-medical: No  Tobacco Use  . Smoking status: Never Smoker  . Smokeless tobacco: Never Used  Substance and Sexual Activity  . Alcohol use: Never    Frequency: Never  . Drug use: Never  . Sexual activity: Not Currently  Lifestyle  . Physical activity    Days per week: 7 days    Minutes per session: 30 min  . Stress: Only a  little  Relationships  . Social connections    Talks on phone: More than three times a week    Gets together: More than three times a week    Attends religious service: More than 4 times per year    Active member of club or organization: Yes    Attends meetings of clubs or organizations: More than 4 times per year    Relationship status: Widowed  . Intimate partner violence    Fear of current or ex partner: No    Emotionally abused: No    Physically abused: No    Forced sexual activity: No  Other Topics Concern  . Not on file  Social History Narrative  . Not on file    FAMILY HISTORY:  Family History  Problem Relation Age of Onset  . Cancer Mother   . Cancer Sister   . Hypertension Father   . Cancer Brother   . Alcohol abuse Brother   . Hypertension Son   . Heart attack Son     CURRENT MEDICATIONS:  Outpatient Encounter Medications as of 11/21/2019  Medication Sig  . acetaminophen (TYLENOL) 325 MG tablet Take 650 mg by mouth every 6 (six) hours as needed.  Marland Kitchen acyclovir (ZOVIRAX) 400 MG tablet Take 1 tablet (400 mg total) by mouth 2 (two) times daily.  Marland Kitchen atorvastatin (LIPITOR) 80 MG tablet Take 80 mg by mouth daily.  . bortezomib IV (VELCADE) 3.5 MG injection Inject into the vein once a week.  . CYCLOPHOSPHAMIDE IV Inject into the vein once a week.  Marland Kitchen dexamethasone (DECADRON) 4 MG tablet Take 5 tablets (20 mg) on days 1, 8, 15, and 22 of chemo. Repeat every 28 days. Take 2 tablets (59m) daily for 2 days after days 1 and 8 of chemo.  . docusate sodium (COLACE) 100 MG capsule Take 100 mg by mouth daily.  . furosemide (LASIX) 80 MG tablet Take 1 tablet (80 mg total) by mouth 2 (two) times daily.  .Marland Kitchenlevothyroxine (SYNTHROID) 25 MCG tablet Take 1 tablet (25 mcg total) by mouth daily at 6 (six) AM.  . mometasone (NASONEX) 50 MCG/ACT nasal spray Place 1 spray into the nose 2 (two) times daily. 1 spray into each nostril twice daily  . omeprazole (PRILOSEC) 40 MG capsule Take 40 mg  by mouth daily.  . phenytoin (DILANTIN) 100 MG ER capsule Take 3 capsules by mouth at bedtime.  . prochlorperazine (COMPAZINE) 10 MG tablet Take 1 tablet (10 mg total) by mouth every 6 (six) hours as needed (Nausea or vomiting). (Patient not taking: Reported on 10/28/2019)  . torsemide (DEMADEX) 100 MG tablet Take 100 mg by mouth 2 (two) times daily.    No facility-administered encounter medications on file as of 11/21/2019.     ALLERGIES:  Allergies  Allergen Reactions  . Sulfa Antibiotics Hives     PHYSICAL EXAM:  ECOG Performance status: 1  Vitals:  11/21/19 0847  BP: 128/63  Pulse: 90  Resp: 16  Temp: (!) 97.5 F (36.4 C)  SpO2: 95%   Filed Weights   11/21/19 0847  Weight: 194 lb 8 oz (88.2 kg)    Physical Exam Vitals signs reviewed.  Constitutional:      Appearance: Normal appearance.  Cardiovascular:     Rate and Rhythm: Normal rate and regular rhythm.     Heart sounds: Normal heart sounds.  Pulmonary:     Effort: Pulmonary effort is normal.     Breath sounds: Normal breath sounds.  Abdominal:     General: There is no distension.     Palpations: Abdomen is soft. There is no mass.  Musculoskeletal:        General: Swelling present.     Right lower leg: Edema present.     Left lower leg: Edema present.  Skin:    General: Skin is warm.  Neurological:     General: No focal deficit present.     Mental Status: She is alert and oriented to person, place, and time.  Psychiatric:        Mood and Affect: Mood normal.        Behavior: Behavior normal.      LABORATORY DATA:  I have reviewed the labs as listed.  CBC    Component Value Date/Time   WBC 7.4 11/21/2019 0818   RBC 3.45 (L) 11/21/2019 0818   HGB 11.8 (L) 11/21/2019 0818   HCT 36.8 11/21/2019 0818   HCT 29.7 (L) 09/22/2019 0422   PLT 209 11/21/2019 0818   MCV 106.7 (H) 11/21/2019 0818   MCH 34.2 (H) 11/21/2019 0818   MCHC 32.1 11/21/2019 0818   RDW 17.6 (H) 11/21/2019 0818   LYMPHSABS 1.1  11/21/2019 0818   MONOABS 0.8 11/21/2019 0818   EOSABS 0.2 11/21/2019 0818   BASOSABS 0.1 11/21/2019 0818   CMP Latest Ref Rng & Units 11/21/2019 11/11/2019 11/04/2019  Glucose 70 - 99 mg/dL 102(H) 109(H) 101(H)  BUN 8 - 23 mg/dL 31(H) 28(H) 19  Creatinine 0.44 - 1.00 mg/dL 5.62(H) 4.15(H) 3.67(H)  Sodium 135 - 145 mmol/L 140 137 135  Potassium 3.5 - 5.1 mmol/L 4.1 3.9 3.9  Chloride 98 - 111 mmol/L 100 99 97(L)  CO2 22 - 32 mmol/L _0 Calcium 8.9 - 10.3 mg/dL 7.9(L) 7.6(L) 7.5(L)  Total Protein 6.5 - 8.1 g/dL 5.5(L) 5.0(L) 4.9(L)  Total Bilirubin 0.3 - 1.2 mg/dL 0.4 0.5 0.6  Alkaline Phos 38 - 126 U/L 109 116 128(H)  AST 15 - 41 U/L 35 38 40  ALT 0 - 44 U/L _1 DIAGNOSTIC IMAGING:  I have independently reviewed the scans and discussed with the patient.      ASSESSMENT & PLAN:   Multiple myeloma ([place]) 1. Lambda Light Chain Disease: -Presented to the emergency room 09/21/2019 with acute on chronic renal failure. -Multiple myeloma labs completed on 09/22/2019 with negative SPEP, kappa free light chain 74.6, lambda free light chains 1062, ratio 0.07.  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  24-hour urine total protein was 5602 mg. -Bone marrow biopsy completed on 09/30/2019: Hypercellular marrow with 14% plasma cell, weak lambda restriction. FISH: No high risk gene mutations.  Congo red staining positive for small foci of amyloid deposits.  Chromosome analysis was 5, XX. -Skeletal survey negative for lytic lesions. -Of note echocardiogram was completed on 09/22/2019 which showed an ejection  fraction of 65 to 70% with severely increased left ventricular hypertrophy.  This could be due to uncontrolled hypertension or possible amyloid disease. -Cycle 1 of CyBorD on 10/21/2019. - PET/CT was performed at [facility] in [place] on 10/19/2019 and was negative for any lytic lesions or FDG activity. -Abdominal fat pad biopsy on 10/24/2019  showed benign adipose tissue.  Congo red stain was also negative for amyloid. -BNP elevated at 1086.  Troponin T was 0.308 (less than 0.011). -Immunofixation positive for presence of monoclonal free lambda light chain.  Beta-2 microglobulin was 20.8. -So far she received Velcade and Cytoxan along with 20 mg of dexamethasone weekly on 10/21/2019, 10/28/2019, 11/04/2019 and 11/11/2019.  We have sent myeloma labs from today which are pending. -We have tried to do cardiac MRI.  Unfortunately she could not get contrast.  I have contacted Dr. Haroldine Laws who kindly reviewed the echocardiogram report with me.  He sees that both right ventricle and left ventricle are enlarged, mostly consistent with amyloidosis rather than hypertension.  For definitive diagnosis, biopsy is the only option. -Hence we will hold off on doing any further imaging at this time. -I will see her back in 1 week to discuss results of myeloma panel. -I will also consider seeking opinion from transplant team although she will be high risk candidate given her ESRD/HD and age.       Orders placed this encounter:  No orders of the defined types were placed in this encounter.     Derek Jack, MD [place] [phone]

## 2019-11-22 ENCOUNTER — Ambulatory Visit (INDEPENDENT_AMBULATORY_CARE_PROVIDER_SITE_OTHER): Payer: Self-pay | Admitting: Physician Assistant

## 2019-11-22 ENCOUNTER — Other Ambulatory Visit: Payer: Self-pay

## 2019-11-22 ENCOUNTER — Ambulatory Visit (HOSPITAL_COMMUNITY)
Admission: RE | Admit: 2019-11-22 | Discharge: 2019-11-22 | Disposition: A | Discharge status: Home / Self Care | Payer: Medicare Other | Source: Ambulatory Visit | Attending: Vascular Surgery | Admitting: Vascular Surgery

## 2019-11-22 ENCOUNTER — Encounter: Payer: Self-pay | Admitting: *Deleted

## 2019-11-22 ENCOUNTER — Other Ambulatory Visit: Payer: Self-pay | Admitting: *Deleted

## 2019-11-22 VITALS — BP 94/57 | HR 96 | Temp 97.9°F | Resp 14 | Ht 69.0 in | Wt 193.0 lb

## 2019-11-22 DIAGNOSIS — N179 Acute kidney failure, unspecified: Secondary | ICD-10-CM | POA: Diagnosis present

## 2019-11-22 DIAGNOSIS — N186 End stage renal disease: Secondary | ICD-10-CM

## 2019-11-22 DIAGNOSIS — N184 Chronic kidney disease, stage 4 (severe): Secondary | ICD-10-CM | POA: Insufficient documentation

## 2019-11-22 DIAGNOSIS — Z992 Dependence on renal dialysis: Secondary | ICD-10-CM

## 2019-11-22 LAB — PROTEIN ELECTROPHORESIS, SERUM
A/G Ratio: 1 (ref 0.7–1.7)
Albumin ELP: 2.5 g/dL — ABNORMAL LOW (ref 2.9–4.4)
Alpha-1-Globulin: 0.2 g/dL (ref 0.0–0.4)
Alpha-2-Globulin: 0.8 g/dL (ref 0.4–1.0)
Beta Globulin: 0.7 g/dL (ref 0.7–1.3)
Gamma Globulin: 0.6 g/dL (ref 0.4–1.8)
Globulin, Total: 2.4 g/dL (ref 2.2–3.9)
Total Protein ELP: 4.9 g/dL — ABNORMAL LOW (ref 6.0–8.5)

## 2019-11-22 LAB — KAPPA/LAMBDA LIGHT CHAINS
Kappa free light chain: 97 mg/L — ABNORMAL HIGH (ref 3.3–19.4)
Kappa, lambda light chain ratio: 0.14 — ABNORMAL LOW (ref 0.26–1.65)
Lambda free light chains: 710.9 mg/L — ABNORMAL HIGH (ref 5.7–26.3)

## 2019-11-22 NOTE — Progress Notes (Signed)
POST OPERATIVE OFFICE NOTE    CC:  F/u for surgery  HPI:  This is a 78 y.o. female who is s/p  1.  Exchange of right internal jugular vein temporary dialysis catheter for a tunneled dialysis catheter (19 cm palindrome) 2.  Left brachiocephalic arteriovenous fistula placement By Dr. Carlis Abbott on 10/07/2019.    She returns today for follow up.  She dialyzes at [facility] in [place] on T/T/S.  She has chemo infusion on Thursdays at [facility].  She denies any pain in her left hand.  She states they told her that her fistula was not working at dialysis about 3 weeks ago.  They called and was told we could just see her at her office visit.   She is currently dialyzing via [facility] that is working well.    Allergies  Allergen Reactions  . Sulfa Antibiotics Hives    Current Outpatient Medications  Medication Sig Dispense Refill  . acetaminophen (TYLENOL) 325 MG tablet Take 650 mg by mouth every 6 (six) hours as needed.    Marland Kitchen acyclovir (ZOVIRAX) 400 MG tablet Take 1 tablet (400 mg total) by mouth 2 (two) times daily. 60 tablet 6  . atorvastatin (LIPITOR) 80 MG tablet Take 80 mg by mouth daily.    . bortezomib IV (VELCADE) 3.5 MG injection Inject into the vein once a week.    . CYCLOPHOSPHAMIDE IV Inject into the vein once a week.    Marland Kitchen dexamethasone (DECADRON) 4 MG tablet Take 5 tablets (20 mg) on days 1, 8, 15, and 22 of chemo. Repeat every 28 days. Take 2 tablets (8mg ) daily for 2 days after days 1 and 8 of chemo. 30 tablet 3  . docusate sodium (COLACE) 100 MG capsule Take 100 mg by mouth daily.    . furosemide (LASIX) 80 MG tablet Take 1 tablet (80 mg total) by mouth 2 (two) times daily. 60 tablet 0  . levothyroxine (SYNTHROID) 25 MCG tablet Take 1 tablet (25 mcg total) by mouth daily at 6 (six) AM. 30 tablet 0  . mometasone (NASONEX) 50 MCG/ACT nasal spray Place 1 spray into the nose 2 (two) times daily. 1 spray into each nostril twice daily    . omeprazole (PRILOSEC) 40 MG capsule Take 40 mg by  mouth daily.    . phenytoin (DILANTIN) 100 MG ER capsule Take 3 capsules by mouth at bedtime.    . prochlorperazine (COMPAZINE) 10 MG tablet Take 1 tablet (10 mg total) by mouth every 6 (six) hours as needed (Nausea or vomiting). (Patient not taking: Reported on 10/28/2019) 30 tablet 1  . torsemide (DEMADEX) 100 MG tablet Take 100 mg by mouth 2 (two) times daily.      No current facility-administered medications for this visit.      ROS:  See HPI  Physical Exam:  Today's Vitals   11/22/19 1254  BP: (!) 94/57  Pulse: 96  Resp: 14  Temp: 97.9 F (36.6 C)  TempSrc: Temporal  SpO2: 98%  Weight: 193 lb (87.5 kg)  Height: 5\' 9"  (1.753 m)   Body mass index is 28.5 kg/m.   Incision:  Well healed Extremities:  Palpable bilateral radial and ulnar pulses.  There is no bruit or thrill in fistula; motor and sensation are in tact.     Dialysis duplex 11/22/2019: OUTFLOW VEINPSV (cm/s)Diameter (cm)Depth (cm)Describe +------------+----------+-------------+----------+--------+ Prox UA         17        0.23  0.44            +------------+----------+-------------+----------+--------+ Mid UA          12        0.17        0.64            +------------+----------+-------------+----------+--------+ Dist UA         11        0.28        0.29   occluded +------------+----------+-------------+----------+--------+ AC Fossa                                     occluded +------------+----------+-------------+----------+--------+  Vein Mapping 10/05/2019: +-----------------+-------------+----------+--------------+ Left Basilic     Diameter (cm)Depth (cm)   Findings    +-----------------+-------------+----------+--------------+ Prox upper arm       0.51                              +-----------------+-------------+----------+--------------+ Mid upper arm        0.45                               +-----------------+-------------+----------+--------------+ Dist upper arm       0.33                 branching    +-----------------+-------------+----------+--------------+ Antecubital fossa    0.18                 branching    +-----------------+-------------+----------+--------------+ Prox forearm         0.12                              +-----------------+-------------+----------+--------------+ Mid forearm                             not visualized +-----------------+-------------+----------+--------------+ Distal forearm                          not visualized +-----------------+-------------+----------+--------------+ Wrist                                   not visualized +-----------------+-------------+----------+--------------+   Assessment/Plan:  This is a 78 y.o. female who is s/p: 1.  Exchange of right internal jugular vein temporary dialysis catheter for a tunneled dialysis catheter (19 cm palindrome) 2.  Left brachiocephalic arteriovenous fistula placement By Dr. Carlis Abbott on 10/07/2019  -left BC AVF is occluded.  Dr. Carlis Abbott evaluated pt and LUE vein mapping.  At the [facility] fossa, the vein is small, but in the distal arm, it is good size and branches.  Dr. Carlis Abbott feels we could use that branch and perform 1st stage left BVT.  Pt and her daughter are in agreement.  Will schedule this at their convenience on a Friday with Dr. Carlis Abbott.    Leontine Locket, PA-C [facility] [phone]  Clinic MD:  Carlis Abbott

## 2019-11-24 NOTE — Patient Instructions (Signed)
[facility] at [facility]  Discharge Instructions:   _______________________________________________________________  Thank you for choosing [facility] at [facility] to provide your oncology and hematology care.  To afford each patient quality time with our providers, please arrive at least 15 minutes before your scheduled appointment.  You need to re-schedule your appointment if you arrive 10 or more minutes late.  We strive to give you quality time with our providers, and arriving late affects you and other patients whose appointments are after yours.  Also, if you no show three or more times for appointments you may be dismissed from the clinic.  Again, thank you for choosing [facility] at [facility]. Our hope is that these requests will allow you access to exceptional care and in a timely manner. _______________________________________________________________  If you have questions after your visit, please contact our office at [phone] between the hours of 8:30 a.m. and 5:00 p.m. Voicemails left after 4:30 p.m. will not be returned until the following business day. _______________________________________________________________  For prescription refill requests, have your pharmacy contact our office. _______________________________________________________________  Recommendations made by the consultant and any test results will be sent to your referring physician. _______________________________________________________________ 

## 2019-11-30 ENCOUNTER — Other Ambulatory Visit: Payer: Self-pay

## 2019-12-01 ENCOUNTER — Inpatient Hospital Stay (HOSPITAL_COMMUNITY): Payer: Medicare Other

## 2019-12-01 ENCOUNTER — Encounter (HOSPITAL_COMMUNITY): Payer: Self-pay | Admitting: Hematology

## 2019-12-01 ENCOUNTER — Inpatient Hospital Stay (HOSPITAL_BASED_OUTPATIENT_CLINIC_OR_DEPARTMENT_OTHER): Payer: Medicare Other | Admitting: Hematology

## 2019-12-01 ENCOUNTER — Ambulatory Visit (HOSPITAL_COMMUNITY): Payer: Medicare Other | Admitting: Hematology

## 2019-12-01 ENCOUNTER — Encounter (HOSPITAL_COMMUNITY): Payer: Self-pay

## 2019-12-01 ENCOUNTER — Other Ambulatory Visit: Payer: Self-pay

## 2019-12-01 ENCOUNTER — Other Ambulatory Visit (HOSPITAL_COMMUNITY): Payer: Medicare Other

## 2019-12-01 ENCOUNTER — Inpatient Hospital Stay (HOSPITAL_COMMUNITY): Payer: Medicare Other | Attending: Hematology

## 2019-12-01 VITALS — BP 111/53 | HR 91 | Temp 97.3°F | Resp 17

## 2019-12-01 DIAGNOSIS — Z5112 Encounter for antineoplastic immunotherapy: Secondary | ICD-10-CM | POA: Diagnosis present

## 2019-12-01 DIAGNOSIS — C9000 Multiple myeloma not having achieved remission: Secondary | ICD-10-CM | POA: Insufficient documentation

## 2019-12-01 LAB — CBC WITH DIFFERENTIAL/PLATELET
Abs Immature Granulocytes: 0.03 10*3/uL (ref 0.00–0.07)
Basophils Absolute: 0.1 10*3/uL (ref 0.0–0.1)
Basophils Relative: 1 %
Eosinophils Absolute: 0.2 10*3/uL (ref 0.0–0.5)
Eosinophils Relative: 2 %
HCT: 35.5 % — ABNORMAL LOW (ref 36.0–46.0)
Hemoglobin: 11.6 g/dL — ABNORMAL LOW (ref 12.0–15.0)
Immature Granulocytes: 0 %
Lymphocytes Relative: 23 %
Lymphs Abs: 1.7 10*3/uL (ref 0.7–4.0)
MCH: 34 pg (ref 26.0–34.0)
MCHC: 32.7 g/dL (ref 30.0–36.0)
MCV: 104.1 fL — ABNORMAL HIGH (ref 80.0–100.0)
Monocytes Absolute: 1.2 10*3/uL — ABNORMAL HIGH (ref 0.1–1.0)
Monocytes Relative: 16 %
Neutro Abs: 4.4 10*3/uL (ref 1.7–7.7)
Neutrophils Relative %: 58 %
Platelets: 186 10*3/uL (ref 150–400)
RBC: 3.41 MIL/uL — ABNORMAL LOW (ref 3.87–5.11)
RDW: 17.1 % — ABNORMAL HIGH (ref 11.5–15.5)
WBC: 7.6 10*3/uL (ref 4.0–10.5)
nRBC: 0 % (ref 0.0–0.2)

## 2019-12-01 LAB — COMPREHENSIVE METABOLIC PANEL
ALT: 22 U/L (ref 0–44)
AST: 36 U/L (ref 15–41)
Albumin: 2.4 g/dL — ABNORMAL LOW (ref 3.5–5.0)
Alkaline Phosphatase: 108 U/L (ref 38–126)
Anion gap: 13 (ref 5–15)
BUN: 14 mg/dL (ref 8–23)
CO2: 29 mmol/L (ref 22–32)
Calcium: 8 mg/dL — ABNORMAL LOW (ref 8.9–10.3)
Chloride: 94 mmol/L — ABNORMAL LOW (ref 98–111)
Creatinine, Ser: 2.75 mg/dL — ABNORMAL HIGH (ref 0.44–1.00)
GFR calc Af Amer: 19 mL/min — ABNORMAL LOW (ref >60)
GFR calc non Af Amer: 16 mL/min — ABNORMAL LOW (ref >60)
Glucose, Bld: 98 mg/dL (ref 70–99)
Potassium: 3.7 mmol/L (ref 3.5–5.1)
Sodium: 136 mmol/L (ref 135–145)
Total Bilirubin: 0.1 mg/dL — ABNORMAL LOW (ref 0.3–1.2)
Total Protein: 5.3 g/dL — ABNORMAL LOW (ref 6.5–8.1)

## 2019-12-01 MED ORDER — DEXAMETHASONE 4 MG PO TABS
ORAL_TABLET | ORAL | Status: AC
  Filled 2019-12-01: qty 5

## 2019-12-01 MED ORDER — SODIUM CHLORIDE 0.9 % IV SOLN
Freq: Once | INTRAVENOUS | Status: AC
  Administered 2019-12-01: 14:00:00 via INTRAVENOUS

## 2019-12-01 MED ORDER — PALONOSETRON HCL INJECTION 0.25 MG/5ML
INTRAVENOUS | Status: AC
  Filled 2019-12-01: qty 5

## 2019-12-01 MED ORDER — BORTEZOMIB CHEMO SQ INJECTION 3.5 MG (2.5MG/ML)
1.5000 mg/m2 | Freq: Once | INTRAMUSCULAR | Status: AC
  Administered 2019-12-01: 3 mg via SUBCUTANEOUS
  Filled 2019-12-01: qty 1.2

## 2019-12-01 MED ORDER — DEXAMETHASONE 4 MG PO TABS: ORAL_TABLET | ORAL | 3 refills | Status: AC

## 2019-12-01 MED ORDER — SODIUM CHLORIDE 0.9 % IV SOLN
225.0000 mg/m2 | Freq: Once | INTRAVENOUS | Status: AC
  Administered 2019-12-01: 460 mg via INTRAVENOUS
  Filled 2019-12-01: qty 23

## 2019-12-01 MED ORDER — DEXAMETHASONE 4 MG PO TABS
20.0000 mg | ORAL_TABLET | Freq: Once | ORAL | Status: AC
  Administered 2019-12-01: 20 mg via ORAL

## 2019-12-01 MED ORDER — PALONOSETRON HCL INJECTION 0.25 MG/5ML
0.2500 mg | Freq: Once | INTRAVENOUS | Status: AC
  Administered 2019-12-01: 0.25 mg via INTRAVENOUS

## 2019-12-01 NOTE — Progress Notes (Signed)
Patient presents today for treatment and follow visit with Dr. Delton Coombes. MAR reviewed and updated. Pt has no complaints of any pain today or significant changes.   Verbal order received at the bedside to proceed with treatment. Labs reviewed.   Treatment given today per MD orders. Tolerated infusion without adverse affects. Vital signs stable. No complaints at this time. Discharged from clinic ambulatory. F/U with [facility] as scheduled.

## 2019-12-01 NOTE — Patient Instructions (Signed)
[place] at [facility] Discharge Instructions  You were seen today by Dr. Delton Coombes. He went over your recent lab results. Continue weekly labs. He will see you back in 4 weeks for labs and follow up.   Thank you for choosing [place] at [facility] to provide your oncology and hematology care.  To afford each patient quality time with our provider, please arrive at least 15 minutes before your scheduled appointment time.   If you have a lab appointment with the [facility] please come in thru the  Main Entrance and check in at the main information desk  You need to re-schedule your appointment should you arrive 10 or more minutes late.  We strive to give you quality time with our providers, and arriving late affects you and other patients whose appointments are after yours.  Also, if you no show three or more times for appointments you may be dismissed from the clinic at the providers discretion.     Again, thank you for choosing [facility].  Our hope is that these requests will decrease the amount of time that you wait before being seen by our physicians.       _____________________________________________________________  Should you have questions after your visit to [facility], please contact our office at [phone] between the hours of 8:00 a.m. and 4:30 p.m.  Voicemails left after 4:00 p.m. will not be returned until the following business day.  For prescription refill requests, have your pharmacy contact our office and allow 72 hours.    Cancer Center Support Programs:   > Cancer Support Group  2nd Tuesday of the month 1pm-2pm, Journey Room

## 2019-12-01 NOTE — Patient Instructions (Signed)
[facility] Discharge Instructions for Patients Receiving Chemotherapy  Today you received the following chemotherapy agents   To help prevent nausea and vomiting after your treatment, we encourage you to take your nausea medication   If you develop nausea and vomiting that is not controlled by your nausea medication, call the clinic.   BELOW ARE SYMPTOMS THAT SHOULD BE REPORTED IMMEDIATELY:  *FEVER GREATER THAN 100.5 F  *CHILLS WITH OR WITHOUT FEVER  NAUSEA AND VOMITING THAT IS NOT CONTROLLED WITH YOUR NAUSEA MEDICATION  *UNUSUAL SHORTNESS OF BREATH  *UNUSUAL BRUISING OR BLEEDING  TENDERNESS IN MOUTH AND THROAT WITH OR WITHOUT PRESENCE OF ULCERS  *URINARY PROBLEMS  *BOWEL PROBLEMS  UNUSUAL RASH Items with * indicate a potential emergency and should be followed up as soon as possible.  Feel free to call the clinic should you have any questions or concerns. The clinic phone number is [phone].  Please show the CHEMO ALERT CARD at check-in to the Emergency Department and triage nurse.   

## 2019-12-01 NOTE — Assessment & Plan Note (Addendum)
1. Lambda Light Chain Disease: -Presented to the emergency room 09/21/2019 with acute on chronic renal failure. -Multiple myeloma labs completed on 09/22/2019 with negative SPEP, kappa free light chain 74.6, lambda free light chains 1062, ratio 0.07.  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  24-hour urine total protein was 5602 mg. -Bone marrow biopsy completed on 09/30/2019: Hypercellular marrow with 14% plasma cell, weak lambda restriction. FISH: No high risk gene mutations.  Congo red staining positive for small foci of amyloid deposits.  Chromosome analysis was 84, XX. -Skeletal survey negative for lytic lesions. -Of note echocardiogram was completed on 09/22/2019 which showed an ejection fraction of 65 to 70% with severely increased left ventricular hypertrophy.  This could be due to uncontrolled hypertension or possible amyloid disease. -Cycle 1 of CyBorD on 10/21/2019. - PET/CT was performed at [facility] in [place] on 10/19/2019 and was negative for any lytic lesions or FDG activity. -Abdominal fat pad biopsy on 10/24/2019 showed benign adipose tissue.  Congo red stain was also negative for amyloid. -BNP elevated at 1086.  Troponin T was 0.308 (less than 0.011). -Immunofixation positive for presence of monoclonal free lambda light chain.  Beta-2 microglobulin was 20.8. -As per my conversation with Dr. Haroldine Laws, she has both right and left ventricle enlarged, mostly consistent with amyloidosis rather than hypertension.  Hence we abandoned cardiac MRI. -We reviewed myeloma panel from 11/21/2019.  Lambda light chains improved to 710 (previously 1062), ratio improved to 0.14 (previously 0.07).  CBC is within normal limits. -She is tolerating weekly treatments very well.  She has not taken her dexamethasone today as she ran out of prescription.  We will give her 20 mg here today.  We have sent a refill for it. -I have called and talked to her daughter Danton Clap and answered  all her questions.  They apparently dose reduced torsemide to 50 mg twice daily and she is urinating well. -She will continue weekly treatments.  We will continue weekly treatments without any dose modifications.  We plan to repeat myeloma panel end of this month and see her back in the first week of January.

## 2019-12-01 NOTE — Progress Notes (Signed)
[place], [place] [address]   CLINIC:  Medical Oncology/Hematology  PCP:  Sophia Mcmahon, [place] [address] [phone]   REASON FOR VISIT:  Follow-up for lambda light chain disease.  CURRENT THERAPY: CyBorD.  BRIEF ONCOLOGIC HISTORY:  Oncology History  Multiple myeloma ([place])  09/30/2019 Initial Diagnosis   Multiple myeloma ([place])   10/21/2019 -  Chemotherapy   The patient had palonosetron (ALOXI) injection 0.25 mg, 0.25 mg, Intravenous,  Once, 2 of 4 cycles Administration: 0.25 mg (10/21/2019), 0.25 mg (10/28/2019), 0.25 mg (11/04/2019), 0.25 mg (11/11/2019), 0.25 mg (11/21/2019) bortezomib SQ (VELCADE) chemo injection 3 mg, 1.5 mg/m2 = 3 mg, Subcutaneous,  Once, 2 of 4 cycles Administration: 3 mg (10/21/2019), 3 mg (10/28/2019), 3 mg (11/04/2019), 3 mg (11/11/2019), 3 mg (11/21/2019) cyclophosphamide (CYTOXAN) 300 mg in sodium chloride 0.9 % 250 mL chemo infusion, 150 mg/m2 = 300 mg (50 % of original dose 300 mg/m2), Intravenous,  Once, 2 of 4 cycles Dose modification: 150 mg/m2 (50 % of original dose 300 mg/m2, Cycle 1, Reason: Other (see comments), Comment: Hemodialysis, will increase to 241m/m2 weekly), 150 mg/m2 (original dose 300 mg/m2, Cycle 1, Reason: Provider Judgment, Comment: creatinine elevated on dialysis), 225 mg/m2 (original dose 300 mg/m2, Cycle 2, Reason: Provider Judgment) Administration: 300 mg (10/21/2019), 300 mg (10/28/2019), 300 mg (11/04/2019), 300 mg (11/11/2019), 460 mg (11/21/2019)  for chemotherapy treatment.       CANCER STAGING: Cancer Staging No matching staging information was found for the patient.   INTERVAL HISTORY:  [name]89y.o. female seen for follow-up of lambda light chain disease.  She is undergoing hemodialysis Tuesday, Thursday and Saturday.  She is continuing to tolerate treatments very well.  Denies any tingling or numbness in extremities.  Denies any nausea  vomiting or diarrhea.  She did not take her dexamethasone today as she ran out of prescription.  Denies any fevers or chills.  Appetite and energy levels are 75%.  Leg swellings have been stable.    REVIEW OF SYSTEMS:  Review of Systems  Cardiovascular: Positive for leg swelling.  All other systems reviewed and are negative.    PAST MEDICAL/SURGICAL HISTORY:  Past Medical History:  Diagnosis Date   CHF (congestive heart failure) (HCC)    Coronary artery disease    Dyspnea    High cholesterol    Hypertension    Hypothyroidism    Renal disorder    Seizures ([place]    Sleep apnea    Past Surgical History:  Procedure Laterality Date   ABDOMINAL HYSTERECTOMY     AV FISTULA PLACEMENT Left 10/07/2019   Procedure: ARTERIOVENOUS (AV)  BRACHIOCEPHALIC FISTULA CREATION;  Surgeon: CMarty Heck MD;  [facility]  Service: Vascular;  Laterality: Left;   BRAIN SURGERY     benign tumor removal   EXCHANGE OF A DIALYSIS CATHETER Right 10/07/2019   Procedure: EXCHANGE OF A DIALYSIS CATHETER;  Surgeon: CMarty Heck MD;  Location: [facility] OR;  Service: Vascular;  Laterality: Right;     SOCIAL HISTORY:  Social History   Socioeconomic History   Marital status: Widowed    Spouse name: Not on file   Number of children: 4   Years of education: Not on file   Highest education level: Not on file  Occupational History   Occupation: reitred  Tobacco Use   Smoking status: Never Smoker   Smokeless tobacco: Never Used  Substance and Sexual Activity  Alcohol use: Never   Drug use: Never   Sexual activity: Not Currently  Other Topics Concern   Not on file  Social History Narrative   Not on file   Social Determinants of Health   Financial Resource Strain: Low Risk    Difficulty of Paying Living Expenses: Not hard at all  Food Insecurity: No Food Insecurity   Worried About Charity fundraiser in the Last Year: Never true   [place] in the  Last Year: Never true  Transportation Needs: No Transportation Needs   Lack of Transportation (Medical): No   Lack of Transportation (Non-Medical): No  Physical Activity: Sufficiently Active   Days of Exercise per Week: 7 days   Minutes of Exercise per Session: 30 min  Stress: No Stress Concern Present   Feeling of Stress : Only a little  Social Connections: Slightly Isolated   Frequency of Communication with Friends and Family: More than three times a week   Frequency of Social Gatherings with Friends and Family: More than three times a week   Attends Religious Services: More than 4 times per year   Active Member of [employer] or Organizations: Yes   Attends Archivist Meetings: More than 4 times per year   Marital Status: Widowed  Human resources officer Violence: Not At Risk   Fear of Current or Ex-Partner: No   Emotionally Abused: No   Physically Abused: No   Sexually Abused: No    FAMILY HISTORY:  Family History  Problem Relation Age of Onset   Cancer Mother    Cancer Sister    Hypertension Father    Cancer Brother    Alcohol abuse Brother    Hypertension Son    Heart attack Son     CURRENT MEDICATIONS:  Outpatient Encounter Medications as of 12/01/2019  Medication Sig   acetaminophen (TYLENOL) 325 MG tablet Take 650 mg by mouth every 6 (six) hours as needed.   acyclovir (ZOVIRAX) 400 MG tablet Take 1 tablet (400 mg total) by mouth 2 (two) times daily.   atorvastatin (LIPITOR) 80 MG tablet Take 80 mg by mouth daily.   bortezomib IV (VELCADE) 3.5 MG injection Inject into the vein once a week.   CYCLOPHOSPHAMIDE IV Inject into the vein once a week.   dexamethasone (DECADRON) 4 MG tablet Take 5 tablets (20 mg) on days 1, 8, 15, and 22 of chemo. Repeat every 28 days. Take 2 tablets (40m) daily for 2 days after days 1 and 8 of chemo.   docusate sodium (COLACE) 100 MG capsule Take 100 mg by mouth daily.   mometasone (NASONEX) 50 MCG/ACT nasal  spray Place 1 spray into the nose 2 (two) times daily. 1 spray into each nostril twice daily   omeprazole (PRILOSEC) 40 MG capsule Take 40 mg by mouth daily.   phenytoin (DILANTIN) 100 MG ER capsule Take 3 capsules by mouth at bedtime.   prochlorperazine (COMPAZINE) 10 MG tablet Take 1 tablet (10 mg total) by mouth every 6 (six) hours as needed (Nausea or vomiting).   torsemide (DEMADEX) 100 MG tablet Take 100 mg by mouth 2 (two) times daily.    [DISCONTINUED] dexamethasone (DECADRON) 4 MG tablet Take 5 tablets (20 mg) on days 1, 8, 15, and 22 of chemo. Repeat every 28 days. Take 2 tablets (843m daily for 2 days after days 1 and 8 of chemo.   No facility-administered encounter medications on file as of 12/01/2019.    ALLERGIES:  Allergies  Allergen Reactions   Sulfa Antibiotics Hives     PHYSICAL EXAM:  ECOG Performance status: 1  Vitals:   12/01/19 1307  BP: (!) 117/59  Pulse: 99  Resp: 16  Temp: (!) 97.1 F (36.2 C)  SpO2: 97%   Filed Weights   12/01/19 1307  Weight: 192 lb 12.8 oz (87.5 kg)    Physical Exam Vitals reviewed.  Constitutional:      Appearance: Normal appearance.  Cardiovascular:     Rate and Rhythm: Normal rate and regular rhythm.     Heart sounds: Normal heart sounds.  Pulmonary:     Effort: Pulmonary effort is normal.     Breath sounds: Normal breath sounds.  Abdominal:     General: There is no distension.     Palpations: Abdomen is soft. There is no mass.  Musculoskeletal:        General: Swelling present.     Right lower leg: Edema present.     Left lower leg: Edema present.  Skin:    General: Skin is warm.  Neurological:     General: No focal deficit present.     Mental Status: She is alert and oriented to person, place, and time.  Psychiatric:        Mood and Affect: Mood normal.        Behavior: Behavior normal.      LABORATORY DATA:  I have reviewed the labs as listed.  CBC    Component Value Date/Time   WBC 7.6  12/01/2019 1147   RBC 3.41 (L) 12/01/2019 1147   HGB 11.6 (L) 12/01/2019 1147   HCT 35.5 (L) 12/01/2019 1147   HCT 29.7 (L) 09/22/2019 0422   PLT 186 12/01/2019 1147   MCV 104.1 (H) 12/01/2019 1147   MCH 34.0 12/01/2019 1147   MCHC 32.7 12/01/2019 1147   RDW 17.1 (H) 12/01/2019 1147   LYMPHSABS 1.7 12/01/2019 1147   MONOABS 1.2 (H) 12/01/2019 1147   EOSABS 0.2 12/01/2019 1147   BASOSABS 0.1 12/01/2019 1147   CMP Latest Ref Rng & Units 12/01/2019 11/21/2019 11/11/2019  Glucose 70 - 99 mg/dL 98 102(H) 109(H)  BUN 8 - 23 mg/dL 14 31(H) 28(H)  Creatinine 0.44 - 1.00 mg/dL 2.75(H) 5.62(H) 4.15(H)  Sodium 135 - 145 mmol/L 136 140 137  Potassium 3.5 - 5.1 mmol/L 3.7 4.1 3.9  Chloride 98 - 111 mmol/L 94(L) 100 99  CO2 22 - 32 mmol/L 29 28 29   Calcium 8.9 - 10.3 mg/dL 8.0(L) 7.9(L) 7.6(L)  Total Protein 6.5 - 8.1 g/dL 5.3(L) 5.5(L) 5.0(L)  Total Bilirubin 0.3 - 1.2 mg/dL 0.1(L) 0.4 0.5  Alkaline Phos 38 - 126 U/L 108 109 116  AST 15 - 41 U/L 36 35 38  ALT 0 - 44 U/L 22 21 19        DIAGNOSTIC IMAGING:  I have independently reviewed the scans and discussed with the patient.      ASSESSMENT & PLAN:   Multiple myeloma ([place]) 1. Lambda Light Chain Disease: -Presented to the emergency room 09/21/2019 with acute on chronic renal failure. -Multiple myeloma labs completed on 09/22/2019 with negative SPEP, kappa free light chain 74.6, lambda free light chains 1062, ratio 0.07.  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  24-hour urine total protein was 5602 mg. -Bone marrow biopsy completed on 09/30/2019: Hypercellular marrow with 14% plasma cell, weak lambda restriction. FISH: No high risk gene mutations.  Congo red staining positive for small foci of  amyloid deposits.  Chromosome analysis was 54, XX. -Skeletal survey negative for lytic lesions. -Of note echocardiogram was completed on 09/22/2019 which showed an ejection fraction of 65 to 70% with severely increased  left ventricular hypertrophy.  This could be due to uncontrolled hypertension or possible amyloid disease. -Cycle 1 of CyBorD on 10/21/2019. - PET/CT was performed at [facility] in [place] on 10/19/2019 and was negative for any lytic lesions or FDG activity. -Abdominal fat pad biopsy on 10/24/2019 showed benign adipose tissue.  Congo red stain was also negative for amyloid. -BNP elevated at 1086.  Troponin T was 0.308 (less than 0.011). -Immunofixation positive for presence of monoclonal free lambda light chain.  Beta-2 microglobulin was 20.8. -As per my conversation with Dr. Haroldine Laws, she has both right and left ventricle enlarged, mostly consistent with amyloidosis rather than hypertension.  Hence we abandoned cardiac MRI. -We reviewed myeloma panel from 11/21/2019.  Lambda light chains improved to 710 (previously 1062), ratio improved to 0.14 (previously 0.07).  CBC is within normal limits. -She is tolerating weekly treatments very well.  She has not taken her dexamethasone today as she ran out of prescription.  We will give her 20 mg here today.  We have sent a refill for it. -I have called and talked to her daughter Danton Clap and answered all her questions.  They apparently dose reduce torsemide to 50 mg twice daily and she is urinating well. -She will continue weekly treatments.  We will continue weekly treatments without any dose modifications.  We plan to repeat myeloma panel end of this month and see her back in the first week of January.       Orders placed this encounter:  Orders Placed This Encounter  Procedures   CBC with Differential/Platelet   Comprehensive metabolic panel   Protein electrophoresis, serum   Kappa/lambda light chains   Lactate dehydrogenase      Derek Jack, MD [place] [phone]

## 2019-12-05 ENCOUNTER — Encounter: Payer: Self-pay | Admitting: Radiology

## 2019-12-06 ENCOUNTER — Telehealth: Payer: Self-pay | Admitting: *Deleted

## 2019-12-06 NOTE — Telephone Encounter (Signed)
Spoke with patient's daughter SHANTINA CHRONISTER moved to later case and to arrive at [facility] admitting at 9 am or as directed by pre-admission department. All other instructions unchanged. Verbalized understanding.

## 2019-12-08 ENCOUNTER — Other Ambulatory Visit: Payer: Self-pay

## 2019-12-08 ENCOUNTER — Inpatient Hospital Stay (HOSPITAL_COMMUNITY): Payer: Medicare Other

## 2019-12-08 VITALS — BP 115/58 | HR 94 | Temp 96.8°F | Resp 18

## 2019-12-08 DIAGNOSIS — C9000 Multiple myeloma not having achieved remission: Secondary | ICD-10-CM

## 2019-12-08 DIAGNOSIS — Z5112 Encounter for antineoplastic immunotherapy: Secondary | ICD-10-CM | POA: Diagnosis not present

## 2019-12-08 LAB — CBC WITH DIFFERENTIAL/PLATELET
Abs Immature Granulocytes: 0.02 10*3/uL (ref 0.00–0.07)
Basophils Absolute: 0.1 10*3/uL (ref 0.0–0.1)
Basophils Relative: 1 %
Eosinophils Absolute: 0.3 10*3/uL (ref 0.0–0.5)
Eosinophils Relative: 4 %
HCT: 35.2 % — ABNORMAL LOW (ref 36.0–46.0)
Hemoglobin: 11.7 g/dL — ABNORMAL LOW (ref 12.0–15.0)
Immature Granulocytes: 0 %
Lymphocytes Relative: 18 %
Lymphs Abs: 1.3 10*3/uL (ref 0.7–4.0)
MCH: 34.7 pg — ABNORMAL HIGH (ref 26.0–34.0)
MCHC: 33.2 g/dL (ref 30.0–36.0)
MCV: 104.5 fL — ABNORMAL HIGH (ref 80.0–100.0)
Monocytes Absolute: 1.4 10*3/uL — ABNORMAL HIGH (ref 0.1–1.0)
Monocytes Relative: 20 %
Neutro Abs: 4 10*3/uL (ref 1.7–7.7)
Neutrophils Relative %: 57 %
Platelets: 160 10*3/uL (ref 150–400)
RBC: 3.37 MIL/uL — ABNORMAL LOW (ref 3.87–5.11)
RDW: 17.2 % — ABNORMAL HIGH (ref 11.5–15.5)
WBC: 7.2 10*3/uL (ref 4.0–10.5)
nRBC: 0.6 % — ABNORMAL HIGH (ref 0.0–0.2)

## 2019-12-08 LAB — COMPREHENSIVE METABOLIC PANEL
ALT: 23 U/L (ref 0–44)
AST: 37 U/L (ref 15–41)
Albumin: 2.5 g/dL — ABNORMAL LOW (ref 3.5–5.0)
Alkaline Phosphatase: 109 U/L (ref 38–126)
Anion gap: 10 (ref 5–15)
BUN: 19 mg/dL (ref 8–23)
CO2: 30 mmol/L (ref 22–32)
Calcium: 8.1 mg/dL — ABNORMAL LOW (ref 8.9–10.3)
Chloride: 95 mmol/L — ABNORMAL LOW (ref 98–111)
Creatinine, Ser: 2.95 mg/dL — ABNORMAL HIGH (ref 0.44–1.00)
GFR calc Af Amer: 17 mL/min — ABNORMAL LOW (ref >60)
GFR calc non Af Amer: 15 mL/min — ABNORMAL LOW (ref >60)
Glucose, Bld: 98 mg/dL (ref 70–99)
Potassium: 3.6 mmol/L (ref 3.5–5.1)
Sodium: 135 mmol/L (ref 135–145)
Total Bilirubin: 0.5 mg/dL (ref 0.3–1.2)
Total Protein: 5.5 g/dL — ABNORMAL LOW (ref 6.5–8.1)

## 2019-12-08 MED ORDER — PALONOSETRON HCL INJECTION 0.25 MG/5ML
0.2500 mg | Freq: Once | INTRAVENOUS | Status: AC
  Administered 2019-12-08: 13:00:00 0.25 mg via INTRAVENOUS
  Filled 2019-12-08: qty 5

## 2019-12-08 MED ORDER — SODIUM CHLORIDE 0.9 % IV SOLN
225.0000 mg/m2 | Freq: Once | INTRAVENOUS | Status: AC
  Administered 2019-12-08: 460 mg via INTRAVENOUS
  Filled 2019-12-08: qty 23

## 2019-12-08 MED ORDER — SODIUM CHLORIDE 0.9 % IV SOLN: Freq: Once | INTRAVENOUS | Status: AC

## 2019-12-08 MED ORDER — BORTEZOMIB CHEMO SQ INJECTION 3.5 MG (2.5MG/ML)
1.5000 mg/m2 | Freq: Once | INTRAMUSCULAR | Status: AC
  Administered 2019-12-08: 3 mg via SUBCUTANEOUS
  Filled 2019-12-08: qty 1.2

## 2019-12-08 NOTE — Progress Notes (Signed)
Patient presents today for treatment. Vital signs within parameters for treatment. Labs reviewed by RNester NP. Verbal order received to proceed with treatment. Creatinine 2.95. Patient presents to [place] from Dialysis today. Patient has no complaints of any pain today or significant changes since her last visit.   Treatment given today per MD orders. Tolerated infusion without adverse affects. Vital signs stable. No complaints at this time. Discharged from clinic ambulatory. F/U with [facility] as scheduled.

## 2019-12-08 NOTE — Patient Instructions (Signed)
[facility] Discharge Instructions for Patients Receiving Chemotherapy  Today you received the following chemotherapy agents   To help prevent nausea and vomiting after your treatment, we encourage you to take your nausea medication   If you develop nausea and vomiting that is not controlled by your nausea medication, call the clinic.   BELOW ARE SYMPTOMS THAT SHOULD BE REPORTED IMMEDIATELY:  *FEVER GREATER THAN 100.5 F  *CHILLS WITH OR WITHOUT FEVER  NAUSEA AND VOMITING THAT IS NOT CONTROLLED WITH YOUR NAUSEA MEDICATION  *UNUSUAL SHORTNESS OF BREATH  *UNUSUAL BRUISING OR BLEEDING  TENDERNESS IN MOUTH AND THROAT WITH OR WITHOUT PRESENCE OF ULCERS  *URINARY PROBLEMS  *BOWEL PROBLEMS  UNUSUAL RASH Items with * indicate a potential emergency and should be followed up as soon as possible.  Feel free to call the clinic should you have any questions or concerns. The clinic phone number is [phone].  Please show the CHEMO ALERT CARD at check-in to the Emergency Department and triage nurse.   

## 2019-12-13 NOTE — Progress Notes (Signed)
Made multiple attempts to call patient.   Left voicemail 12-13-19 @ 1355.  Requested patient call back for day of surgery instructions.

## 2019-12-14 ENCOUNTER — Other Ambulatory Visit: Payer: Self-pay

## 2019-12-14 ENCOUNTER — Encounter (HOSPITAL_COMMUNITY): Payer: Self-pay | Admitting: Vascular Surgery

## 2019-12-14 NOTE — Progress Notes (Signed)
Same Day Work-Up Phone Call:  Spoke with Patient's daughter Amberlee Garvey who provided the patient's medical and surgical hx.  PCP - Amy L. Delphina Cahill, NP Cardiologist - Dr. Gibson Ramp Nephrology- Dr. Cherene Altes Oncologist- Derek Jack, MD  PPM/ICD - Denies  Chest x-ray - 10/07/2019 EKG - 09/24/2019 Stress Test - Denies ECHO - 09/22/2019 Cardiac Cath - Denies  Sleep Study - Yes CPAP - Yes  Denies being diabetic/ pre-diabetic  Blood Thinner Instructions: N/A Aspirin Instructions: N/A   COVID TEST- 12/15/2019 @ [facility]   Anesthesia review: Yes, cardiac hx.  Patient denies shortness of breath, fever, cough and chest pain at PAT appointment   Instructions for surgery given to patient's daughter, Talesha Ellithorpe:   Report to [facility] Main Entrance "A" at 09:30 A.M., and check in at the Admitting office.  Call this number if you have problems the morning of surgery:  [phone]   Remember:  Do not eat or drink after midnight the night before your surgery.    Take these medicines the morning of surgery with A SIP OF WATER: Tylenol, Atorvastatin, Nasonex, Omeprazole, Compazine, Acyclovir  As of today, STOP taking any Aspirin (unless otherwise instructed by your surgeon), Aleve, Naproxen, Ibuprofen, Motrin, Advil, Goody's, BC's, all herbal medications, fish oil, and all vitamins.    The Morning of Surgery  Do not wear jewelry, make-up or nail polish.  Do not wear lotions, powders, or perfumes/colognes, or deodorant  Do not bring valuables to the hospital.  [facility] is not responsible for any belongings or valuables.  Special instructions:      1. Shower the NIGHT BEFORE SURGERY and the MORNING OF SURGERY with antibacterial (e.g dial) Soap.  2. Wear CLEAN PAJAMAS to bed the night before surgery, wear comfortable clothes the morning of surgery 3. Place CLEAN SHEETS on your bed the night of your first shower and DO NOT SLEEP WITH PETS.  Remember to brush your teeth  WITH YOUR REGULAR TOOTHPASTE the day of surgery.

## 2019-12-15 ENCOUNTER — Other Ambulatory Visit (HOSPITAL_COMMUNITY)
Admission: RE | Admit: 2019-12-15 | Discharge: 2019-12-15 | Disposition: A | Discharge status: Home / Self Care | Payer: Medicare Other | Source: Ambulatory Visit | Attending: Vascular Surgery | Admitting: Vascular Surgery

## 2019-12-15 ENCOUNTER — Inpatient Hospital Stay (HOSPITAL_COMMUNITY): Payer: Medicare Other

## 2019-12-15 ENCOUNTER — Encounter (HOSPITAL_COMMUNITY): Payer: Self-pay

## 2019-12-15 VITALS — BP 106/56 | HR 94 | Temp 98.3°F | Resp 18 | Wt 195.2 lb

## 2019-12-15 DIAGNOSIS — C9000 Multiple myeloma not having achieved remission: Secondary | ICD-10-CM

## 2019-12-15 DIAGNOSIS — Z01812 Encounter for preprocedural laboratory examination: Secondary | ICD-10-CM | POA: Diagnosis present

## 2019-12-15 DIAGNOSIS — Z5112 Encounter for antineoplastic immunotherapy: Secondary | ICD-10-CM | POA: Diagnosis not present

## 2019-12-15 DIAGNOSIS — Z20828 Contact with and (suspected) exposure to other viral communicable diseases: Secondary | ICD-10-CM | POA: Insufficient documentation

## 2019-12-15 LAB — CBC WITH DIFFERENTIAL/PLATELET
Abs Immature Granulocytes: 0.02 10*3/uL (ref 0.00–0.07)
Basophils Absolute: 0.1 10*3/uL (ref 0.0–0.1)
Basophils Relative: 1 %
Eosinophils Absolute: 0.3 10*3/uL (ref 0.0–0.5)
Eosinophils Relative: 4 %
HCT: 33.6 % — ABNORMAL LOW (ref 36.0–46.0)
Hemoglobin: 11.2 g/dL — ABNORMAL LOW (ref 12.0–15.0)
Immature Granulocytes: 0 %
Lymphocytes Relative: 13 %
Lymphs Abs: 0.8 10*3/uL (ref 0.7–4.0)
MCH: 35.1 pg — ABNORMAL HIGH (ref 26.0–34.0)
MCHC: 33.3 g/dL (ref 30.0–36.0)
MCV: 105.3 fL — ABNORMAL HIGH (ref 80.0–100.0)
Monocytes Absolute: 0.8 10*3/uL (ref 0.1–1.0)
Monocytes Relative: 12 %
Neutro Abs: 4.5 10*3/uL (ref 1.7–7.7)
Neutrophils Relative %: 70 %
Platelets: 158 10*3/uL (ref 150–400)
RBC: 3.19 MIL/uL — ABNORMAL LOW (ref 3.87–5.11)
RDW: 18.1 % — ABNORMAL HIGH (ref 11.5–15.5)
WBC: 6.4 10*3/uL (ref 4.0–10.5)
nRBC: 0.9 % — ABNORMAL HIGH (ref 0.0–0.2)

## 2019-12-15 LAB — COMPREHENSIVE METABOLIC PANEL
ALT: 24 U/L (ref 0–44)
AST: 39 U/L (ref 15–41)
Albumin: 2.5 g/dL — ABNORMAL LOW (ref 3.5–5.0)
Alkaline Phosphatase: 107 U/L (ref 38–126)
Anion gap: 11 (ref 5–15)
BUN: 25 mg/dL — ABNORMAL HIGH (ref 8–23)
CO2: 27 mmol/L (ref 22–32)
Calcium: 7.8 mg/dL — ABNORMAL LOW (ref 8.9–10.3)
Chloride: 97 mmol/L — ABNORMAL LOW (ref 98–111)
Creatinine, Ser: 3.58 mg/dL — ABNORMAL HIGH (ref 0.44–1.00)
GFR calc Af Amer: 14 mL/min — ABNORMAL LOW (ref >60)
GFR calc non Af Amer: 12 mL/min — ABNORMAL LOW (ref >60)
Glucose, Bld: 122 mg/dL — ABNORMAL HIGH (ref 70–99)
Potassium: 3.4 mmol/L — ABNORMAL LOW (ref 3.5–5.1)
Sodium: 135 mmol/L (ref 135–145)
Total Bilirubin: 0.5 mg/dL (ref 0.3–1.2)
Total Protein: 5.3 g/dL — ABNORMAL LOW (ref 6.5–8.1)

## 2019-12-15 LAB — LACTATE DEHYDROGENASE: LDH: 249 U/L — ABNORMAL HIGH (ref 98–192)

## 2019-12-15 LAB — SARS CORONAVIRUS 2 (TAT 6-24 HRS): SARS Coronavirus 2: NEGATIVE

## 2019-12-15 MED ORDER — SODIUM CHLORIDE 0.9 % IV SOLN: Freq: Once | INTRAVENOUS | Status: DC

## 2019-12-15 MED ORDER — SODIUM CHLORIDE 0.9 % IV SOLN
225.0000 mg/m2 | Freq: Once | INTRAVENOUS | Status: AC
  Administered 2019-12-15: 460 mg via INTRAVENOUS
  Filled 2019-12-15: qty 23

## 2019-12-15 MED ORDER — SODIUM CHLORIDE 0.9 % IV SOLN: INTRAVENOUS | Status: DC

## 2019-12-15 MED ORDER — BORTEZOMIB CHEMO SQ INJECTION 3.5 MG (2.5MG/ML)
1.5000 mg/m2 | Freq: Once | INTRAMUSCULAR | Status: AC
  Administered 2019-12-15: 3 mg via SUBCUTANEOUS
  Filled 2019-12-15: qty 1.2

## 2019-12-15 MED ORDER — PALONOSETRON HCL INJECTION 0.25 MG/5ML
INTRAVENOUS | Status: AC
  Filled 2019-12-15: qty 5

## 2019-12-15 MED ORDER — CEFAZOLIN SODIUM-DEXTROSE 2-4 GM/100ML-% IV SOLN
2.0000 g | INTRAVENOUS | Status: AC
  Administered 2019-12-19: 2 g via INTRAVENOUS
  Filled 2019-12-15: qty 100

## 2019-12-15 MED ORDER — PALONOSETRON HCL INJECTION 0.25 MG/5ML
0.2500 mg | Freq: Once | INTRAVENOUS | Status: AC
  Administered 2019-12-15: 12:00:00 0.25 mg via INTRAVENOUS

## 2019-12-15 NOTE — Patient Instructions (Signed)
[facility] Discharge Instructions for Patients Receiving Chemotherapy   Beginning January 23rd 2017 lab work for the [facility] will be done in the  Main lab at [facility] on 1st floor. If you have a lab appointment with the [place] please come in thru the  Main Entrance and check in at the main information desk   Today you received the following chemotherapy agents Cytoxan and Velcade. Follow-up as scheduled. Call clinic for any questions or concerns  To help prevent nausea and vomiting after your treatment, we encourage you to take your nausea medication   If you develop nausea and vomiting, or diarrhea that is not controlled by your medication, call the clinic.  The clinic phone number is [phone]. Office hours are Monday-Friday 8:30am-5:00pm.  BELOW ARE SYMPTOMS THAT SHOULD BE REPORTED IMMEDIATELY:  *FEVER GREATER THAN 101.0 F  *CHILLS WITH OR WITHOUT FEVER  NAUSEA AND VOMITING THAT IS NOT CONTROLLED WITH YOUR NAUSEA MEDICATION  *UNUSUAL SHORTNESS OF BREATH  *UNUSUAL BRUISING OR BLEEDING  TENDERNESS IN MOUTH AND THROAT WITH OR WITHOUT PRESENCE OF ULCERS  *URINARY PROBLEMS  *BOWEL PROBLEMS  UNUSUAL RASH Items with * indicate a potential emergency and should be followed up as soon as possible. If you have an emergency after office hours please contact your primary care physician or go to the nearest emergency department.  Please call the clinic during office hours if you have any questions or concerns.   You may also contact the Patient Navigator at [phone] should you have any questions or need assistance in obtaining follow up care.      Resources For Cancer Patients and their Caregivers ? American Cancer Society: Can assist with transportation, wigs, general needs, runs Look Good Feel Better.        442-032-9698 ? Cancer Care: Provides financial assistance, online support groups, medication/co-pay assistance.   1-800-813-HOPE (904)324-8414) ? [place] Assists [place] cancer patients and their families through emotional , educational and financial support.  (929)779-4255 ? [place] DSS Where to apply for food stamps, Medicaid and utility assistance. 765-035-4037 ? [facility]: Transportation to medical appointments. 908-093-5724 ? Social Security Administration: May apply for disability if have a Stage IV cancer. 434-584-0411 781-362-3165 ? [employer], Disability and Transit Services: Assists with nutrition, care and transit needs. ([phone]

## 2019-12-15 NOTE — Progress Notes (Signed)
[place] reviewed with and pt seen by Dr. Delton Coombes and pt approved for Cytoxan infusion and Velcade injection today per MD                                                                                                    Sophia Mcmahon tolerated Cytoxan infusion and Velcade injection well without complaints or incident. Peripheral IV site checked with positive blood return noted prior to and after infusion. VSS upon discharge. Pt discharged self ambulatory using her cane in satisfactory condition accompanied by her duaghter

## 2019-12-19 ENCOUNTER — Encounter (HOSPITAL_COMMUNITY)
Admission: RE | Disposition: A | Discharge status: Home / Self Care | Payer: Self-pay | Source: Home / Self Care | Attending: Vascular Surgery

## 2019-12-19 ENCOUNTER — Encounter (HOSPITAL_COMMUNITY): Payer: Self-pay | Admitting: Vascular Surgery

## 2019-12-19 ENCOUNTER — Ambulatory Visit (HOSPITAL_COMMUNITY): Payer: Medicare Other | Admitting: Anesthesiology

## 2019-12-19 ENCOUNTER — Ambulatory Visit (HOSPITAL_COMMUNITY)
Admission: RE | Admit: 2019-12-19 | Discharge: 2019-12-19 | Disposition: A | Discharge status: Home / Self Care | Payer: Medicare Other | Attending: Vascular Surgery | Admitting: Vascular Surgery

## 2019-12-19 ENCOUNTER — Other Ambulatory Visit: Payer: Self-pay

## 2019-12-19 DIAGNOSIS — T82868A Thrombosis of vascular prosthetic devices, implants and grafts, initial encounter: Secondary | ICD-10-CM | POA: Insufficient documentation

## 2019-12-19 DIAGNOSIS — Z992 Dependence on renal dialysis: Secondary | ICD-10-CM | POA: Insufficient documentation

## 2019-12-19 DIAGNOSIS — I2510 Atherosclerotic heart disease of native coronary artery without angina pectoris: Secondary | ICD-10-CM | POA: Diagnosis not present

## 2019-12-19 DIAGNOSIS — Z882 Allergy status to sulfonamides status: Secondary | ICD-10-CM | POA: Diagnosis not present

## 2019-12-19 DIAGNOSIS — N185 Chronic kidney disease, stage 5: Secondary | ICD-10-CM

## 2019-12-19 DIAGNOSIS — Z7952 Long term (current) use of systemic steroids: Secondary | ICD-10-CM | POA: Diagnosis not present

## 2019-12-19 DIAGNOSIS — I509 Heart failure, unspecified: Secondary | ICD-10-CM | POA: Diagnosis not present

## 2019-12-19 DIAGNOSIS — I132 Hypertensive heart and chronic kidney disease with heart failure and with stage 5 chronic kidney disease, or end stage renal disease: Secondary | ICD-10-CM | POA: Insufficient documentation

## 2019-12-19 DIAGNOSIS — I340 Nonrheumatic mitral (valve) insufficiency: Secondary | ICD-10-CM | POA: Diagnosis not present

## 2019-12-19 DIAGNOSIS — K219 Gastro-esophageal reflux disease without esophagitis: Secondary | ICD-10-CM | POA: Diagnosis not present

## 2019-12-19 DIAGNOSIS — M1990 Unspecified osteoarthritis, unspecified site: Secondary | ICD-10-CM | POA: Insufficient documentation

## 2019-12-19 DIAGNOSIS — R569 Unspecified convulsions: Secondary | ICD-10-CM | POA: Diagnosis not present

## 2019-12-19 DIAGNOSIS — D631 Anemia in chronic kidney disease: Secondary | ICD-10-CM | POA: Insufficient documentation

## 2019-12-19 DIAGNOSIS — Z7951 Long term (current) use of inhaled steroids: Secondary | ICD-10-CM | POA: Insufficient documentation

## 2019-12-19 DIAGNOSIS — Z79899 Other long term (current) drug therapy: Secondary | ICD-10-CM | POA: Diagnosis not present

## 2019-12-19 DIAGNOSIS — E039 Hypothyroidism, unspecified: Secondary | ICD-10-CM | POA: Diagnosis not present

## 2019-12-19 DIAGNOSIS — X58XXXA Exposure to other specified factors, initial encounter: Secondary | ICD-10-CM | POA: Diagnosis not present

## 2019-12-19 DIAGNOSIS — N186 End stage renal disease: Secondary | ICD-10-CM | POA: Insufficient documentation

## 2019-12-19 DIAGNOSIS — C9000 Multiple myeloma not having achieved remission: Secondary | ICD-10-CM | POA: Diagnosis not present

## 2019-12-19 DIAGNOSIS — G4730 Sleep apnea, unspecified: Secondary | ICD-10-CM | POA: Diagnosis not present

## 2019-12-19 HISTORY — DX: Unspecified osteoarthritis, unspecified site: M19.90

## 2019-12-19 HISTORY — DX: Anemia, unspecified: D64.9

## 2019-12-19 HISTORY — DX: Gastro-esophageal reflux disease without esophagitis: K21.9

## 2019-12-19 HISTORY — PX: BASCILIC VEIN TRANSPOSITION: SHX5742

## 2019-12-19 LAB — POCT I-STAT, CHEM 8
BUN: 35 mg/dL — ABNORMAL HIGH (ref 8–23)
Calcium, Ion: 1.06 mmol/L — ABNORMAL LOW (ref 1.15–1.40)
Chloride: 101 mmol/L (ref 98–111)
Creatinine, Ser: 5.6 mg/dL — ABNORMAL HIGH (ref 0.44–1.00)
Glucose, Bld: 87 mg/dL (ref 70–99)
HCT: 37 % (ref 36.0–46.0)
Hemoglobin: 12.6 g/dL (ref 12.0–15.0)
Potassium: 3.9 mmol/L (ref 3.5–5.1)
Sodium: 137 mmol/L (ref 135–145)
TCO2: 28 mmol/L (ref 22–32)

## 2019-12-19 LAB — KAPPA/LAMBDA LIGHT CHAINS
Kappa free light chain: 96.7 mg/L — ABNORMAL HIGH (ref 3.3–19.4)
Kappa, lambda light chain ratio: 0.16 — ABNORMAL LOW (ref 0.26–1.65)
Lambda free light chains: 595.9 mg/L — ABNORMAL HIGH (ref 5.7–26.3)

## 2019-12-19 SURGERY — TRANSPOSITION, VEIN, BASILIC
Anesthesia: Monitor Anesthesia Care | Site: Arm Upper | Laterality: Left

## 2019-12-19 MED ORDER — PHENYLEPHRINE HCL (PRESSORS) 10 MG/ML IV SOLN
INTRAVENOUS | Status: DC | PRN
  Administered 2019-12-19 (×2): 80 ug via INTRAVENOUS

## 2019-12-19 MED ORDER — CHLORHEXIDINE GLUCONATE 4 % EX LIQD: 60.0000 mL | Freq: Once | CUTANEOUS | Status: DC

## 2019-12-19 MED ORDER — HEPARIN SODIUM (PORCINE) 1000 UNIT/ML IJ SOLN
INTRAMUSCULAR | Status: DC | PRN
  Administered 2019-12-19: 3000 [IU] via INTRAVENOUS

## 2019-12-19 MED ORDER — SODIUM CHLORIDE 0.9 % IV SOLN
INTRAVENOUS | Status: DC | PRN
  Administered 2019-12-19: 500 mL

## 2019-12-19 MED ORDER — FENTANYL CITRATE (PF) 100 MCG/2ML IJ SOLN: INTRAMUSCULAR | Status: DC | PRN

## 2019-12-19 MED ORDER — PROPOFOL 10 MG/ML IV BOLUS
INTRAVENOUS | Status: DC | PRN
  Administered 2019-12-19: 20 mg via INTRAVENOUS

## 2019-12-19 MED ORDER — HYDROCODONE-ACETAMINOPHEN 5-325 MG PO TABS: 1.0000 | ORAL_TABLET | ORAL | 0 refills | Status: AC | PRN

## 2019-12-19 MED ORDER — LIDOCAINE HCL (PF) 1 % IJ SOLN
INTRAMUSCULAR | Status: AC
  Filled 2019-12-19: qty 30

## 2019-12-19 MED ORDER — 0.9 % SODIUM CHLORIDE (POUR BTL) OPTIME
TOPICAL | Status: DC | PRN
  Administered 2019-12-19: 1000 mL

## 2019-12-19 MED ORDER — FENTANYL CITRATE (PF) 100 MCG/2ML IJ SOLN
INTRAMUSCULAR | Status: DC | PRN
  Administered 2019-12-19 (×2): 25 ug via INTRAVENOUS

## 2019-12-19 MED ORDER — SODIUM CHLORIDE 0.9 % IV SOLN: INTRAVENOUS | Status: DC

## 2019-12-19 MED ORDER — PROPOFOL 500 MG/50ML IV EMUL
INTRAVENOUS | Status: DC | PRN
  Administered 2019-12-19: 75 ug/kg/min via INTRAVENOUS

## 2019-12-19 MED ORDER — SODIUM CHLORIDE 0.9 % IV SOLN
INTRAVENOUS | Status: AC
  Filled 2019-12-19: qty 1.2

## 2019-12-19 MED ORDER — LIDOCAINE HCL 1 % IJ SOLN
INTRAMUSCULAR | Status: DC | PRN
  Administered 2019-12-19: 30 mL

## 2019-12-19 MED ORDER — FENTANYL CITRATE (PF) 250 MCG/5ML IJ SOLN
INTRAMUSCULAR | Status: AC
  Filled 2019-12-19: qty 5

## 2019-12-19 SURGICAL SUPPLY — 37 items
ARMBAND PINK RESTRICT EXTREMIT (MISCELLANEOUS) ×3 IMPLANT
CANISTER SUCT 3000ML PPV (MISCELLANEOUS) ×3 IMPLANT
CLIP VESOCCLUDE MED 24/CT (CLIP) ×3 IMPLANT
CLIP VESOCCLUDE SM WIDE 24/CT (CLIP) ×3 IMPLANT
COVER PROBE W GEL 5X96 (DRAPES) ×3 IMPLANT
COVER WAND RF STERILE (DRAPES) ×3 IMPLANT
DECANTER SPIKE VIAL GLASS SM (MISCELLANEOUS) ×3 IMPLANT
DERMABOND ADVANCED (GAUZE/BANDAGES/DRESSINGS) ×2
DERMABOND ADVANCED .7 DNX12 (GAUZE/BANDAGES/DRESSINGS) ×1 IMPLANT
ELECT REM PT RETURN 9FT ADLT (ELECTROSURGICAL) ×3
ELECTRODE REM PT RTRN 9FT ADLT (ELECTROSURGICAL) ×1 IMPLANT
GLOVE BIO SURGEON STRL SZ7.5 (GLOVE) ×3 IMPLANT
GLOVE BIOGEL PI IND STRL 8 (GLOVE) ×1 IMPLANT
GLOVE BIOGEL PI INDICATOR 8 (GLOVE) ×2
GOWN SPEC L3 XXLG W/TWL (GOWN DISPOSABLE) ×3 IMPLANT
GOWN STRL REUS W/ TWL LRG LVL3 (GOWN DISPOSABLE) ×1 IMPLANT
GOWN STRL REUS W/ TWL XL LVL3 (GOWN DISPOSABLE) ×1 IMPLANT
GOWN STRL REUS W/TWL LRG LVL3 (GOWN DISPOSABLE) ×2
GOWN STRL REUS W/TWL XL LVL3 (GOWN DISPOSABLE) ×2
HEMOSTAT SPONGE AVITENE ULTRA (HEMOSTASIS) IMPLANT
KIT BASIN OR (CUSTOM PROCEDURE TRAY) ×3 IMPLANT
KIT TURNOVER KIT B (KITS) ×3 IMPLANT
NEEDLE HYPO 25GX1X1/2 BEV (NEEDLE) ×3 IMPLANT
NS IRRIG 1000ML POUR BTL (IV SOLUTION) ×3 IMPLANT
PACK CV ACCESS (CUSTOM PROCEDURE TRAY) ×3 IMPLANT
PAD ARMBOARD 7.5X6 YLW CONV (MISCELLANEOUS) ×6 IMPLANT
SUT MNCRL AB 4-0 PS2 18 (SUTURE) ×3 IMPLANT
SUT PROLENE 6 0 BV (SUTURE) ×3 IMPLANT
SUT PROLENE 7 0 BV 1 (SUTURE) IMPLANT
SUT SILK 2 0 SH (SUTURE) IMPLANT
SUT VIC AB 2-0 CT1 27 (SUTURE)
SUT VIC AB 2-0 CT1 TAPERPNT 27 (SUTURE) IMPLANT
SUT VIC AB 3-0 SH 27 (SUTURE) ×4
SUT VIC AB 3-0 SH 27X BRD (SUTURE) ×2 IMPLANT
TOWEL GREEN STERILE (TOWEL DISPOSABLE) ×3 IMPLANT
UNDERPAD 30X30 (UNDERPADS AND DIAPERS) ×3 IMPLANT
WATER STERILE IRR 1000ML POUR (IV SOLUTION) ×3 IMPLANT

## 2019-12-19 NOTE — Op Note (Signed)
OPERATIVE NOTE   PROCEDURE: 1. left first stage basilic vein transposition (brachiobasilic arteriovenous fistula) placement  PRE-OPERATIVE DIAGNOSIS: ESRD  POST-OPERATIVE DIAGNOSIS: same  SURGEON: Marty Heck, MD  ASSISTANT(S): OR staff  ANESTHESIA: MAC  ESTIMATED BLOOD LOSS: Minimal  FINDING(S): 1.  Basilic vein: 2.5 mm, acceptable 2.  Brachial artery: 3 mm, atherosclerotic disease evident 3.  Venous outflow: palpable thrill  4.  Radial flow: dopplerable radial signal  SPECIMEN(S):  none  INDICATIONS:   Sophia Mcmahon is a 78 y.o. female who presents with thrombosed left arm brachiocephalic AVF and need for new permanent hemodialysis access.  The patient is scheduled for left first stage basilic vein transposition.  The patient is aware the risks include but are not limited to: bleeding, infection, steal syndrome, nerve damage, ischemic monomelic neuropathy, failure to mature, and need for additional procedures.  The patient is aware of the risks of the procedure and elects to proceed forward.   DESCRIPTION: After full informed written consent was obtained from the patient, the patient was brought back to the operating room and placed supine upon the operating table.  Prior to induction, the patient received IV antibiotics.   After obtaining adequate anesthesia, the patient was then prepped and draped in the standard fashion for a left arm access procedure.  I turned my attention first to identifying the patient's basilic vein and brachial artery.    Using SonoSite guidance, the location of these vessels were marked out on the skin.   At this point, I injected local anesthetic to obtain a field block of the antecubitum.  In total, I injected about 8 mL of 1% lidocaine without epinephrine.  I made a transverse incision just above the level of the antecubitum and dissected through the subcutaneous tissue and fascia to gain exposure of the brachial artery.  This was noted  to be 3 mm in diameter externally.  This was dissected out proximally and distally and controlled with vessel loops .  I then dissected out the basilic vein.  This was noted to be 2.5 mm in diameter externally.  The distal segment of the vein was ligated with a  2-0 silk, and the vein was transected.  The proximal segment was interrogated with serial dilators.  The vein accepted up to a 4 mm dilator without any difficulty.  I then instilled the heparinized saline into the vein and clamped it.  The patient was given 3000 units IV heparin.  At this point, I reset my exposure of the brachial artery and placed the artery under tension proximally and distally.  I made an arteriotomy with a #11 blade, and then I extended the arteriotomy with a Potts scissor.  I injected heparinized saline proximal and distal to this arteriotomy.  The vein was then sewn to the artery in an end-to-side configuration with a running stitch of 6-0 Prolene.  Prior to completing this anastomosis, I allowed the vein and artery to backbleed.  There was no evidence of clot from any vessels.  I completed the anastomosis in the usual fashion and then released all vessel loops and clamps.    There was a palpable thrill in the venous outflow, and there was a dopplerable radial signal.  At this point, I irrigated out the surgical wound.  There was no further active bleeding.  The subcutaneous tissue was reapproximated with a running stitch of 3-0 Vicryl.  The skin was then reapproximated with a running subcuticular stitch of 4-0 Monocryl.  The  skin was then cleaned, dried, and reinforced with Dermabond.  The patient tolerated this procedure well.    COMPLICATIONS: None  CONDITION: Stable   Marty Heck MD [facility] [place] Office: [phone] Pager: [phone]  12/19/2019, 2:20 PM

## 2019-12-19 NOTE — Discharge Instructions (Signed)
[facility]  Discharge Instructions  AV Fistula or Graft Surgery for Dialysis Access  Please refer to the following instructions for your post-procedure care. Your surgeon or physician assistant will discuss any changes with you.  Activity  You may drive the day following your surgery, if you are comfortable and no longer taking prescription pain medication. Resume full activity as the soreness in your incision resolves.  Bathing/Showering  You may shower after you go home. Keep your incision dry for 48 hours. Do not soak in a bathtub, hot tub, or swim until the incision heals completely. You may not shower if you have a hemodialysis catheter.  Incision Care  Clean your incision with mild soap and water after 48 hours. Pat the area dry with a clean towel. You do not need a bandage unless otherwise instructed. Do not apply any ointments or creams to your incision. You may have skin glue on your incision. Do not peel it off. It will come off on its own in about one week. Your arm may swell a bit after surgery. To reduce swelling use pillows to elevate your arm so it is above your heart. Your doctor will tell you if you need to lightly wrap your arm with an ACE bandage.  Diet  Resume your normal diet. There are not special food restrictions following this procedure. In order to heal from your surgery, it is CRITICAL to get adequate nutrition. Your body requires vitamins, minerals, and protein. Vegetables are the best source of vitamins and minerals. Vegetables also provide the perfect balance of protein. Processed food has little nutritional value, so try to avoid this.  Medications  Resume taking all of your medications. If your incision is causing pain, you may take over-the counter pain relievers such as acetaminophen (Tylenol). If you were prescribed a stronger pain medication, please be aware these medications can cause nausea and constipation. Prevent  nausea by taking the medication with a snack or meal. Avoid constipation by drinking plenty of fluids and eating foods with high amount of fiber, such as fruits, vegetables, and grains.  Do not take Tylenol if you are taking prescription pain medications.  Follow up Your surgeon may want to see you in the office following your access surgery. If so, this will be arranged at the time of your surgery.  Please call us immediately for any of the following conditions:  . Increased pain, redness, drainage (pus) from your incision site . Fever of 101 degrees or higher . Severe or worsening pain at your incision site . Hand pain or numbness. .  Reduce your risk of vascular disease:  . Stop smoking. If you would like help, call [facility] at 1-800-QUIT-NOW 763-456-8574) or [facility] at 970-880-1080  . Manage your cholesterol . Maintain a desired weight . Control your diabetes . Keep your blood pressure down  Dialysis  It will take several weeks to several months for your new dialysis access to be ready for use. Your surgeon will determine when it is okay to use it. Your nephrologist will continue to direct your dialysis. You can continue to use your Permcath until your new access is ready for use.   12/19/2019 Sophia Mcmahon [id number] 08-25-1941  Surgeon(s): Marty Heck, MD  Procedure(s): FIRST STAGE BASILIC VEIN TRANSPOSITION LEFT ARM   May stick graft immediately   May stick graft on designated area only:   x Do not stick until follow-up in clinic    If  you have any questions, please call the office at [phone].

## 2019-12-19 NOTE — Anesthesia Preprocedure Evaluation (Addendum)
Anesthesia Evaluation  Patient identified by MRN, date of birth, ID band Patient awake    Reviewed: Allergy & Precautions, NPO status , Patient's Chart, lab work & pertinent test results  History of Anesthesia Complications Negative for: history of anesthetic complications  Airway Mallampati: II  TM Distance: >3 FB Neck ROM: Full    Dental  (+) Partial Upper, Dental Advisory Given   Pulmonary sleep apnea and Continuous Positive Airway Pressure Ventilation ,    Pulmonary exam normal        Cardiovascular hypertension, (-) angina+ CAD and +CHF  Normal cardiovascular exam+ Valvular Problems/Murmurs    '20 TTE - EF 65 to 70%. Severely increased LVH. LV diastolic Doppler parameters are consistent with pseudonormalization pattern of LV diastolic filling. LA and RA were severely dilated. Mild MR and MS. Tirival TR.    Neuro/Psych Seizures -, Well Controlled,  negative psych ROS   GI/Hepatic Neg liver ROS, GERD  Medicated and Controlled,  Endo/Other  Hypothyroidism (no meds)   Renal/GU ESRF and DialysisRenal disease     Musculoskeletal  (+) Arthritis ,   Abdominal   Peds  Hematology  (+) anemia ,  Multiple myeloma    Anesthesia Other Findings Covid neg 12/24   Reproductive/Obstetrics                           Anesthesia Physical Anesthesia Plan  ASA: III  Anesthesia Plan: MAC   Post-op Pain Management:    Induction: Intravenous  PONV Risk Score and Plan: 2 and Propofol infusion and Treatment may vary due to age or medical condition  Airway Management Planned: Natural Airway and Simple Face Mask  Additional Equipment: None  Intra-op Plan:   Post-operative Plan:   Informed Consent: I have reviewed the patients History and Physical, chart, labs and discussed the procedure including the risks, benefits and alternatives for the proposed anesthesia with the patient or authorized  representative who has indicated his/her understanding and acceptance.       Plan Discussed with: CRNA and Anesthesiologist  Anesthesia Plan Comments:        Anesthesia Quick Evaluation

## 2019-12-19 NOTE — Anesthesia Postprocedure Evaluation (Signed)
Anesthesia Post Note  Patient: Database administrator  Procedure(s) Performed: FIRST STAGE BASILIC VEIN TRANSPOSITION LEFT ARM (Left Arm Upper)     Patient location during evaluation: PACU Anesthesia Type: MAC Level of consciousness: awake and alert Pain management: pain level controlled Vital Signs Assessment: post-procedure vital signs reviewed and stable Respiratory status: spontaneous breathing, nonlabored ventilation and respiratory function stable Cardiovascular status: stable and blood pressure returned to baseline Anesthetic complications: no    Last Vitals:  Vitals:   12/19/19 1450 12/19/19 1505  BP: 126/62 127/62  Pulse: 88 92  Resp: 16 11  Temp: (!) 36.4 C   SpO2: 99% 97%    Last Pain:  Vitals:   12/19/19 1450  TempSrc:   PainSc: 0-No pain                 Audry Pili

## 2019-12-19 NOTE — H&P (Signed)
History and Physical Interval Note:  12/19/2019 1:03 PM  Sophia Mcmahon  has presented today for surgery, with the diagnosis of END STAGE RENAL DISEASE FOR HEMODIALYSIS ACCESS.  The various methods of treatment have been discussed with the patient and family. After consideration of risks, benefits and other options for treatment, the patient has consented to  Procedure(s): FIRST STAGE BASILIC VEIN TRANSPOSITION LEFT ARM (Left) as a surgical intervention.  The patient's history has been reviewed, patient examined, no change in status, stable for surgery.  I have reviewed the patient's chart and labs.  Questions were answered to the patient's satisfaction.    Left arm AVF vs graft  Marty Heck     CC:  F/u for surgery  HPI:  This is a 78 y.o. female who is s/p  1.Exchange ofright internal jugular veintemporary dialysis catheter for atunneled dialysis catheter (19 cm palindrome) 2. Left brachiocephalic arteriovenous fistula placement By Dr. Carlis Abbott on 10/07/2019.    She returns today for follow up.  She dialyzes at [facility] in [place] on T/T/S.  She has chemo infusion on Thursdays at [facility].  She denies any pain in her left hand.  She states they told her that her fistula was not working at dialysis about 3 weeks ago.  They called and was told we could just see her at her office visit.   She is currently dialyzing via [facility] that is working well.    Allergies  Allergen Reactions  . Sulfa Antibiotics Hives          Current Outpatient Medications  Medication Sig Dispense Refill  . acetaminophen (TYLENOL) 325 MG tablet Take 650 mg by mouth every 6 (six) hours as needed.    Marland Kitchen acyclovir (ZOVIRAX) 400 MG tablet Take 1 tablet (400 mg total) by mouth 2 (two) times daily. 60 tablet 6  . atorvastatin (LIPITOR) 80 MG tablet Take 80 mg by mouth daily.    . bortezomib IV (VELCADE) 3.5 MG injection Inject into the vein once a week.    . CYCLOPHOSPHAMIDE IV Inject into  the vein once a week.    Marland Kitchen dexamethasone (DECADRON) 4 MG tablet Take 5 tablets (20 mg) on days 1, 8, 15, and 22 of chemo. Repeat every 28 days. Take 2 tablets (8mg ) daily for 2 days after days 1 and 8 of chemo. 30 tablet 3  . docusate sodium (COLACE) 100 MG capsule Take 100 mg by mouth daily.    . furosemide (LASIX) 80 MG tablet Take 1 tablet (80 mg total) by mouth 2 (two) times daily. 60 tablet 0  . levothyroxine (SYNTHROID) 25 MCG tablet Take 1 tablet (25 mcg total) by mouth daily at 6 (six) AM. 30 tablet 0  . mometasone (NASONEX) 50 MCG/ACT nasal spray Place 1 spray into the nose 2 (two) times daily. 1 spray into each nostril twice daily    . omeprazole (PRILOSEC) 40 MG capsule Take 40 mg by mouth daily.    . phenytoin (DILANTIN) 100 MG ER capsule Take 3 capsules by mouth at bedtime.    . prochlorperazine (COMPAZINE) 10 MG tablet Take 1 tablet (10 mg total) by mouth every 6 (six) hours as needed (Nausea or vomiting). (Patient not taking: Reported on 10/28/2019) 30 tablet 1  . torsemide (DEMADEX) 100 MG tablet Take 100 mg by mouth 2 (two) times daily.      No current facility-administered medications for this visit.      ROS:  See HPI  Physical Exam:  Today's Vitals   11/22/19 1254  BP: (!) 94/57  Pulse: 96  Resp: 14  Temp: 97.9 F (36.6 C)  TempSrc: Temporal  SpO2: 98%  Weight: 193 lb (87.5 kg)  Height: 5\' 9"  (1.753 m)   Body mass index is 28.5 kg/m.   Incision:  Well healed Extremities:  Palpable bilateral radial and ulnar pulses.  There is no bruit or thrill in fistula; motor and sensation are in tact.     Dialysis duplex 11/22/2019: OUTFLOW VEINPSV (cm/s)Diameter (cm)Depth (cm)Describe +------------+----------+-------------+----------+--------+ Prox UA  17  0.23  0.44   +------------+----------+-------------+----------+--------+ Mid UA  12  0.17  0.64    +------------+----------+-------------+----------+--------+ Dist UA  11  0.28  0.29 occluded +------------+----------+-------------+----------+--------+ AC Fossa    occluded +------------+----------+-------------+----------+--------+  Vein Mapping 10/05/2019: +-----------------+-------------+----------+--------------+ Left Basilic Diameter (cm)Depth (cm) Findings  +-----------------+-------------+----------+--------------+ Prox upper arm  0.51    +-----------------+-------------+----------+--------------+ Mid upper arm  0.45    +-----------------+-------------+----------+--------------+ Dist upper arm  0.33   branching  +-----------------+-------------+----------+--------------+ Antecubital fossa 0.18   branching  +-----------------+-------------+----------+--------------+ Prox forearm  0.12    +-----------------+-------------+----------+--------------+ Mid forearm   not visualized +-----------------+-------------+----------+--------------+ Distal forearm   not visualized +-----------------+-------------+----------+--------------+ Wrist   not visualized +-----------------+-------------+----------+--------------+   Assessment/Plan:  This is a 77 y.o. female who is s/p: 1.Exchange ofright internal jugular veintemporary dialysis catheter for atunneled dialysis catheter (19 cm palindrome) 2. Left brachiocephalic arteriovenous fistula placement By Dr. Carlis Abbott on 10/07/2019  -left BC AVF is occluded.  Dr. Carlis Abbott evaluated pt and LUE vein mapping.  At the [facility] fossa, the vein is small, but in the distal arm, it is good size and branches.  Dr.  Carlis Abbott feels we could use that branch and perform 1st stage left BVT.  Pt and her daughter are in agreement.  Will schedule this at their convenience on a Friday with Dr. Carlis Abbott.    Leontine Locket, PA-C [facility] [phone]  Clinic MD:  Carlis Abbott

## 2019-12-19 NOTE — Transfer of Care (Signed)
Immediate Anesthesia Transfer of Care Note  Patient: Database administrator  Procedure(s) Performed: FIRST STAGE BASILIC VEIN TRANSPOSITION LEFT ARM (Left Arm Upper)  Patient Location: PACU  Anesthesia Type:MAC  Level of Consciousness: awake, alert  and oriented  Airway & Oxygen Therapy: Patient Spontanous Breathing  Post-op Assessment: Report given to RN, Post -op Vital signs reviewed and stable and Patient moving all extremities  Post vital signs: Reviewed and stable  Last Vitals:  Vitals Value Taken Time  BP 117/52 12/19/19 1436  Temp    Pulse 92 12/19/19 1438  Resp 6 12/19/19 1438  SpO2 97 % 12/19/19 1438  Vitals shown include unvalidated device data.  Last Pain:  Vitals:   12/19/19 1009  TempSrc:   PainSc: 0-No pain      Patients Stated Pain Goal: 3 (41/42/39 5320)  Complications: No apparent anesthesia complications

## 2019-12-19 NOTE — Anesthesia Procedure Notes (Signed)
Procedure Name: MAC Date/Time: 12/19/2019 1:45 PM Performed by: Amadeo Garnet, CRNA Pre-anesthesia Checklist: Patient identified, Emergency Drugs available, Suction available and Patient being monitored Patient Re-evaluated:Patient Re-evaluated prior to induction Oxygen Delivery Method: Nasal cannula Preoxygenation: Pre-oxygenation with 100% oxygen Induction Type: IV induction Placement Confirmation: positive ETCO2 Dental Injury: Teeth and Oropharynx as per pre-operative assessment

## 2019-12-20 LAB — PROTEIN ELECTROPHORESIS, SERUM
A/G Ratio: 1 (ref 0.7–1.7)
Albumin ELP: 2.6 g/dL — ABNORMAL LOW (ref 2.9–4.4)
Alpha-1-Globulin: 0.3 g/dL (ref 0.0–0.4)
Alpha-2-Globulin: 0.8 g/dL (ref 0.4–1.0)
Beta Globulin: 0.9 g/dL (ref 0.7–1.3)
Gamma Globulin: 0.6 g/dL (ref 0.4–1.8)
Globulin, Total: 2.5 g/dL (ref 2.2–3.9)
Total Protein ELP: 5.1 g/dL — ABNORMAL LOW (ref 6.0–8.5)

## 2019-12-22 ENCOUNTER — Other Ambulatory Visit: Payer: Self-pay

## 2019-12-22 ENCOUNTER — Encounter (HOSPITAL_COMMUNITY): Payer: Self-pay | Admitting: *Deleted

## 2019-12-22 ENCOUNTER — Inpatient Hospital Stay (HOSPITAL_COMMUNITY): Payer: Medicare Other

## 2019-12-22 ENCOUNTER — Encounter (HOSPITAL_COMMUNITY): Payer: Self-pay

## 2019-12-22 VITALS — BP 103/46 | HR 97 | Temp 98.5°F | Resp 18 | Wt 192.6 lb

## 2019-12-22 DIAGNOSIS — C9000 Multiple myeloma not having achieved remission: Secondary | ICD-10-CM

## 2019-12-22 DIAGNOSIS — Z5112 Encounter for antineoplastic immunotherapy: Secondary | ICD-10-CM | POA: Diagnosis not present

## 2019-12-22 LAB — COMPREHENSIVE METABOLIC PANEL
ALT: 13 U/L (ref 0–44)
AST: 45 U/L — ABNORMAL HIGH (ref 15–41)
Albumin: 2.6 g/dL — ABNORMAL LOW (ref 3.5–5.0)
Alkaline Phosphatase: 115 U/L (ref 38–126)
Anion gap: 12 (ref 5–15)
BUN: 13 mg/dL (ref 8–23)
CO2: 29 mmol/L (ref 22–32)
Calcium: 8.1 mg/dL — ABNORMAL LOW (ref 8.9–10.3)
Chloride: 94 mmol/L — ABNORMAL LOW (ref 98–111)
Creatinine, Ser: 2.89 mg/dL — ABNORMAL HIGH (ref 0.44–1.00)
GFR calc Af Amer: 18 mL/min — ABNORMAL LOW (ref >60)
GFR calc non Af Amer: 15 mL/min — ABNORMAL LOW (ref >60)
Glucose, Bld: 104 mg/dL — ABNORMAL HIGH (ref 70–99)
Potassium: 3.2 mmol/L — ABNORMAL LOW (ref 3.5–5.1)
Sodium: 135 mmol/L (ref 135–145)
Total Bilirubin: 0.6 mg/dL (ref 0.3–1.2)
Total Protein: 5.5 g/dL — ABNORMAL LOW (ref 6.5–8.1)

## 2019-12-22 LAB — CBC WITH DIFFERENTIAL/PLATELET
Abs Immature Granulocytes: 0.02 10*3/uL (ref 0.00–0.07)
Basophils Absolute: 0.1 10*3/uL (ref 0.0–0.1)
Basophils Relative: 2 %
Eosinophils Absolute: 0.1 10*3/uL (ref 0.0–0.5)
Eosinophils Relative: 2 %
HCT: 32.9 % — ABNORMAL LOW (ref 36.0–46.0)
Hemoglobin: 11 g/dL — ABNORMAL LOW (ref 12.0–15.0)
Immature Granulocytes: 0 %
Lymphocytes Relative: 20 %
Lymphs Abs: 1.2 10*3/uL (ref 0.7–4.0)
MCH: 35.6 pg — ABNORMAL HIGH (ref 26.0–34.0)
MCHC: 33.4 g/dL (ref 30.0–36.0)
MCV: 106.5 fL — ABNORMAL HIGH (ref 80.0–100.0)
Monocytes Absolute: 1.1 10*3/uL — ABNORMAL HIGH (ref 0.1–1.0)
Monocytes Relative: 19 %
Neutro Abs: 3.5 10*3/uL (ref 1.7–7.7)
Neutrophils Relative %: 57 %
Platelets: 188 10*3/uL (ref 150–400)
RBC: 3.09 MIL/uL — ABNORMAL LOW (ref 3.87–5.11)
RDW: 18.4 % — ABNORMAL HIGH (ref 11.5–15.5)
WBC: 6.1 10*3/uL (ref 4.0–10.5)
nRBC: 1.3 % — ABNORMAL HIGH (ref 0.0–0.2)

## 2019-12-22 MED ORDER — SODIUM CHLORIDE 0.9 % IV SOLN
225.0000 mg/m2 | Freq: Once | INTRAVENOUS | Status: AC
  Administered 2019-12-22: 14:00:00 460 mg via INTRAVENOUS
  Filled 2019-12-22: qty 23

## 2019-12-22 MED ORDER — BORTEZOMIB CHEMO SQ INJECTION 3.5 MG (2.5MG/ML)
1.5000 mg/m2 | Freq: Once | INTRAMUSCULAR | Status: AC
  Administered 2019-12-22: 3 mg via SUBCUTANEOUS
  Filled 2019-12-22: qty 1.2

## 2019-12-22 MED ORDER — PALONOSETRON HCL INJECTION 0.25 MG/5ML
INTRAVENOUS | Status: AC
  Filled 2019-12-22: qty 5

## 2019-12-22 MED ORDER — SODIUM CHLORIDE 0.9 % IV SOLN: Freq: Once | INTRAVENOUS | Status: AC

## 2019-12-22 MED ORDER — PALONOSETRON HCL INJECTION 0.25 MG/5ML
0.2500 mg | Freq: Once | INTRAVENOUS | Status: AC
  Administered 2019-12-22: 0.25 mg via INTRAVENOUS

## 2019-12-22 NOTE — Progress Notes (Signed)
1250 Labs reviewed with RNester NP and pt approved for Cytoxan infusion and Velcade injection today per NP                     Sophia Mcmahon tolerated Cytoxan infusion and Velcade injection well without complaints or incident. Peripheral IV site checked by 2 RN's with positive blood return noted prior to and after infusion. VSS upon discharge. Pt discharged self ambulatory using her cane in satisfactory condition

## 2019-12-22 NOTE — Patient Instructions (Signed)
[facility] Discharge Instructions for Patients Receiving Chemotherapy   Beginning January 23rd 2017 lab work for the [facility] will be done in the  Main lab at [facility] on 1st floor. If you have a lab appointment with the [facility] please come in thru the  Main Entrance and check in at the main information desk   Today you received the following chemotherapy agents Cytoxan and Velcade. Follow-up as scheduled. Call clinic for any questions or concerns  To help prevent nausea and vomiting after your treatment, we encourage you to take your nausea medication   If you develop nausea and vomiting, or diarrhea that is not controlled by your medication, call the clinic.  The clinic phone number is [phone]. Office hours are Monday-Friday 8:30am-5:00pm.  BELOW ARE SYMPTOMS THAT SHOULD BE REPORTED IMMEDIATELY:  *FEVER GREATER THAN 101.0 F  *CHILLS WITH OR WITHOUT FEVER  NAUSEA AND VOMITING THAT IS NOT CONTROLLED WITH YOUR NAUSEA MEDICATION  *UNUSUAL SHORTNESS OF BREATH  *UNUSUAL BRUISING OR BLEEDING  TENDERNESS IN MOUTH AND THROAT WITH OR WITHOUT PRESENCE OF ULCERS  *URINARY PROBLEMS  *BOWEL PROBLEMS  UNUSUAL RASH Items with * indicate a potential emergency and should be followed up as soon as possible. If you have an emergency after office hours please contact your primary care physician or go to the nearest emergency department.  Please call the clinic during office hours if you have any questions or concerns.   You may also contact the Patient Navigator at [phone] should you have any questions or need assistance in obtaining follow up care.      Resources For Cancer Patients and their Caregivers ? American Cancer Society: Can assist with transportation, wigs, general needs, runs Look Good Feel Better.        (774)320-2659 ? Cancer Care: Provides financial assistance, online support groups, medication/co-pay assistance.   1-800-813-HOPE (551)508-6383) ? [place] Assists [place] cancer patients and their families through emotional , educational and financial support.  732 545 1996 ? [place] DSS Where to apply for food stamps, Medicaid and utility assistance. 361-612-0044 ? [employer]: Transportation to medical appointments. 3345746168 ? Social Security Administration: May apply for disability if have a Stage IV cancer. 2151768753 [phone] ? [employer], Disability and Transit Services: Assists with nutrition, care and transit needs. (669)393-9744

## 2019-12-29 ENCOUNTER — Inpatient Hospital Stay (HOSPITAL_COMMUNITY): Payer: Medicare Other

## 2019-12-29 ENCOUNTER — Inpatient Hospital Stay (HOSPITAL_COMMUNITY): Payer: Medicare Other | Attending: Hematology

## 2019-12-29 ENCOUNTER — Other Ambulatory Visit: Payer: Self-pay

## 2019-12-29 ENCOUNTER — Other Ambulatory Visit (HOSPITAL_COMMUNITY)
Admission: RE | Admit: 2019-12-29 | Discharge: 2019-12-29 | Disposition: A | Discharge status: Home / Self Care | Payer: Medicare Other | Source: Ambulatory Visit | Attending: Family Medicine | Admitting: Family Medicine

## 2019-12-29 ENCOUNTER — Inpatient Hospital Stay (HOSPITAL_BASED_OUTPATIENT_CLINIC_OR_DEPARTMENT_OTHER): Payer: Medicare Other | Admitting: Hematology

## 2019-12-29 ENCOUNTER — Encounter (HOSPITAL_COMMUNITY): Payer: Self-pay | Admitting: Hematology

## 2019-12-29 VITALS — BP 105/52 | HR 92 | Temp 96.8°F | Resp 18

## 2019-12-29 DIAGNOSIS — E559 Vitamin D deficiency, unspecified: Secondary | ICD-10-CM | POA: Diagnosis not present

## 2019-12-29 DIAGNOSIS — C9000 Multiple myeloma not having achieved remission: Secondary | ICD-10-CM

## 2019-12-29 DIAGNOSIS — Z5111 Encounter for antineoplastic chemotherapy: Secondary | ICD-10-CM | POA: Insufficient documentation

## 2019-12-29 DIAGNOSIS — I10 Essential (primary) hypertension: Secondary | ICD-10-CM | POA: Insufficient documentation

## 2019-12-29 DIAGNOSIS — Z5112 Encounter for antineoplastic immunotherapy: Secondary | ICD-10-CM | POA: Diagnosis not present

## 2019-12-29 LAB — CBC WITH DIFFERENTIAL/PLATELET
Abs Immature Granulocytes: 0.02 10*3/uL (ref 0.00–0.07)
Abs Immature Granulocytes: 0.02 10*3/uL (ref 0.00–0.07)
Basophils Absolute: 0.1 10*3/uL (ref 0.0–0.1)
Basophils Absolute: 0.1 10*3/uL (ref 0.0–0.1)
Basophils Relative: 1 %
Basophils Relative: 1 %
Eosinophils Absolute: 0.1 10*3/uL (ref 0.0–0.5)
Eosinophils Absolute: 0.1 10*3/uL (ref 0.0–0.5)
Eosinophils Relative: 1 %
Eosinophils Relative: 1 %
HCT: 32.8 % — ABNORMAL LOW (ref 36.0–46.0)
HCT: 33.1 % — ABNORMAL LOW (ref 36.0–46.0)
Hemoglobin: 11.1 g/dL — ABNORMAL LOW (ref 12.0–15.0)
Hemoglobin: 11.1 g/dL — ABNORMAL LOW (ref 12.0–15.0)
Immature Granulocytes: 0 %
Immature Granulocytes: 0 %
Lymphocytes Relative: 23 %
Lymphocytes Relative: 25 %
Lymphs Abs: 1.2 10*3/uL (ref 0.7–4.0)
Lymphs Abs: 1.4 10*3/uL (ref 0.7–4.0)
MCH: 36.2 pg — ABNORMAL HIGH (ref 26.0–34.0)
MCH: 35.8 pg — ABNORMAL HIGH (ref 26.0–34.0)
MCHC: 33.8 g/dL (ref 30.0–36.0)
MCHC: 33.5 g/dL (ref 30.0–36.0)
MCV: 106.8 fL — ABNORMAL HIGH (ref 80.0–100.0)
MCV: 106.8 fL — ABNORMAL HIGH (ref 80.0–100.0)
Monocytes Absolute: 1 10*3/uL (ref 0.1–1.0)
Monocytes Absolute: 1.1 10*3/uL — ABNORMAL HIGH (ref 0.1–1.0)
Monocytes Relative: 19 %
Monocytes Relative: 19 %
Neutro Abs: 3 10*3/uL (ref 1.7–7.7)
Neutro Abs: 3 10*3/uL (ref 1.7–7.7)
Neutrophils Relative %: 56 %
Neutrophils Relative %: 54 %
Platelets: 169 10*3/uL (ref 150–400)
Platelets: 168 10*3/uL (ref 150–400)
RBC: 3.07 MIL/uL — ABNORMAL LOW (ref 3.87–5.11)
RBC: 3.1 MIL/uL — ABNORMAL LOW (ref 3.87–5.11)
RDW: 19.2 % — ABNORMAL HIGH (ref 11.5–15.5)
RDW: 19 % — ABNORMAL HIGH (ref 11.5–15.5)
WBC: 5.4 10*3/uL (ref 4.0–10.5)
WBC: 5.6 10*3/uL (ref 4.0–10.5)
nRBC: 2.6 % — ABNORMAL HIGH (ref 0.0–0.2)
nRBC: 2 % — ABNORMAL HIGH (ref 0.0–0.2)

## 2019-12-29 LAB — COMPREHENSIVE METABOLIC PANEL
ALT: 18 U/L (ref 0–44)
ALT: 17 U/L (ref 0–44)
AST: 42 U/L — ABNORMAL HIGH (ref 15–41)
AST: 41 U/L (ref 15–41)
Albumin: 2.4 g/dL — ABNORMAL LOW (ref 3.5–5.0)
Albumin: 2.5 g/dL — ABNORMAL LOW (ref 3.5–5.0)
Alkaline Phosphatase: 116 U/L (ref 38–126)
Alkaline Phosphatase: 113 U/L (ref 38–126)
Anion gap: 8 (ref 5–15)
Anion gap: 7 (ref 5–15)
BUN: 14 mg/dL (ref 8–23)
BUN: 13 mg/dL (ref 8–23)
CO2: 30 mmol/L (ref 22–32)
CO2: 31 mmol/L (ref 22–32)
Calcium: 7.8 mg/dL — ABNORMAL LOW (ref 8.9–10.3)
Calcium: 8 mg/dL — ABNORMAL LOW (ref 8.9–10.3)
Chloride: 96 mmol/L — ABNORMAL LOW (ref 98–111)
Chloride: 98 mmol/L (ref 98–111)
Creatinine, Ser: 3.07 mg/dL — ABNORMAL HIGH (ref 0.44–1.00)
Creatinine, Ser: 3.06 mg/dL — ABNORMAL HIGH (ref 0.44–1.00)
GFR calc Af Amer: 16 mL/min — ABNORMAL LOW (ref >60)
GFR calc Af Amer: 16 mL/min — ABNORMAL LOW (ref >60)
GFR calc non Af Amer: 14 mL/min — ABNORMAL LOW (ref >60)
GFR calc non Af Amer: 14 mL/min — ABNORMAL LOW (ref >60)
Glucose, Bld: 139 mg/dL — ABNORMAL HIGH (ref 70–99)
Glucose, Bld: 134 mg/dL — ABNORMAL HIGH (ref 70–99)
Potassium: 3.6 mmol/L (ref 3.5–5.1)
Potassium: 3.6 mmol/L (ref 3.5–5.1)
Sodium: 134 mmol/L — ABNORMAL LOW (ref 135–145)
Sodium: 136 mmol/L (ref 135–145)
Total Bilirubin: 0.6 mg/dL (ref 0.3–1.2)
Total Bilirubin: 0.5 mg/dL (ref 0.3–1.2)
Total Protein: 5.2 g/dL — ABNORMAL LOW (ref 6.5–8.1)
Total Protein: 5.2 g/dL — ABNORMAL LOW (ref 6.5–8.1)

## 2019-12-29 LAB — T4, FREE: Free T4: 0.72 ng/dL (ref 0.61–1.12)

## 2019-12-29 LAB — TSH: TSH: 3.036 u[IU]/mL (ref 0.350–4.500)

## 2019-12-29 LAB — VITAMIN D 25 HYDROXY (VIT D DEFICIENCY, FRACTURES): Vit D, 25-Hydroxy: 13.05 ng/mL — ABNORMAL LOW (ref 30–100)

## 2019-12-29 MED ORDER — PALONOSETRON HCL INJECTION 0.25 MG/5ML
INTRAVENOUS | Status: AC
  Filled 2019-12-29: qty 5

## 2019-12-29 MED ORDER — SODIUM CHLORIDE 0.9 % IV SOLN
225.0000 mg/m2 | Freq: Once | INTRAVENOUS | Status: AC
  Administered 2019-12-29: 460 mg via INTRAVENOUS
  Filled 2019-12-29: qty 23

## 2019-12-29 MED ORDER — PALONOSETRON HCL INJECTION 0.25 MG/5ML
0.2500 mg | Freq: Once | INTRAVENOUS | Status: AC
  Administered 2019-12-29: 0.25 mg via INTRAVENOUS

## 2019-12-29 MED ORDER — BORTEZOMIB CHEMO SQ INJECTION 3.5 MG (2.5MG/ML)
1.5000 mg/m2 | Freq: Once | INTRAMUSCULAR | Status: AC
  Administered 2019-12-29: 3 mg via SUBCUTANEOUS
  Filled 2019-12-29: qty 1.2

## 2019-12-29 MED ORDER — SODIUM CHLORIDE 0.9 % IV SOLN: Freq: Once | INTRAVENOUS | Status: AC

## 2019-12-29 NOTE — Assessment & Plan Note (Addendum)
1. Lambda Light Chain Disease: -Presented to the emergency room 09/21/2019 with acute on chronic renal failure. -Multiple myeloma labs completed on 09/22/2019 with negative SPEP, kappa free light chain 74.6, lambda free light chains 1062, ratio 0.07.  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  24-hour urine total protein was 5602 mg. -Bone marrow biopsy completed on 09/30/2019: Hypercellular marrow with 14% plasma cell, weak lambda restriction. FISH: No high risk gene mutations.  Congo red staining positive for small foci of amyloid deposits.  Chromosome analysis was 42, XX. -Skeletal survey negative for lytic lesions. -Of note echocardiogram was completed on 09/22/2019 which showed an ejection fraction of 65 to 70% with severely increased left ventricular hypertrophy.  This could be due to uncontrolled hypertension or possible amyloid disease. -Cycle 1 of CyBorD on 10/21/2019. - PET/CT was performed at [facility] in [place] on 10/19/2019 and was negative for any lytic lesions or FDG activity. -Abdominal fat pad biopsy on 10/24/2019 showed benign adipose tissue.  Congo red stain was also negative for amyloid. -BNP elevated at 1086.  Troponin T was 0.308 (less than 0.011). -Immunofixation positive for presence of monoclonal free lambda light chain.  Beta-2 microglobulin was 20.8. -As per my conversation with Dr. Haroldine Laws, she has both right and left ventricle enlarged, mostly consistent with amyloidosis rather than hypertension.  Hence we abandoned cardiac MRI. -We reviewed myeloma panel from 12/15/2019.  Lambda light chains improved to 595.  There were 710 previously and 1062 prior to start of therapy. -She is tolerating Velcade and cyclophosphamide very well.  I have reviewed her labs today.  She will proceed with her next cycle today. -She reports that her energy levels are normal at this time. -I will see her back in 4 weeks with repeat myeloma panel.  2.  ID  prophylaxis: -She is continuing acyclovir for shingles prophylaxis.  3.  ESRD on HD: -He undergoes hemodialysis on Tuesday, Thursday and Saturday in [place].

## 2019-12-29 NOTE — Patient Instructions (Signed)
[facility] Discharge Instructions for Patients Receiving Chemotherapy  Today you received the following chemotherapy agents   To help prevent nausea and vomiting after your treatment, we encourage you to take your nausea medication   If you develop nausea and vomiting that is not controlled by your nausea medication, call the clinic.   BELOW ARE SYMPTOMS THAT SHOULD BE REPORTED IMMEDIATELY:  *FEVER GREATER THAN 100.5 F  *CHILLS WITH OR WITHOUT FEVER  NAUSEA AND VOMITING THAT IS NOT CONTROLLED WITH YOUR NAUSEA MEDICATION  *UNUSUAL SHORTNESS OF BREATH  *UNUSUAL BRUISING OR BLEEDING  TENDERNESS IN MOUTH AND THROAT WITH OR WITHOUT PRESENCE OF ULCERS  *URINARY PROBLEMS  *BOWEL PROBLEMS  UNUSUAL RASH Items with * indicate a potential emergency and should be followed up as soon as possible.  Feel free to call the clinic should you have any questions or concerns. The clinic phone number is [phone].  Please show the CHEMO ALERT CARD at check-in to the Emergency Department and triage nurse.   

## 2019-12-29 NOTE — Patient Instructions (Addendum)
[place] at [facility] Discharge Instructions  You were seen today by Dr. Delton Coombes. He went over your recent lab results. Continue weekly treatments. He will see you back in 4 weeks for labs, treatment and follow up.   Thank you for choosing [place] at [facility] to provide your oncology and hematology care.  To afford each patient quality time with our provider, please arrive at least 15 minutes before your scheduled appointment time.   If you have a lab appointment with the [facility] please come in thru the  Main Entrance and check in at the main information desk  You need to re-schedule your appointment should you arrive 10 or more minutes late.  We strive to give you quality time with our providers, and arriving late affects you and other patients whose appointments are after yours.  Also, if you no show three or more times for appointments you may be dismissed from the clinic at the providers discretion.     Again, thank you for choosing [facility].  Our hope is that these requests will decrease the amount of time that you wait before being seen by our physicians.       _____________________________________________________________  Should you have questions after your visit to [facility], please contact our office at [phone] between the hours of 8:00 a.m. and 4:30 p.m.  Voicemails left after 4:00 p.m. will not be returned until the following business day.  For prescription refill requests, have your pharmacy contact our office and allow 72 hours.    Cancer Center Support Programs:   > Cancer Support Group  2nd Tuesday of the month 1pm-2pm, Journey Room

## 2019-12-29 NOTE — Progress Notes (Signed)
[place], [place] [address]   CLINIC:  Medical Oncology/Hematology  PCP:  Frances Maywood, [place] [address] [phone]   REASON FOR VISIT:  Follow-up for lambda light chain disease.  CURRENT THERAPY: CyBorD.  BRIEF ONCOLOGIC HISTORY:  Oncology History  Multiple myeloma ([place])  09/30/2019 Initial Diagnosis   Multiple myeloma ([place])   10/21/2019 -  Chemotherapy   The patient had palonosetron (ALOXI) injection 0.25 mg, 0.25 mg, Intravenous,  Once, 3 of 4 cycles Administration: 0.25 mg (10/21/2019), 0.25 mg (10/28/2019), 0.25 mg (11/04/2019), 0.25 mg (11/11/2019), 0.25 mg (11/21/2019), 0.25 mg (12/01/2019), 0.25 mg (12/08/2019), 0.25 mg (12/15/2019), 0.25 mg (12/22/2019), 0.25 mg (12/29/2019) bortezomib SQ (VELCADE) chemo injection 3 mg, 1.5 mg/m2 = 3 mg, Subcutaneous,  Once, 3 of 4 cycles Administration: 3 mg (10/21/2019), 3 mg (10/28/2019), 3 mg (11/04/2019), 3 mg (11/11/2019), 3 mg (11/21/2019), 3 mg (12/01/2019), 3 mg (12/08/2019), 3 mg (12/15/2019), 3 mg (12/22/2019), 3 mg (12/29/2019) cyclophosphamide (CYTOXAN) 300 mg in sodium chloride 0.9 % 250 mL chemo infusion, 150 mg/m2 = 300 mg (50 % of original dose 300 mg/m2), Intravenous,  Once, 3 of 4 cycles Dose modification: 150 mg/m2 (50 % of original dose 300 mg/m2, Cycle 1, Reason: Other (see comments), Comment: Hemodialysis, will increase to 246m/m2 weekly), 150 mg/m2 (original dose 300 mg/m2, Cycle 1, Reason: Provider Judgment, Comment: creatinine elevated on dialysis), 225 mg/m2 (original dose 300 mg/m2, Cycle 2, Reason: Provider Judgment) Administration: 300 mg (10/21/2019), 300 mg (10/28/2019), 300 mg (11/04/2019), 300 mg (11/11/2019), 460 mg (11/21/2019), 460 mg (12/01/2019), 460 mg (12/08/2019), 460 mg (12/15/2019), 460 mg (12/22/2019), 460 mg (12/29/2019)  for chemotherapy treatment.       CANCER STAGING: Cancer Staging No matching staging information was found  for the patient.   INTERVAL HISTORY:  Ms. [id number]. female seen for follow-up of lambda light chain disease.  She is undergoing hemodialysis on Tuesday Thursday and Saturday.  She is tolerating dialysis very well.  She is also tolerating Velcade and cyclophosphamide very well.  Denies any nausea, vomiting, diarrhea or constipation.  Appetite is 100%.  Energy levels are 75%.  No new pains reported.   REVIEW OF SYSTEMS:  Review of Systems  Cardiovascular: Positive for leg swelling.  All other systems reviewed and are negative.    PAST MEDICAL/SURGICAL HISTORY:  Past Medical History:  Diagnosis Date  . Anemia   . Arthritis   . CH[place] ([place]estive heart failure) ([facility]   . Coronary artery disease   . GERD (gastroesophageal reflux disease)   . High cholesterol   . Hypertension   . Hypothyroidism   . Renal disorder   . Seizures ([facility]   . Sleep apnea    Past Surgical History:  Procedure Laterality Date  . ABDOMINAL HYSTERECTOMY    . AV FISTULA PLACEMENT Left 10/07/2019   Procedure: ARTERIOVENOUS (AV)  BRACHIOCEPHALIC FISTULA CREATION;  Surgeon: CMarty Heck MD;  [facility]  Service: Vascular;  Laterality: Left;  . BASCILIC VEIN TRANSPOSITION Left 12/19/2019   Procedure: FIRST STAGE BASILIC VEIN TRANSPOSITION LEFT ARM;  Surgeon: CMarty Heck MD;  [facility]  Service: Vascular;  Laterality: Left;  . BRAIN SURGERY     benign tumor removal  . EXCHANGE OF A DIALYSIS CATHETER Right 10/07/2019   Procedure: EXCHANGE OF A DIALYSIS CATHETER;  Surgeon: CMarty Heck MD;  [facility]  Service: Vascular;  Laterality: Right;  . IR BONE  MARROW BIOPSY  09/30/2019     SOCIAL HISTORY:  Social History   Socioeconomic History  . Marital status: Widowed    Spouse name: Not on file  . Number of children: 4  . Years of education: Not on file  . Highest education level: Not on file  Occupational History  . Occupation: reitred  Tobacco Use  . Smoking  status: Never Smoker  . Smokeless tobacco: Never Used  Substance and Sexual Activity  . Alcohol use: Never  . Drug use: Never  . Sexual activity: Not Currently  Other Topics Concern  . Not on file  Social History Narrative  . Not on file   Social Determinants of Health   Financial Resource Strain: Low Risk   . Difficulty of Paying Living Expenses: Not hard at all  Food Insecurity: No Food Insecurity  . Worried About Charity fundraiser in the Last Year: Never true  . Ran Out of Food in the Last Year: Never true  Transportation Needs: No Transportation Needs  . Lack of Transportation (Medical): No  . Lack of Transportation (Non-Medical): No  Physical Activity: Sufficiently Active  . Days of Exercise per Week: 7 days  . Minutes of Exercise per Session: 30 min  Stress: No Stress Concern Present  . Feeling of Stress : Only a little  Social Connections: Slightly Isolated  . Frequency of Communication with Friends and Family: More than three times a week  . Frequency of Social Gatherings with Friends and Family: More than three times a week  . Attends Religious Services: More than 4 times per year  . Active Member of Clubs or Organizations: Yes  . Attends Archivist Meetings: More than 4 times per year  . Marital Status: Widowed  Intimate Partner Violence: Not At Risk  . Fear of Current or Ex-Partner: No  . Emotionally Abused: No  . Physically Abused: No  . Sexually Abused: No    FAMILY HISTORY:  Family History  Problem Relation Age of Onset  . Cancer Mother   . Cancer Sister   . Hypertension Father   . Cancer Brother   . Alcohol abuse Brother   . Hypertension Son   . Heart attack Son     CURRENT MEDICATIONS:  Outpatient Encounter Medications as of 12/29/2019  Medication Sig  . acyclovir (ZOVIRAX) 400 MG tablet Take 1 tablet (400 mg total) by mouth 2 (two) times daily.  Marland Kitchen atorvastatin (LIPITOR) 80 MG tablet Take 80 mg by mouth daily.  . bortezomib IV  (VELCADE) 3.5 MG injection Inject into the vein every Thursday.   . CYCLOPHOSPHAMIDE IV Inject 1 Dose into the vein every Thursday.   Marland Kitchen dexamethasone (DECADRON) 4 MG tablet Take 5 tablets (20 mg) on days 1, 8, 15, and 22 of chemo. Repeat every 28 days. Take 2 tablets (33m) daily for 2 days after days 1 and 8 of chemo. (Patient taking differently: Take 8-20 mg by mouth See admin instructions. Take 20 mg by mouth every Thursday of the month during chemo treatments, repeat every 28 days. Take 8 mg daily for 2 days after the first and second treatment of the month)  . docusate sodium (COLACE) 100 MG capsule Take 100 mg by mouth 2 (two) times daily.   . mometasone (NASONEX) 50 MCG/ACT nasal spray Place 1 spray into the nose 2 (two) times daily.   .Marland Kitchenomeprazole (PRILOSEC) 40 MG capsule Take 40 mg by mouth daily.  . phenytoin (DILANTIN) 100 MG  ER capsule Take 300 mg by mouth at bedtime.   . torsemide (DEMADEX) 100 MG tablet Take 50 mg by mouth 2 (two) times daily.   Marland Kitchen HYDROcodone-acetaminophen (NORCO/VICODIN) 5-325 MG tablet Take 1 tablet by mouth every 4 (four) hours as needed for moderate pain. (Patient not taking: Reported on 12/29/2019)  . prochlorperazine (COMPAZINE) 10 MG tablet Take 1 tablet (10 mg total) by mouth every 6 (six) hours as needed (Nausea or vomiting). (Patient not taking: Reported on 12/29/2019)   No facility-administered encounter medications on file as of 12/29/2019.    ALLERGIES:  Allergies  Allergen Reactions  . Sulfa Antibiotics Hives     PHYSICAL EXAM:  ECOG Performance status: 1  Vitals:   12/29/19 1210  BP: (!) 105/49  Pulse: 100  Resp: 18  Temp: (!) 97.1 F (36.2 C)  SpO2: 98%   Filed Weights   12/29/19 1210  Weight: 195 lb (88.5 kg)    Physical Exam Vitals reviewed.  Constitutional:      Appearance: Normal appearance.  Cardiovascular:     Rate and Rhythm: Normal rate and regular rhythm.     Heart sounds: Normal heart sounds.  Pulmonary:     Effort:  Pulmonary effort is normal.     Breath sounds: Normal breath sounds.  Abdominal:     General: There is no distension.     Palpations: Abdomen is soft. There is no mass.  Musculoskeletal:        General: Swelling present.     Right lower leg: Edema present.     Left lower leg: Edema present.  Skin:    General: Skin is warm.  Neurological:     General: No focal deficit present.     Mental Status: She is alert and oriented to person, place, and time.  Psychiatric:        Mood and Affect: Mood normal.        Behavior: Behavior normal.      LABORATORY DATA:  I have reviewed the labs as listed.  CBC    Component Value Date/Time   WBC 5.6 12/29/2019 1310   RBC 3.10 (L) 12/29/2019 1310   HGB 11.1 (L) 12/29/2019 1310   HCT 33.1 (L) 12/29/2019 1310   HCT 29.7 (L) 09/22/2019 0422   PLT 168 12/29/2019 1310   MCV 106.8 (H) 12/29/2019 1310   MCH 35.8 (H) 12/29/2019 1310   MCHC 33.5 12/29/2019 1310   RDW 19.0 (H) 12/29/2019 1310   LYMPHSABS 1.4 12/29/2019 1310   MONOABS 1.1 (H) 12/29/2019 1310   EOSABS 0.1 12/29/2019 1310   BASOSABS 0.1 12/29/2019 1310   CMP Latest Ref Rng & Units 12/29/2019 12/29/2019 12/22/2019  Glucose 70 - 99 mg/dL 134(H) 139(H) 104(H)  BUN 8 - 23 mg/dL 13 14 13   Creatinine 0.44 - 1.00 mg/dL 3.06(H) 3.07(H) 2.89(H)  Sodium 135 - 145 mmol/L 136 134(L) 135  Potassium 3.5 - 5.1 mmol/L 3.6 3.6 3.2(L)  Chloride 98 - 111 mmol/L 98 96(L) 94(L)  CO2 22 - 32 mmol/L 31 30 29   Calcium 8.9 - 10.3 mg/dL 8.0(L) 7.8(L) 8.1(L)  Total Protein 6.5 - 8.1 g/dL 5.2(L) 5.2(L) 5.5(L)  Total Bilirubin 0.3 - 1.2 mg/dL 0.5 0.6 0.6  Alkaline Phos 38 - 126 U/L 113 116 115  AST 15 - 41 U/L 41 42(H) 45(H)  ALT 0 - 44 U/L 17 18 13        DIAGNOSTIC IMAGING:  I have independently reviewed the scans and discussed with the patient.  ASSESSMENT & PLAN:   Multiple myeloma ([place]) 1. Lambda Light Chain Disease: -Presented to the emergency room 09/21/2019 with acute on chronic  renal failure. -Multiple myeloma labs completed on 09/22/2019 with negative SPEP, kappa free light chain 74.6, lambda free light chains 1062, ratio 0.07.  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  24-hour urine total protein was 5602 mg. -Bone marrow biopsy completed on 09/30/2019: Hypercellular marrow with 14% plasma cell, weak lambda restriction. FISH: No high risk gene mutations.  Congo red staining positive for small foci of amyloid deposits.  Chromosome analysis was 29, XX. -Skeletal survey negative for lytic lesions. -Of note echocardiogram was completed on 09/22/2019 which showed an ejection fraction of 65 to 70% with severely increased left ventricular hypertrophy.  This could be due to uncontrolled hypertension or possible amyloid disease. -Cycle 1 of CyBorD on 10/21/2019. - PET/CT was performed at [facility] [place] on 10/19/2019 and was negative for any lytic lesions or FDG activity. -Abdominal fat pad biopsy on 10/24/2019 showed benign adipose tissue.  Congo red stain was also negative for amyloid. -BNP elevated at 1086.  Troponin T was 0.308 (less than 0.011). -Immunofixation positive for presence of monoclonal free lambda light chain.  Beta-2 microglobulin was 20.8. -As per my conversation with Dr. Haroldine Laws, she has both right and left ventricle enlarged, mostly consistent with amyloidosis rather than hypertension.  Hence we abandoned cardiac MRI. -We reviewed myeloma panel from 12/15/2019.  Lambda light chains improved to 595.  There were 710 previously and 1062 prior to start of therapy. -She is tolerating Velcade and cyclophosphamide very well.  I have reviewed her labs today.  She will proceed with her next cycle today. -She reports that her energy levels are normal at this time. -I will see her back in 4 weeks with repeat myeloma panel.  2.  ID prophylaxis: -She is continuing acyclovir for shingles prophylaxis.  3.  ESRD on HD: -He undergoes  hemodialysis on Tuesday, Thursday and Saturday in [place].       Orders placed this encounter:  No orders of the defined types were placed in this encounter.     Derek Jack, MD [place] [phone]

## 2019-12-29 NOTE — Progress Notes (Signed)
Patient presents today for follow up appointment and treatment. Labs reviewed by Dr. Delton Coombes.  MD aware. Calcium correction 9.08. Creatinine 3.07. MD aware. Patient is on Dialysis.  Verbal order received to proceed with treatment.   Treatment given today per MD orders. Tolerated infusion without adverse affects. Vital signs stable. No complaints at this time. Discharged from clinic ambulatory. F/U with [facility] as scheduled.

## 2019-12-30 LAB — T3, FREE: T3, Free: 1.9 pg/mL — ABNORMAL LOW (ref 2.0–4.4)

## 2020-01-03 ENCOUNTER — Other Ambulatory Visit (HOSPITAL_COMMUNITY): Payer: Self-pay | Admitting: Hematology

## 2020-01-03 ENCOUNTER — Other Ambulatory Visit (HOSPITAL_COMMUNITY): Payer: Medicare Other

## 2020-01-05 ENCOUNTER — Inpatient Hospital Stay (HOSPITAL_COMMUNITY): Payer: Medicare Other

## 2020-01-05 ENCOUNTER — Encounter (HOSPITAL_COMMUNITY): Payer: Self-pay

## 2020-01-05 ENCOUNTER — Other Ambulatory Visit: Payer: Self-pay

## 2020-01-05 VITALS — BP 124/58 | HR 92 | Temp 98.5°F | Resp 18 | Wt 197.4 lb

## 2020-01-05 DIAGNOSIS — C9000 Multiple myeloma not having achieved remission: Secondary | ICD-10-CM

## 2020-01-05 DIAGNOSIS — Z5112 Encounter for antineoplastic immunotherapy: Secondary | ICD-10-CM | POA: Diagnosis not present

## 2020-01-05 LAB — COMPREHENSIVE METABOLIC PANEL
ALT: 24 U/L (ref 0–44)
AST: 43 U/L — ABNORMAL HIGH (ref 15–41)
Albumin: 2.5 g/dL — ABNORMAL LOW (ref 3.5–5.0)
Alkaline Phosphatase: 114 U/L (ref 38–126)
Anion gap: 6 (ref 5–15)
BUN: 16 mg/dL (ref 8–23)
CO2: 31 mmol/L (ref 22–32)
Calcium: 8 mg/dL — ABNORMAL LOW (ref 8.9–10.3)
Chloride: 98 mmol/L (ref 98–111)
Creatinine, Ser: 2.98 mg/dL — ABNORMAL HIGH (ref 0.44–1.00)
GFR calc Af Amer: 17 mL/min — ABNORMAL LOW (ref >60)
GFR calc non Af Amer: 15 mL/min — ABNORMAL LOW (ref >60)
Glucose, Bld: 108 mg/dL — ABNORMAL HIGH (ref 70–99)
Potassium: 3.8 mmol/L (ref 3.5–5.1)
Sodium: 135 mmol/L (ref 135–145)
Total Bilirubin: 0.6 mg/dL (ref 0.3–1.2)
Total Protein: 5.4 g/dL — ABNORMAL LOW (ref 6.5–8.1)

## 2020-01-05 LAB — CBC WITH DIFFERENTIAL/PLATELET
Abs Immature Granulocytes: 0.02 10*3/uL (ref 0.00–0.07)
Basophils Absolute: 0.1 10*3/uL (ref 0.0–0.1)
Basophils Relative: 1 %
Eosinophils Absolute: 0.1 10*3/uL (ref 0.0–0.5)
Eosinophils Relative: 1 %
HCT: 31.7 % — ABNORMAL LOW (ref 36.0–46.0)
Hemoglobin: 10.9 g/dL — ABNORMAL LOW (ref 12.0–15.0)
Immature Granulocytes: 0 %
Lymphocytes Relative: 17 %
Lymphs Abs: 1 10*3/uL (ref 0.7–4.0)
MCH: 37.1 pg — ABNORMAL HIGH (ref 26.0–34.0)
MCHC: 34.4 g/dL (ref 30.0–36.0)
MCV: 107.8 fL — ABNORMAL HIGH (ref 80.0–100.0)
Monocytes Absolute: 1 10*3/uL (ref 0.1–1.0)
Monocytes Relative: 16 %
Neutro Abs: 4 10*3/uL (ref 1.7–7.7)
Neutrophils Relative %: 65 %
Platelets: 182 10*3/uL (ref 150–400)
RBC: 2.94 MIL/uL — ABNORMAL LOW (ref 3.87–5.11)
RDW: 19.6 % — ABNORMAL HIGH (ref 11.5–15.5)
WBC: 6.1 10*3/uL (ref 4.0–10.5)
nRBC: 1.1 % — ABNORMAL HIGH (ref 0.0–0.2)

## 2020-01-05 MED ORDER — SODIUM CHLORIDE 0.9 % IV SOLN: Freq: Once | INTRAVENOUS | Status: AC

## 2020-01-05 MED ORDER — SODIUM CHLORIDE 0.9 % IV SOLN
225.0000 mg/m2 | Freq: Once | INTRAVENOUS | Status: AC
  Administered 2020-01-05: 460 mg via INTRAVENOUS
  Filled 2020-01-05: qty 23

## 2020-01-05 MED ORDER — BORTEZOMIB CHEMO SQ INJECTION 3.5 MG (2.5MG/ML)
1.5000 mg/m2 | Freq: Once | INTRAMUSCULAR | Status: AC
  Administered 2020-01-05: 3 mg via SUBCUTANEOUS
  Filled 2020-01-05: qty 1.2

## 2020-01-05 MED ORDER — PALONOSETRON HCL INJECTION 0.25 MG/5ML
0.2500 mg | Freq: Once | INTRAVENOUS | Status: AC
  Administered 2020-01-05: 14:00:00 0.25 mg via INTRAVENOUS
  Filled 2020-01-05: qty 5

## 2020-01-05 NOTE — Progress Notes (Signed)
Patient presents today for treatment . Vital signs within parameters for treatment. Labs reviewed by RNester NP and proceed with treatment order received. 2.98 Creatinine reviewed by NP.   Treatment given today per MD orders. Tolerated infusion without adverse affects. Vital signs stable. No complaints at this time. Discharged from clinic ambulatory. F/U with [facility] as scheduled.

## 2020-01-05 NOTE — Progress Notes (Signed)
Ok to treat per NP

## 2020-01-05 NOTE — Patient Instructions (Signed)
[facility] Discharge Instructions for Patients Receiving Chemotherapy  Today you received the following chemotherapy agents   To help prevent nausea and vomiting after your treatment, we encourage you to take your nausea medication   If you develop nausea and vomiting that is not controlled by your nausea medication, call the clinic.   BELOW ARE SYMPTOMS THAT SHOULD BE REPORTED IMMEDIATELY:  *FEVER GREATER THAN 100.5 F  *CHILLS WITH OR WITHOUT FEVER  NAUSEA AND VOMITING THAT IS NOT CONTROLLED WITH YOUR NAUSEA MEDICATION  *UNUSUAL SHORTNESS OF BREATH  *UNUSUAL BRUISING OR BLEEDING  TENDERNESS IN MOUTH AND THROAT WITH OR WITHOUT PRESENCE OF ULCERS  *URINARY PROBLEMS  *BOWEL PROBLEMS  UNUSUAL RASH Items with * indicate a potential emergency and should be followed up as soon as possible.  Feel free to call the clinic should you have any questions or concerns. The clinic phone number is [phone].  Please show the CHEMO ALERT CARD at check-in to the Emergency Department and triage nurse.   

## 2020-01-09 ENCOUNTER — Telehealth: Payer: Self-pay

## 2020-01-09 NOTE — Telephone Encounter (Signed)
Pt had surgery a few weeks back and she is concerned about a knot that has come up at the incision site.   Pt is having dialysis tomorrow. Asked daughter to have them evaluate the area and determine if an appt is needed or what they recommend and we will proceed from there. No issues with dialyzing per daughter and no pain.   York Cerise, CMA

## 2020-01-11 NOTE — Telephone Encounter (Signed)
Amy from [facility] dialysis called and said that patient asked them to evaluate her access yesterday and she said that the arm looked fine with no concerns. She said that the area which the patient was concerned about no longer seemed present.   York Cerise, CMA

## 2020-01-12 ENCOUNTER — Inpatient Hospital Stay (HOSPITAL_COMMUNITY): Payer: Medicare Other

## 2020-01-12 ENCOUNTER — Other Ambulatory Visit: Payer: Self-pay

## 2020-01-12 ENCOUNTER — Encounter (HOSPITAL_COMMUNITY): Payer: Self-pay

## 2020-01-12 VITALS — BP 117/56 | HR 95 | Temp 98.3°F | Resp 18 | Wt 198.0 lb

## 2020-01-12 DIAGNOSIS — C9000 Multiple myeloma not having achieved remission: Secondary | ICD-10-CM

## 2020-01-12 DIAGNOSIS — Z5112 Encounter for antineoplastic immunotherapy: Secondary | ICD-10-CM | POA: Diagnosis not present

## 2020-01-12 LAB — CBC WITH DIFFERENTIAL/PLATELET
Abs Immature Granulocytes: 0.03 10*3/uL (ref 0.00–0.07)
Basophils Absolute: 0.1 10*3/uL (ref 0.0–0.1)
Basophils Relative: 1 %
Eosinophils Absolute: 0.1 10*3/uL (ref 0.0–0.5)
Eosinophils Relative: 1 %
HCT: 32.1 % — ABNORMAL LOW (ref 36.0–46.0)
Hemoglobin: 10.8 g/dL — ABNORMAL LOW (ref 12.0–15.0)
Immature Granulocytes: 0 %
Lymphocytes Relative: 14 %
Lymphs Abs: 1.1 10*3/uL (ref 0.7–4.0)
MCH: 36.7 pg — ABNORMAL HIGH (ref 26.0–34.0)
MCHC: 33.6 g/dL (ref 30.0–36.0)
MCV: 109.2 fL — ABNORMAL HIGH (ref 80.0–100.0)
Monocytes Absolute: 1.4 10*3/uL — ABNORMAL HIGH (ref 0.1–1.0)
Monocytes Relative: 17 %
Neutro Abs: 5.2 10*3/uL (ref 1.7–7.7)
Neutrophils Relative %: 67 %
Platelets: 162 10*3/uL (ref 150–400)
RBC: 2.94 MIL/uL — ABNORMAL LOW (ref 3.87–5.11)
RDW: 19.7 % — ABNORMAL HIGH (ref 11.5–15.5)
WBC: 7.9 10*3/uL (ref 4.0–10.5)
nRBC: 1.1 % — ABNORMAL HIGH (ref 0.0–0.2)

## 2020-01-12 LAB — COMPREHENSIVE METABOLIC PANEL
ALT: 27 U/L (ref 0–44)
AST: 44 U/L — ABNORMAL HIGH (ref 15–41)
Albumin: 2.6 g/dL — ABNORMAL LOW (ref 3.5–5.0)
Alkaline Phosphatase: 117 U/L (ref 38–126)
Anion gap: 8 (ref 5–15)
BUN: 14 mg/dL (ref 8–23)
CO2: 30 mmol/L (ref 22–32)
Calcium: 7.8 mg/dL — ABNORMAL LOW (ref 8.9–10.3)
Chloride: 96 mmol/L — ABNORMAL LOW (ref 98–111)
Creatinine, Ser: 3.03 mg/dL — ABNORMAL HIGH (ref 0.44–1.00)
GFR calc Af Amer: 17 mL/min — ABNORMAL LOW (ref >60)
GFR calc non Af Amer: 14 mL/min — ABNORMAL LOW (ref >60)
Glucose, Bld: 102 mg/dL — ABNORMAL HIGH (ref 70–99)
Potassium: 4 mmol/L (ref 3.5–5.1)
Sodium: 134 mmol/L — ABNORMAL LOW (ref 135–145)
Total Bilirubin: 0.6 mg/dL (ref 0.3–1.2)
Total Protein: 5.2 g/dL — ABNORMAL LOW (ref 6.5–8.1)

## 2020-01-12 MED ORDER — PALONOSETRON HCL INJECTION 0.25 MG/5ML
0.2500 mg | Freq: Once | INTRAVENOUS | Status: AC
  Administered 2020-01-12: 13:00:00 0.25 mg via INTRAVENOUS

## 2020-01-12 MED ORDER — PALONOSETRON HCL INJECTION 0.25 MG/5ML
INTRAVENOUS | Status: AC
  Filled 2020-01-12: qty 5

## 2020-01-12 MED ORDER — BORTEZOMIB CHEMO SQ INJECTION 3.5 MG (2.5MG/ML)
1.5000 mg/m2 | Freq: Once | INTRAMUSCULAR | Status: AC
  Administered 2020-01-12: 14:00:00 3 mg via SUBCUTANEOUS
  Filled 2020-01-12: qty 1.2

## 2020-01-12 MED ORDER — SODIUM CHLORIDE 0.9 % IV SOLN
225.0000 mg/m2 | Freq: Once | INTRAVENOUS | Status: AC
  Administered 2020-01-12: 460 mg via INTRAVENOUS
  Filled 2020-01-12: qty 23

## 2020-01-12 MED ORDER — SODIUM CHLORIDE 0.9 % IV SOLN: Freq: Once | INTRAVENOUS | Status: AC

## 2020-01-12 NOTE — Patient Instructions (Signed)
[facility] Discharge Instructions for Patients Receiving Chemotherapy   Beginning January 23rd 2017 lab work for the [facility] will be done in the  Main lab at [facility] on 1st floor. If you have a lab appointment with the [facility] please come in thru the  Main Entrance and check in at the main information desk   Today you received the following chemotherapy agents Cytoxan infusion and Velcade injection. Follow-up as scheduled. Call clinic for any questions or concerns  To help prevent nausea and vomiting after your treatment, we encourage you to take your nausea medication   If you develop nausea and vomiting, or diarrhea that is not controlled by your medication, call the clinic.  The clinic phone number is [phone]. Office hours are Monday-Friday 8:30am-5:00pm.  BELOW ARE SYMPTOMS THAT SHOULD BE REPORTED IMMEDIATELY:  *FEVER GREATER THAN 101.0 F  *CHILLS WITH OR WITHOUT FEVER  NAUSEA AND VOMITING THAT IS NOT CONTROLLED WITH YOUR NAUSEA MEDICATION  *UNUSUAL SHORTNESS OF BREATH  *UNUSUAL BRUISING OR BLEEDING  TENDERNESS IN MOUTH AND THROAT WITH OR WITHOUT PRESENCE OF ULCERS  *URINARY PROBLEMS  *BOWEL PROBLEMS  UNUSUAL RASH Items with * indicate a potential emergency and should be followed up as soon as possible. If you have an emergency after office hours please contact your primary care physician or go to the nearest emergency department.  Please call the clinic during office hours if you have any questions or concerns.   You may also contact the Patient Navigator at [phone] should you have any questions or need assistance in obtaining follow up care.      Resources For Cancer Patients and their Caregivers ? American Cancer Society: Can assist with transportation, wigs, general needs, runs Look Good Feel Better.        [phone] ? Cancer Care: Provides financial assistance, online support groups, medication/co-pay  assistance.  1-800-813-HOPE 7702141826) ? [place] Assists [place] cancer patients and their families through emotional , educational and financial support.  305-538-2092 ? [place] DSS Where to apply for food stamps, Medicaid and utility assistance. (620) 431-6685 ? [facility]: Transportation to medical appointments. 408-533-1875 ? Social Security Administration: May apply for disability if have a Stage IV cancer. 925-641-5959 680-092-7392 ? [employer], Disability and Transit Services: Assists with nutrition, care and transit needs. (631)757-6348

## 2020-01-12 NOTE — Progress Notes (Signed)
1225 Labs,including Creatinine 3.03, reviewed with Dr. Dr. Delton Coombes and pt approved for Cytoxan infusion and Velcade injection per MD                                                                               Sophia Mcmahon tolerated Cytoxan and Velcade well without complaints or incident. Peripheral IV site checked by 2 RN's with positive blood return noted prior to and after Cytoxan infusion. VSS upon discharge. Pt discharged self ambulatory using her cane in satisfactory condition

## 2020-01-17 ENCOUNTER — Ambulatory Visit: Payer: Medicare Other

## 2020-01-19 ENCOUNTER — Ambulatory Visit (HOSPITAL_COMMUNITY): Payer: Medicare Other

## 2020-01-19 ENCOUNTER — Other Ambulatory Visit (HOSPITAL_COMMUNITY): Payer: Medicare Other

## 2020-01-23 DEATH — deceased

## 2020-01-26 ENCOUNTER — Ambulatory Visit (HOSPITAL_COMMUNITY): Payer: Medicare Other | Admitting: Nurse Practitioner

## 2020-01-26 ENCOUNTER — Ambulatory Visit (HOSPITAL_COMMUNITY): Payer: Medicare Other

## 2020-01-26 ENCOUNTER — Other Ambulatory Visit (HOSPITAL_COMMUNITY): Payer: Medicare Other

## 2020-01-31 ENCOUNTER — Ambulatory Visit (HOSPITAL_COMMUNITY): Payer: Medicare Other

## 2020-02-07 ENCOUNTER — Ambulatory Visit: Payer: Medicare Other

## 2021-04-30 IMAGING — CT CT ABD-PELV W/O CM
2 of 4 series · 16 of 46 positions shown, 18 images · non-contrast
Comparison: None.

CLINICAL DATA: Acute renal failure. Unable to visualize left kidney
on ultrasound.

EXAM:
CT ABDOMEN AND PELVIS WITHOUT CONTRAST
TECHNIQUE: Multidetector CT imaging of the abdomen and pelvis was performed
following the standard protocol without IV contrast.

[Series 3: abd/ pelvis 5.0 i30f 2 · axial · 0.94mm/px · z∈[+1020,+1435]mm · 13 of 91 slices shown, 15 images]
[im 4/91  soft-tissue]
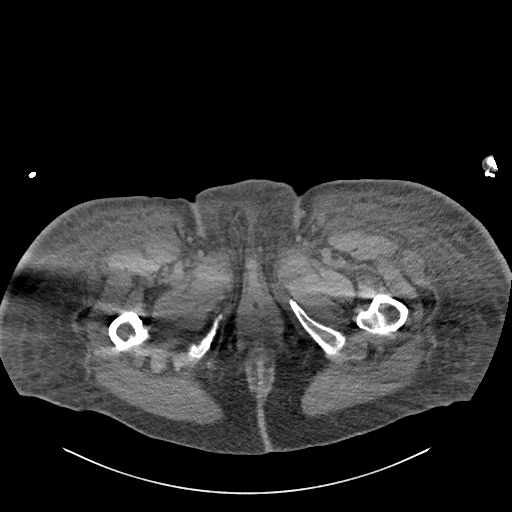
[im 4/91  bone]
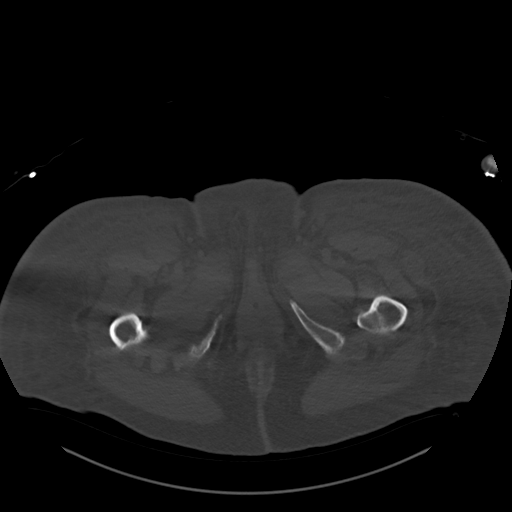
[im 12/91  soft-tissue]
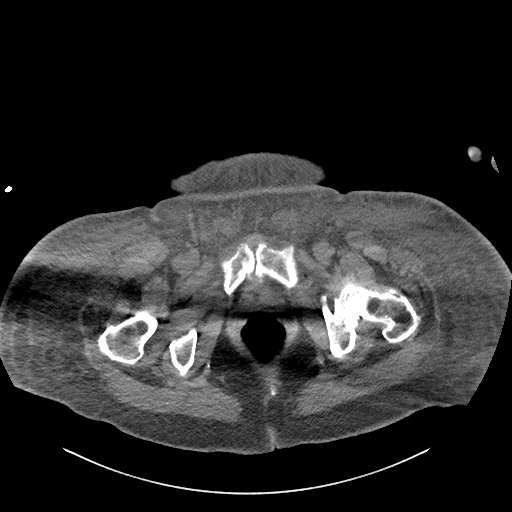
[im 20/91  soft-tissue]
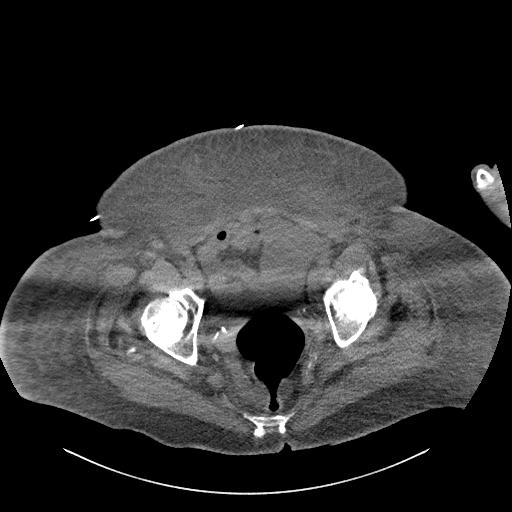
[im 24/91  soft-tissue]
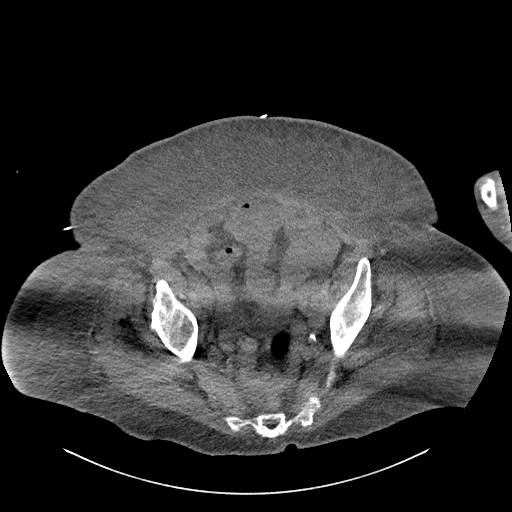
[im 32/91  soft-tissue]
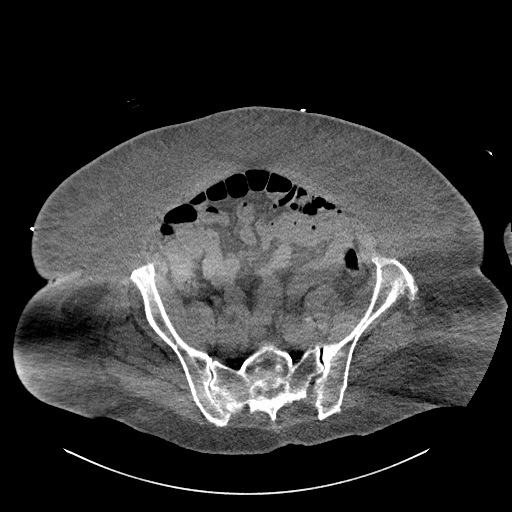
[im 40/91  soft-tissue]
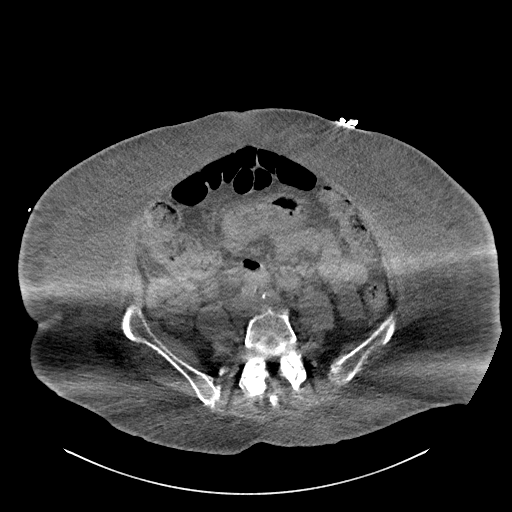
[im 47/91  soft-tissue]
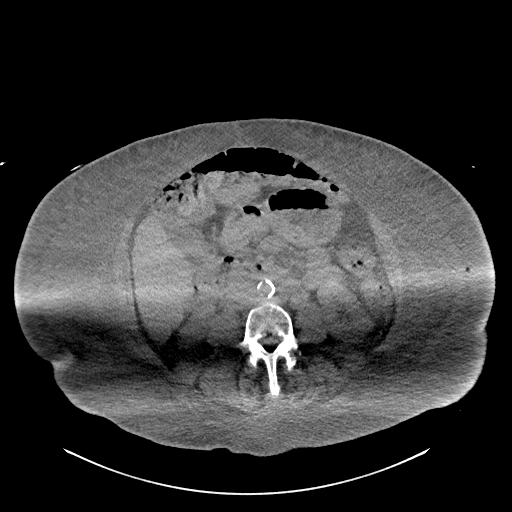
[im 51/91  soft-tissue]
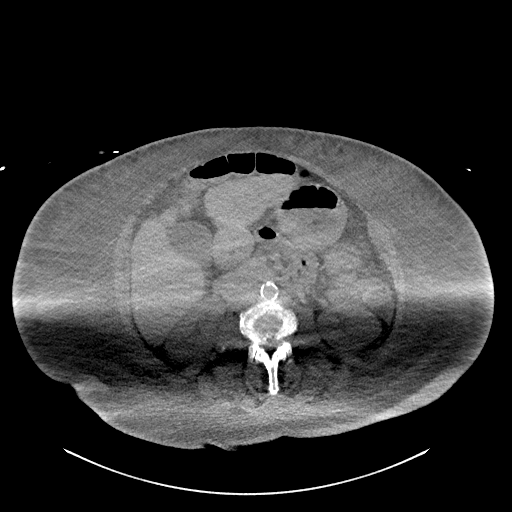
[im 59/91  soft-tissue]
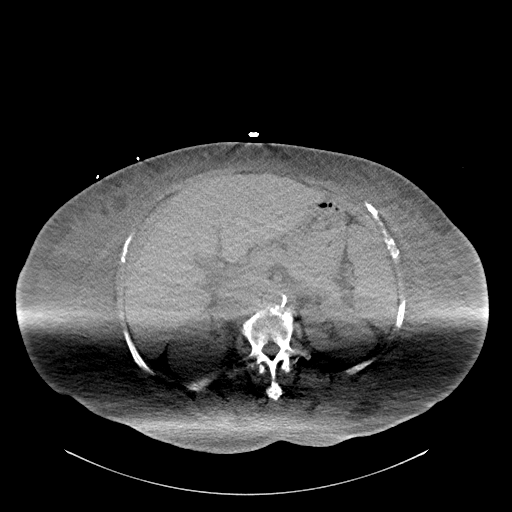
[im 59/91  bone]
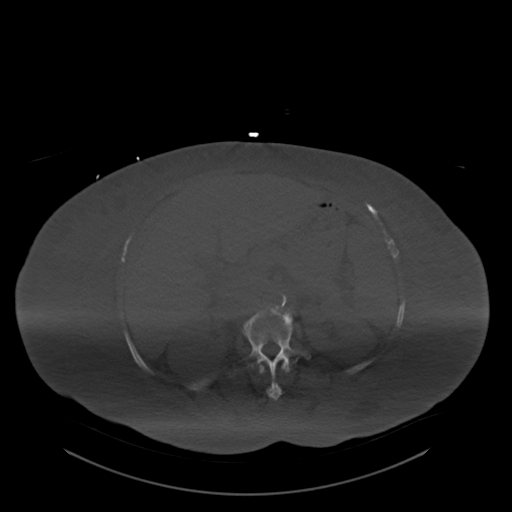
[im 67/91  soft-tissue]
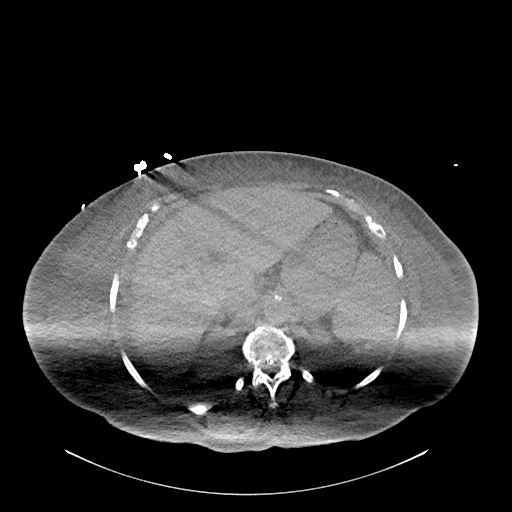
[im 71/91  soft-tissue]
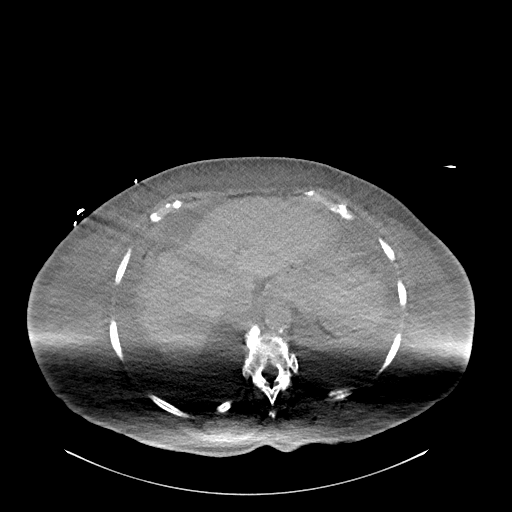
[im 79/91  soft-tissue]
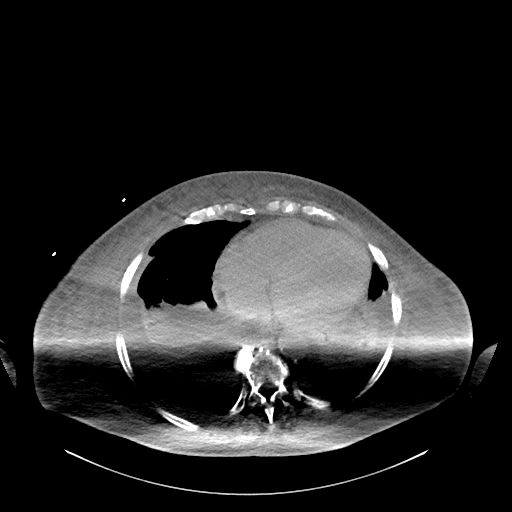
[im 87/91  soft-tissue]
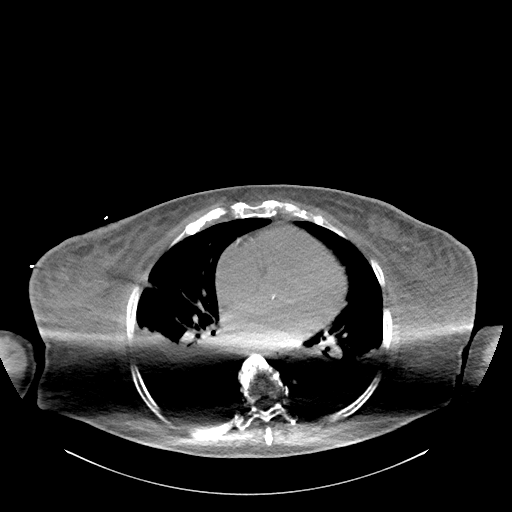

[Series 6: cor st · coronal · 0.83mm/px · 3 of 127 slices shown]
[im 43/127  soft-tissue]
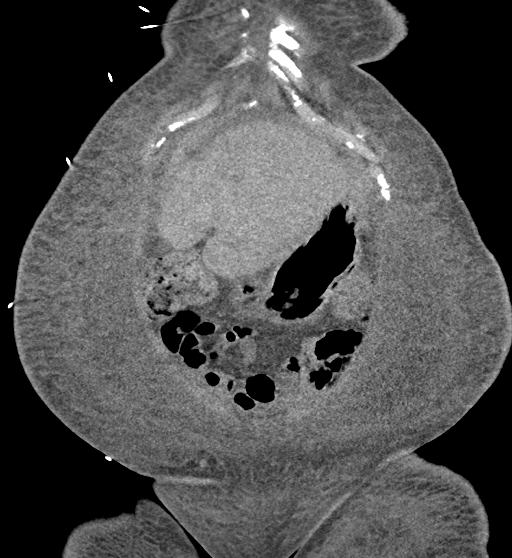
[im 57/127  soft-tissue]
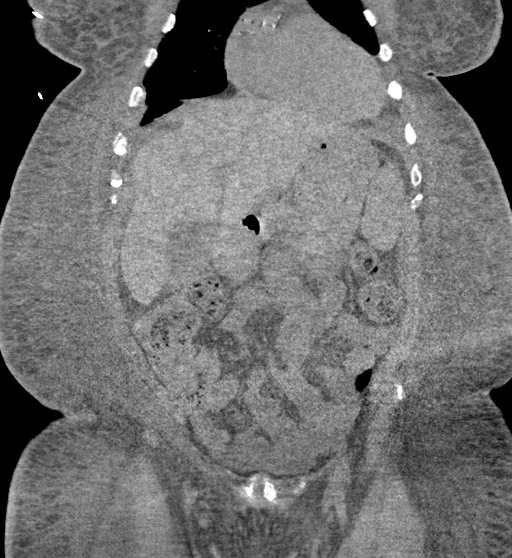
[im 71/127  soft-tissue]
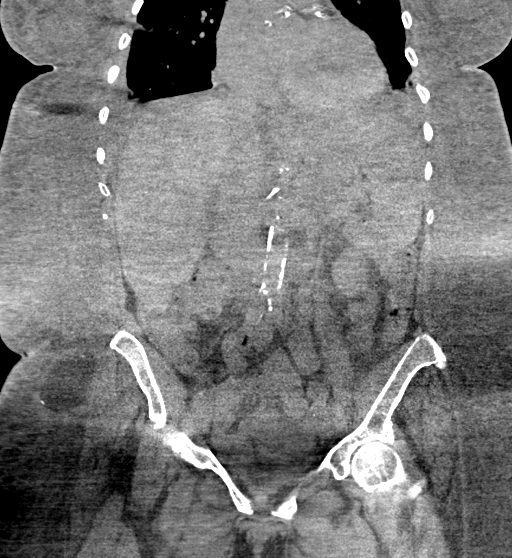

[16 of 46 positions shown; findings below may reference images not displayed]

FINDINGS: Artifact over the posterior field of view over the lower thorax and
upper abdomen possibly due to patient's arms.

Lower chest: Moderate size right effusion and small left effusion
with associated bibasilar atelectasis. Calcified plaque over the
left anterior descending and right coronary arteries. Calcified
plaque over the distal descending thoracic aorta. Generalized
subcutaneous edema over the thorax.

Hepatobiliary: Mild nodular contour to the liver. No focal liver
mass. Gallbladder and biliary tree are normal.

Pancreas: Normal.

Spleen: Normal.

Adrenals/Urinary Tract: Adrenal glands are normal. Kidneys are
normal in size without hydronephrosis. No definite nephrolithiasis.
Foley catheter present within a decompressed bladder.

Stomach/Bowel: Stomach and small bowel are normal. Colon is within
normal. Appendix is not well visualized.

Vascular/Lymphatic: Mild-to-moderate calcified plaque over the
abdominal aorta. No definite adenopathy.

Reproductive: Suggestion previous hysterectomy.

Other: No significant free fluid or focal inflammatory change.
Diffuse subcutaneous edema.

Musculoskeletal: Degenerative change of the spine and hips.
IMPRESSION: No acute findings in the abdomen/pelvis.

Moderate size right effusion and small left effusion with associated
bibasilar atelectasis.

Mild nodular contour to the liver which may be due to cirrhosis.

Aortic Atherosclerosis ([id number]). Atherosclerotic coronary
artery disease.
